# Patient Record
Sex: Female | Born: 1950 | Race: Black or African American | Hispanic: No | Marital: Married | Smoking: Former smoker
Health system: Southern US, Community
[De-identification: ages and names within clinical notes are randomized; demographics above are authoritative.]

## PROBLEM LIST (undated history)

## (undated) DIAGNOSIS — E785 Hyperlipidemia, unspecified: Secondary | ICD-10-CM

## (undated) DIAGNOSIS — C50912 Malignant neoplasm of unspecified site of left female breast: Secondary | ICD-10-CM

## (undated) DIAGNOSIS — C52 Malignant neoplasm of vagina: Secondary | ICD-10-CM

## (undated) DIAGNOSIS — I739 Peripheral vascular disease, unspecified: Secondary | ICD-10-CM

## (undated) DIAGNOSIS — Z720 Tobacco use: Secondary | ICD-10-CM

## (undated) DIAGNOSIS — I10 Essential (primary) hypertension: Secondary | ICD-10-CM

## (undated) DIAGNOSIS — Z86010 Personal history of colonic polyps: Principal | ICD-10-CM

## (undated) DIAGNOSIS — E0590 Thyrotoxicosis, unspecified without thyrotoxic crisis or storm: Secondary | ICD-10-CM

## (undated) DIAGNOSIS — E119 Type 2 diabetes mellitus without complications: Secondary | ICD-10-CM

## (undated) DIAGNOSIS — N183 Chronic kidney disease, stage 3 unspecified: Secondary | ICD-10-CM

## (undated) DIAGNOSIS — I779 Disorder of arteries and arterioles, unspecified: Secondary | ICD-10-CM

## (undated) DIAGNOSIS — E049 Nontoxic goiter, unspecified: Secondary | ICD-10-CM

## (undated) DIAGNOSIS — K219 Gastro-esophageal reflux disease without esophagitis: Secondary | ICD-10-CM

## (undated) DIAGNOSIS — I219 Acute myocardial infarction, unspecified: Secondary | ICD-10-CM

## (undated) DIAGNOSIS — N179 Acute kidney failure, unspecified: Secondary | ICD-10-CM

## (undated) DIAGNOSIS — Z87891 Personal history of nicotine dependence: Secondary | ICD-10-CM

## (undated) DIAGNOSIS — I2510 Atherosclerotic heart disease of native coronary artery without angina pectoris: Secondary | ICD-10-CM

## (undated) DIAGNOSIS — I4891 Unspecified atrial fibrillation: Secondary | ICD-10-CM

## (undated) DIAGNOSIS — Z951 Presence of aortocoronary bypass graft: Secondary | ICD-10-CM

## (undated) DIAGNOSIS — I150 Renovascular hypertension: Secondary | ICD-10-CM

## (undated) HISTORY — DX: Acute myocardial infarction, unspecified: I21.9

## (undated) HISTORY — DX: Type 2 diabetes mellitus without complications: E11.9

## (undated) HISTORY — DX: Presence of aortocoronary bypass graft: Z95.1

## (undated) HISTORY — DX: Malignant neoplasm of vagina: C52

## (undated) HISTORY — DX: Renovascular hypertension: I15.0

## (undated) HISTORY — DX: Personal history of nicotine dependence: Z87.891

## (undated) HISTORY — PX: TONSILLECTOMY: SUR1361

## (undated) HISTORY — DX: Atherosclerotic heart disease of native coronary artery without angina pectoris: I25.10

## (undated) HISTORY — DX: Nontoxic goiter, unspecified: E04.9

## (undated) HISTORY — DX: Personal history of colonic polyps: Z86.010

## (undated) HISTORY — DX: Acute kidney failure, unspecified: N17.9

## (undated) HISTORY — DX: Gastro-esophageal reflux disease without esophagitis: K21.9

## (undated) HISTORY — DX: Hyperlipidemia, unspecified: E78.5

## (undated) HISTORY — DX: Unspecified atrial fibrillation: I48.91

## (undated) HISTORY — DX: Peripheral vascular disease, unspecified: I73.9

## (undated) HISTORY — DX: Tobacco use: Z72.0

## (undated) HISTORY — DX: Chronic kidney disease, stage 3 (moderate): N18.3

## (undated) HISTORY — DX: Disorder of arteries and arterioles, unspecified: I77.9

## (undated) HISTORY — DX: Essential (primary) hypertension: I10

## (undated) HISTORY — DX: Chronic kidney disease, stage 3 unspecified: N18.30

## (undated) HISTORY — DX: Thyrotoxicosis, unspecified without thyrotoxic crisis or storm: E05.90

---

## 1981-07-02 DIAGNOSIS — C52 Malignant neoplasm of vagina: Secondary | ICD-10-CM

## 1981-07-02 HISTORY — DX: Malignant neoplasm of vagina: C52

## 1998-10-18 ENCOUNTER — Other Ambulatory Visit: Admission: RE | Admit: 1998-10-18 | Discharge: 1998-10-18 | Payer: Self-pay | Admitting: Internal Medicine

## 2000-07-02 DIAGNOSIS — I219 Acute myocardial infarction, unspecified: Secondary | ICD-10-CM

## 2000-07-02 HISTORY — DX: Acute myocardial infarction, unspecified: I21.9

## 2002-07-02 DIAGNOSIS — I2510 Atherosclerotic heart disease of native coronary artery without angina pectoris: Secondary | ICD-10-CM

## 2002-07-02 DIAGNOSIS — Z951 Presence of aortocoronary bypass graft: Secondary | ICD-10-CM

## 2002-07-02 HISTORY — DX: Presence of aortocoronary bypass graft: Z95.1

## 2002-07-02 HISTORY — DX: Atherosclerotic heart disease of native coronary artery without angina pectoris: I25.10

## 2002-09-22 ENCOUNTER — Inpatient Hospital Stay (HOSPITAL_COMMUNITY): Admission: EM | Admit: 2002-09-22 | Discharge: 2002-10-02 | Payer: Self-pay | Admitting: *Deleted

## 2002-09-23 HISTORY — PX: CARDIAC CATHETERIZATION: SHX172

## 2002-09-24 ENCOUNTER — Encounter: Payer: Self-pay | Admitting: Cardiothoracic Surgery

## 2002-09-24 HISTORY — PX: CORONARY ARTERY BYPASS GRAFT: SHX141

## 2002-09-25 ENCOUNTER — Encounter: Payer: Self-pay | Admitting: Cardiothoracic Surgery

## 2002-09-26 ENCOUNTER — Encounter: Payer: Self-pay | Admitting: Cardiothoracic Surgery

## 2002-09-27 ENCOUNTER — Encounter: Payer: Self-pay | Admitting: Surgery

## 2002-09-28 ENCOUNTER — Encounter: Payer: Self-pay | Admitting: Surgery

## 2002-09-29 ENCOUNTER — Encounter: Payer: Self-pay | Admitting: Cardiothoracic Surgery

## 2002-09-30 ENCOUNTER — Encounter: Payer: Self-pay | Admitting: Cardiothoracic Surgery

## 2002-10-01 ENCOUNTER — Encounter (INDEPENDENT_AMBULATORY_CARE_PROVIDER_SITE_OTHER): Payer: Self-pay | Admitting: Cardiology

## 2002-10-14 ENCOUNTER — Encounter: Payer: Self-pay | Admitting: Emergency Medicine

## 2002-10-15 ENCOUNTER — Encounter: Payer: Self-pay | Admitting: Emergency Medicine

## 2002-10-15 ENCOUNTER — Inpatient Hospital Stay (HOSPITAL_COMMUNITY): Admission: EM | Admit: 2002-10-15 | Discharge: 2002-10-16 | Payer: Self-pay | Admitting: Emergency Medicine

## 2002-10-15 ENCOUNTER — Encounter: Payer: Self-pay | Admitting: Neurology

## 2002-10-19 ENCOUNTER — Encounter (INDEPENDENT_AMBULATORY_CARE_PROVIDER_SITE_OTHER): Payer: Self-pay | Admitting: Specialist

## 2002-10-19 ENCOUNTER — Ambulatory Visit (HOSPITAL_COMMUNITY): Admission: RE | Admit: 2002-10-19 | Discharge: 2002-10-19 | Payer: Self-pay | Admitting: Oncology

## 2002-10-19 ENCOUNTER — Encounter: Payer: Self-pay | Admitting: Oncology

## 2003-02-11 ENCOUNTER — Encounter: Payer: Self-pay | Admitting: Cardiovascular Disease

## 2003-02-11 ENCOUNTER — Ambulatory Visit (HOSPITAL_COMMUNITY): Admission: RE | Admit: 2003-02-11 | Discharge: 2003-02-12 | Payer: Self-pay | Admitting: Cardiovascular Disease

## 2003-02-11 HISTORY — PX: RENAL ARTERY ANGIOPLASTY: SHX2317

## 2003-02-25 ENCOUNTER — Encounter: Payer: Self-pay | Admitting: Oncology

## 2003-02-25 ENCOUNTER — Ambulatory Visit (HOSPITAL_COMMUNITY): Admission: RE | Admit: 2003-02-25 | Discharge: 2003-02-25 | Payer: Self-pay | Admitting: Oncology

## 2003-04-13 ENCOUNTER — Ambulatory Visit: Admission: RE | Admit: 2003-04-13 | Discharge: 2003-04-13 | Payer: Self-pay | Admitting: Gynecologic Oncology

## 2003-08-17 ENCOUNTER — Ambulatory Visit (HOSPITAL_COMMUNITY): Admission: RE | Admit: 2003-08-17 | Discharge: 2003-08-18 | Payer: Self-pay | Admitting: Cardiovascular Disease

## 2003-12-20 ENCOUNTER — Encounter: Admission: RE | Admit: 2003-12-20 | Discharge: 2003-12-20 | Payer: Self-pay | Admitting: Cardiovascular Disease

## 2003-12-23 ENCOUNTER — Ambulatory Visit (HOSPITAL_COMMUNITY): Admission: RE | Admit: 2003-12-23 | Discharge: 2003-12-24 | Payer: Self-pay | Admitting: Cardiovascular Disease

## 2003-12-23 HISTORY — PX: CARDIAC CATHETERIZATION: SHX172

## 2008-10-12 ENCOUNTER — Emergency Department (HOSPITAL_COMMUNITY): Admission: EM | Admit: 2008-10-12 | Discharge: 2008-10-12 | Payer: Self-pay | Admitting: Emergency Medicine

## 2010-06-08 ENCOUNTER — Encounter
Admission: RE | Admit: 2010-06-08 | Discharge: 2010-06-08 | Payer: Self-pay | Source: Home / Self Care | Attending: Internal Medicine | Admitting: Internal Medicine

## 2010-07-05 ENCOUNTER — Encounter
Admission: RE | Admit: 2010-07-05 | Discharge: 2010-07-05 | Payer: Self-pay | Source: Home / Self Care | Attending: Internal Medicine | Admitting: Internal Medicine

## 2010-11-17 NOTE — Discharge Summary (Signed)
[name], [name]                           ACCOUNT NO.:  [id number]   MEDICAL RECORD NO.:  [id number]                   PATIENT TYPE:  INP   LOCATION:  3034                                 FACILITY:  [facility]   PHYSICIAN:  Alyson Locket. Love, M.D.                 DATE OF BIRTH:  1951/04/07   DATE OF ADMISSION:  10/14/2002  DATE OF DISCHARGE:  10/16/2002                                 DISCHARGE SUMMARY   REASON FOR ADMISSION:  This was one of several [facility] admission  for this 60 year old, left-handed, black, married female from [place],  [place], admitted from the emergency room for evaluation of slurred  speech and fixed high gaze.   HISTORY OF PRESENT ILLNESS:  The patient has a history of hypertension for  six years, hyperlipidemia, and was admitted 09/22/2002 with a two-week  history of chest pain and had an abnormal EKG.  Cardiac catheterization  showed evidence of three-vessel disease with 90 to 95% stenosis of all three  main coronary artery vessels.  She had preserved left ventricular function  with an ejection fraction of 55%.  Carotid Dopplers prior to surgery showed  a 60 to 80% left ICA stenosis but otherwise no significant stenosis.  She  did have moderate right femoral reduction of flow and mild reduction of  arterial flow in the left femoral.  She underwent three-vessel coronary  artery bypass surgery 09/24/2002 with left internal mammary artery to LAD,  saphenous vein graft to obtuse marginal, saphenous vein graft to right  coronary artery.  She had some postop bleeding complications.  She was  discharged home for cardiac rehabilitation and had previously quit  cigarettes in January 2004 and alcohol in January 2004.   At home on Sunday, the day prior to admission, she complained of some  decreased hearing of the right ear and tinnitus, but apparently this was an  old complaint.  On Monday, she was noted to have some slurred speech and  episodes in  which her eyes appeared to be glazed.  There was no associated  seizure activity o focal weakness.  She did complain of some shortness of  breath.  She had some coughing episodes and decreased appetite.  She was  brought to the emergency room late on the evening of 10/14/2002 and admitted.  CT scan showed evidence of old bicerebral small vessel ischemic strokes, and  neurology was asked to see.   PAST MEDICAL HISTORY:  1. Hypertension for six years.  2. Hyperlipidemia.  3. Peripheral vascular disease with claudication in the right leg.  4. Left ICA stenosis of 60 to 80% documented by preop Dopplers.  5. Right renal artery stenosis.  6. History of cervical cancer, though initially the family had told me     ovarian cancer.  7. Status post partial hysterectomy.  8. COPD.   MEDICATIONS:  1. Her medications had  been changed recently, and a Catapres patch had been     started on Saturday, a TTS 0.2 mg.  2. Zocor 40 mg daily.  3. Lasix 40 mg daily.  4. K-Dur 20 mEq daily.  5. Nexium 40 mg daily.  6. Albuterol inhaler 2 puffs 4 times a day.  7. Lopressor 25 mg 1/2 t.i.d.  8. Altace 5 mg daily.  9. Aspirin 325 mg daily.  10.      Plavix 75 mg daily.   ALLERGIES:  No known history of allergies.   SOCIAL HISTORY:  She was educated through high school but has been slow  according to her sister who helped her through most of her educational needs  in high school.  The patient has been working in [employer] in  [employer].  She stopped cigarettes in January 2004 an  alcohol in January 2004.  She was smoking one-half pack per day cigarettes  before she stopped.  The amount to alcohol is not clear but at least one  beer per day according to her husband.  This is her second marriage.   PHYSICAL EXAMINATION ON ADMISSION:  VITAL SIGNS:  Normal.  NEUROLOGIC:  She was alert but seemed distant.  She would give her name.  She was oriented x 3 but initially said the  year was 7.  She knew the  president but not the vice president or governor.  She followed commands.  Cranial nerve examination was normal.  She did have a retinal examination,  bilateral cotton wool exudates.  There was no focal asymmetry, and hearing  was present with air conduction greater than bone conduction.  Tongue was  midline, and gags were present.  Motor examination revealed that she moved  all extremities, and sensory examination was intact.  Deep tendon reflexes  were 2+ with absent ankle jerks, and plantar responses were downgoing.  GENERAL:  Unremarkable.  LUNGS:  Decreased breath sounds at both bases.  CARDIOVASCULAR:  Heart sounds normal without a murmur.  ABDOMEN:  I could not palpate initially the mass in the left pelvis, but  this could be palpated through the abdominal wall on the left side.   LABORATORY DATA:  Sodium 138, potassium 3.8, chloride 103, CO2 content 28,  BUN 6, creatinine 0.8, glucose 118.  White blood cell count 6800, hemoglobin  9.5, hematocrit 29.0, platelets 532,000.  Pro time 16.4, INR 1.4.  SGOT  elevated at 115 with an SGPT of 92.  Sodium 135, potassium 3.9, chloride  106, CO2 content 26, BUN 6, creatinine 0.7, and glucose 121 on repeat.  Urinalysis showed 3 to 6 white blood cells and 0.2 red blood cells.   CT scan of the brain showed multiple small vessel disease ischemic strokes.   EKG showed normal sinus rhythm with low-voltage QRS, prolonged QT, and some  question of inferior ischemia.   Repeat pro time in hospital revealed an INR of 1.4, PTT 36.  Ammonia level  was 14.   A 2-D echocardiogram showed left ventricular size normal, overall left  ventricular function at lower limits of normal, left ventricular ejection  fraction estimated at 50 to 55%.  There was mild mitral valvular  regurgitation.  There was a small left pleural effusion.  CT scan of the abdomen and pelvis was remarkable for bilateral pleural  effusions and thickening  of the pericardium which could be related to her  CABG.  The liver was inhomogeneous which was worrisome for diffuse  hepatocellular disease and even the possibility of metastatic disease.  There was a large mass in the left retroperitoneal region, possibly  representing adenopathy and a small amount of pelvic ascites.   Chest x-ray showed cardiomegaly, mild bilateral edema, bilateral basilar  atelectasis and/or infiltrate.   Portable chest x-ray showed improved congestive heart failure, small  effusion, and basilar atelectasis.   Telemetry showed normal sinus rhythm in hospital.   HOSPITAL COURSE:  The patient was seen in the emergency room, history of  Catapres patch placement begun on Saturday was noted, and this was  discontinued.  History was possible for transient ischemic attack or stroke,  but also quite possible for Catapres reaction.  CT scan of the abdomen and  pelvis to look at the elevated liver function tests revealed the left pelvic  mass, and the patient was seen in consultation by the interventional  radiology and also by Dr. Julieanne Manson, hematology/oncology.  Although  initial history had been from the family of that of an ovarian cancer, it  was determined that she most likely had a cervical cancer.  It was decided  that she should stop her aspirin and Plavix and undergo biopsy of this  lesion on Monday, 10/19/2002.  Clonidine was held, and she returned to normal  according to her family.   IMPRESSION:  1. Dysarthria (Code 784.5).  2. Confusion (Code 298.9).  3. Bicerebral small vessel ischemic strokes on CT scan (Code 433.31).  4. Left internal carotid artery stenosis, 60 to 80% on Doppler studies (Code     433.10).  5. Peripheral vascular disease with right leg claudication (Code 440.21).  6. Hypertension (Code 796, 2).  7. Hyperlipidemia (Code 273.4).  8. Chronic obstructive pulmonary disease with recent discontinuation of     cigarettes in January 2004  (Code 496).  9. Right renal artery stenosis.  10.      Elevated liver function tests (Code unknown).  11.      Pelvic mass, rule out lymphoma, Hodgkin's disease, or recurrence of     cervical cancer.  12.      History of cervical cancer.   DISCHARGE MEDICATIONS:  1. Zocor 40 mg daily.  2. Lasix 40 mg daily.  3. K-Dur 20 mEq daily.  4. Nexium 40 mg daily.  5. Albuterol inhaler 2 puffs four times a day.  6. Lopressor 25 mg 1/2 tablet t.i.d.  7. Altace 5 mg daily.   She is not to be on any aspirin or Plavix until after biopsy 10/20/2002.   DIET:  Low sodium.   ACTIVITY:  She is not to drive a car.   FOLLOW UP:  She is to return to see me in one month and to see Dr. Benay Spice  based on biopsy results.                                               Alyson Locket. Erling Cruz, M.D.    JML/MEDQ  D:  10/16/2002  T:  10/16/2002  Job:  [id number]   cc:   Almira Coaster. Fischer, M.D. [address]  [place]  [place]  [phone]  Fax: [phone]   Richard A. Rollene Fare, M.D.  [phone] [address] [place] [address]  Fax: [place] Benay Spice, M.D.  [place]. [place]  [place]  [phone]  Fax: [phone]

## 2010-11-17 NOTE — Discharge Summary (Signed)
[name], [name] NO.:  [id number]   MEDICAL RECORD NO.:  [id number]                   PATIENT TYPE:  INP   LOCATION:  2034                                 FACILITY:  [facility]   PHYSICIAN:  Ivin Poot, M.D.               DATE OF BIRTH:  27-Sep-1950   DATE OF ADMISSION:  09/22/2002  DATE OF DISCHARGE:  10/02/2002                                 DISCHARGE SUMMARY   PRIMARY ADMITTING DIAGNOSIS:  Chest pain.   ADDITIONAL DISCHARGE DIAGNOSES:  1. Severe three vessel coronary artery disease.  2. Unstable angina.  3. Hypertension.  4. Hyperlipidemia.  5. Peripheral vascular disease and claudication.  6. Right renal artery stenosis.  7. History of tobacco abuse.  8. Postoperative bleeding.  9. Postoperative anemia.   PROCEDURES PERFORMED:  1. Cardiac catheterization.  2. Coronary artery bypass grafting x3 (left internal mammary artery to the     LAD, saphenous vein graft to the circumflex, saphenous vein graft to the     PDA).  3. Endoscopic vein harvest, right thigh, with open vein from his right lower     leg.  4. Placement of intra-aortic balloon pump intraoperatively.  5. Mediastinal reexploration for postoperative bleeding.   HISTORY:  The patient is a 60 year old black female with a history of  hypertension who over the past several weeks prior to this admission has had  intermittent episodes of chest pain.  She was referred by her primary care  physician to Dr. Terance Ice for further evaluation.  She initially  was scheduled for a stress test, but due to an abnormal EKG this was changed  to a Persantine Cardiolite study.  She developed tightness in her face and  jaw, as well as her chest, during this study with signs of ST depression.  She continued to have pain; therefore, she was given nitroglycerin which  resolved her pain immediately.  Because of her abnormal test, she was felt  to warrant cardiac catheterization and was  admitted to [facility]  to proceed with the same.   HOSPITAL COURSE:  She was started on IV heparin and nitroglycerin and  remained pain free.  She was taken to the cath lab on 09/23/2002, where she  underwent cardiac catheterization.  This showed significant three vessel  coronary artery disease.  Due to the diffuse nature of her disease and her  small target vessels, it was felt that she would be a good candidate for  percutaneous intervention.  She was referred for cardiothoracic surgery  consultation.  She was seen by Dr. Tharon Aquas Trigt, and he was in agreement  that she should proceed with surgical revascularization.  She remained  stable and was taken to the operating room on 09/24/2002, where she underwent  CABG x3 with the above-noted grafts.  She tolerated the procedure well and  was weaned off cardiopulmonary bypass without difficulty;  however, at the  completion of the procedure while her chest was being closed, her blood  pressure decreased and hovered around the 50 range systolic despite  aggressive therapy.  Her chest was reopened, and she was emergently placed  back on cardiopulmonary bypass.  An intra-aortic balloon pump was also  placed.  Her grafts were reexamined, and all were found to be in good  condition.  She was able to be weaned off bypass the second time with stable  cardiac output.  She had no EKG changes during this time period.  She was  taken to the SICU in stable condition.  Postoperatively, she was noted to  have increased drainage from her chest tube from between 200 to 250 mL per  hour.  Her initial coagulation studies were mildly abnormal.  Chest x-ray  showed some mediastinal widening.  It was felt that she should be returned  to the operating room for exploration.  Upon exploration, she was noted to  have a bleeding vessel in the pericardial fat, as well as some intermittent  bleeding from the chest wall.  All these areas were cauterized, and   hemostasis was achieved.  Her grafts were again reexamined, and all the  anastomoses were hemostatic.  She was transfused two units of packed red  blood cells intraoperatively for a hemoglobin of 7.0.  Once again, she was  transferred to the SICU at completion of the procedure in stable condition.  She was initially maintained on __________ and dopamine drips.  She remained  hemodynamically stable, and late on the day of postoperative day #2, she was  weaned from the ventilator and extubated.  Also her drips were weaned and  discontinued.  Late in the day on postoperative day #2, her intra-aortic  balloon pump was also able to be removed.  She was noted to be quite volume  overloaded and was started on diuresis.  Her chest tubes were removed by  postoperative day #3, and she was ready to be transferred to the floor.  She  has otherwise done well postoperatively.  She complained of some mild  difficulty swallowing and underwent and esophagram, as well as an evaluation  by speech pathology.  She had mild esophageal dysmotility, and otherwise was  negative.  Her dysphagia was resolved at the present, and she is tolerating  a regular diet without difficulty.  She has also been working with physical  therapy, cardiac rehab phase I on ambulation, and was doing quite well.  Also in the initial postoperative period, she was started on Cipro for a  cough and fever of 101.  This was resolved.  Her chest x-ray most recently  has shown only atelectasis.  She was completing a course of Cipro by  10/02/2002, and her cough was improved.  Her surgical incision sites were all  healing well.  Her blood pressure has remained fairly stable after the  addition of Altace to her regimen.  She has also been started on Zocor for  hyperlipidemia.  She is still quite volume overloaded, and presently at  least 15 pounds above her preoperative weight with significant lower extremity edema.  She is continuing to diurese  well, and this will need to  be continued in the outpatient setting.  It is felt that if she continues to  remain stable, she will be ready for discharge home on 10/02/2002.   DISCHARGE MEDICATIONS:  1. Enteric-coated aspirin 325 mg daily.  2. Lopressor 25 mg t.i.d.  3. Altace 5 mg daily.  4. Catapres TTS patch 0.4 mg per 24 hours q 7 days.  5. Zocor 40 mg daily.  6. Lasix 40 mg daily x2 weeks.  7. K-Dur 20 mEq daily x2 weeks.  8. Nexium 40 mg daily.  9. Ultram 50 to 100 mg q.4h. p.r.n. for pain.   DISCHARGE INSTRUCTIONS:  She is to refrain from driving, heavy lifting, or  strenuous activity.  She may continue a low-fat, low-sodium diet.  She is  asked to shower daily and clean her incisions with soap and water.  Home  health nursing has been arranged for cardiac restorative care, and to remove  her staples in one week.    DISCHARGE FOLLOW UP:  She was asked to make an appointment to see Dr.  Rollene Fare in two weeks.  She will then follow up with Dr. Prescott Gum on  Friday, 10/30/2002, at 11:45 a.m.  She is asked to have a chest x-ray at  [facility] one hour prior the this appointment, and to  bring her films to the office visit.  She is asked to call if she  experiences any problems or has questions in the interim.     Suzzanne Cloud, P.A.                        Ivin Poot, M.D.    GC/MEDQ  D:  10/01/2002  T:  10/03/2002  Job:  [id number]   cc:   Delfino Lovett A. Rollene Fare, M.D.  [phone] [address]  [place]  [place] [address]  Fax: [place], [place]

## 2010-11-17 NOTE — H&P (Signed)
[name], [name]                           ACCOUNT NO.:  [id number]   MEDICAL RECORD NO.:  [id number]                   PATIENT TYPE:  INP   LOCATION:  3034                                 FACILITY:  [facility]   PHYSICIAN:  Alyson Locket. Love, M.D.                 DATE OF BIRTH:  10-01-1950   DATE OF ADMISSION:  10/15/2002  DATE OF DISCHARGE:                                HISTORY & PHYSICAL   REASON FOR ADMISSION:  This is one of several [facility] admissions  for this 60 year old left-handed black married female from [place], [place], admitted from the emergency room for evaluation of slurred speech  and fixed eye gaze.   HISTORY OF PRESENT ILLNESS:  The patient has a history of hypertension for  six years, elevated lipids in the past, and was admitted September 22, 2002 with  a two-week history of chest pain to the cardiology service.  She had an  abnormal EKG at that time and underwent cardiac catheterization showing  three-vessel disease with 90-95% stenosis of all three main coronary artery  vessels, with preserved LV function by 2-D echocardiogram, ejection  fractions of 55%.  She also had Doppler studies of her peripheral vascular  system showing moderate right femoral reduction of flow and mild reduction  of arterial flow in the left femoral.  She had Doppler studies of the left  internal carotid artery showing 60-80% stenosis and other vessels looking  well.  She underwent three-vessel coronary artery bypass surgery, September 24, 2002, with internal mammary artery to her LAD, saphenous vein graft to the  circumflex obtuse marginal, and saphenous vein graft to right coronary  artery.  She was discharged home for cardiac rehab and had previously quit  cigarettes, January of 2004, and alcohol in January of 2004, prior to that  admission.  At home, she was doing very well, walking, getting around and  doing well and suddenly complained of decreased hearing out of her  right ear  and tinnitus; apparently, this is an old complaint.  Monday, she was noted  to have slurred speech and has had episodes of appearing eye-glazed with her  eyes since that time.  There has been no focal weakness or seizure noted.  She has been up walking.  She has required some assistance in taking care of  her toilet needs and dressing herself.  She has complained of shortness of  breath and pain in her back with coughing.  She has had decreased appetite  and been agitated at night.  She was brought to the emergency room, where a  CT scan showed bicerebral small vessel ischemic strokes and neurology was  asked to see her.  She was not taking her Lopressor at home, and this was  restarted on Saturday, and she was not taking her Catapres patch TTS-2,  which was also started on Saturday.  PAST MEDICAL HISTORY:  Her past medical history is significant for  hypertension for six years, hyperlipidemia, peripheral vascular disease with  claudication in her right leg, left internal carotid artery 60-80% stenosis,  right renal artery stenosis, history of ovarian cancer, status post partial  hysterectomy, and COPD.   MEDICATIONS:  1. Catapres patch TTS 0.2 mg placed on Saturday for the first time, having     not taken it for over a week.  2. Zocor 40 mg daily.  3. Lasix 40 mg daily.  4. K-Dur 20 mEq daily.  5. Nexium 40 mg daily.  6. Albuterol inhaler two puffs four times per day.  7. Lopressor 25 mg a half b.i.d. to a half t.i.d.  8. Altace 5 mg daily.  9. Aspirin 325 mg daily.  10.      Plavix 75 mg daily.   ALLERGIES:  She has no known history of allergies.   EDUCATION:  She finished high school but was a slow learner and her sister  did most of her work in school, according to the sister.   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  Cigarettes she stopped in  January of 2004; she smoked a half a  pack per day before that time.  Alcohol  she stopped in January of 2004 and had approximately a beer per day prior to  that time.   FAMILY HISTORY:  Her father died at 29 from cancer.  Mother died at 23.  She  has sisters 57 and 75 and a brother 63, living and well.  She has two  children, sons, 104 and 6, living and well.   PAST SURGICAL HISTORY:  Operations in the past have included ovarian cancer,  operation greater than 10 years ago, a partial hysterectomy greater than 10  years ago.  She has had no other hospitalizations and no other history of  injuries.   PHYSICAL EXAMINATION:  GENERAL/VITAL SIGNS :  Examination revealed a well-  developed black female with blood pressures in right and left arms of  120/60, heart rate 75 and temperature was 98.6.  There were no bruits.  She  was afebrile.  NEUROLOGIC:  Mental status:  She seemed distant.  She would give her name.  She is oriented x3, except she initially stated the year was 35.  She knew  the president but not the vice president or governor.  She followed  commands.  Her cranial nerve examination revealed visual fields full.  She  had bilateral cotton-wool exudates in her eyes.  Her disks were flat.  Her  extraocular movements were full.  The pupils reacted from 4 to 3  bilaterally.  Corneals were present and facial sensation was equal.  There  was no facial motor asymmetry.  Hearing was present, air conduction greater  than bone conduction.  Tongue midline.  Uvula midline.  Gags were present.  Sternocleidomastoid and trapezius testing normal.  Motor examination  revealed she moved all extremities.  There was no drift.  She had sensory  examination intact to pinprick, light touch, joint position and vibration  but poor two-point discrimination in her hands.  Deep tendon reflexes were  2+ with absent ankle jerks and plantar responses downgoing. HEENT:  Examination revealed the tympanic membranes to be clear.  The mouth  was in  good repair.  LUNGS:  The lungs revealed decreased breath sounds at the bases.  HEART:  The heart was without murmurs.  ABDOMEN:  Bowel sounds were normal.  There was no enlargement of the liver  or spleen or kidneys.   LABORATORY DATA:  Initial sodium 138, potassium 3.8, chloride 103, CO2  content 28, BUN 6, creatinine 0.8, glucose 118.  White blood cell count  6800, hemoglobin 9.5, hematocrit 29.0, platelets 532,000.  Pro time 16.4  with an INR of 1.4.  Sodium 135, potassium 3.9, chloride 106, CO2 content  26, BUN 6, creatinine 0.7, glucose 121 on another set of electrolytes.  SGOT  elevated at 115, SGPT elevated at 92.  Urinalysis with 3 to 6 white blood  cells, 0 to 2 red blood cells.   CT scan showed multiple small vessel disease and ischemic strokes.   EKG showed normal sinus rhythm, low-voltage QRS and prolonged Q-T; T waves  raised the question of inferior and anterior ischemia.   IMPRESSION:  1. Confusion, code 298.9.  2. Slurred speech, code 784.5.  This may represent a metabolic problem     rather than stroke, because of elevated liver function tests and also the     recent placement of a Catapres patch which coincided the symptoms.  3. Bicerebral small vessel ischemic strokes on  CT scan, code 433.31.  4. Left internal carotid artery 60-80% stenosis, code 433.10.  5. Peripheral vascular disease with right leg claudication, code 440.21.  6. Hypertension, code 796.2.  7. Hyperlipidemia, code 272.4.  8. Chronic obstructive pulmonary disease, code 496.  9. Right renal artery stenosis.   PLAN:  Plan at this time is to obtain a serial ammonia, discontinue Catapres  patch and obtain a CT scan of the abdomen and pelvis.                                               Alyson Locket. Erling Cruz, M.D.    JML/MEDQ  D:  10/15/2002  T:  10/15/2002  Job:  [id number]

## 2010-11-17 NOTE — Op Note (Signed)
NAMESYLAR, JANSEN NO.:  [id number]   MEDICAL RECORD NO.:  [id number]                   PATIENT TYPE:  INP   LOCATION:  2307                                 FACILITY:  [facility]   PHYSICIAN:  Ivin Poot III, M.D.           DATE OF BIRTH:  July 31, 1950   DATE OF PROCEDURE:  09/25/2002  DATE OF DISCHARGE:                                 OPERATIVE REPORT   OPERATION:  Mediastinal re-exploration for bleeding, standpoint coronary  artery bypass graft x3.   PREOPERATIVE DIAGNOSIS:  Mediastinal bleeding following coronary artery  bypass graft x3.   POSTOPERATIVE DIAGNOSIS:  Mediastinal bleeding following coronary artery  bypass graft x3.   SURGEON:  Ivin Poot, M.D.   ANESTHESIA:  General.   INDICATIONS:  The patient is a 60 year old black female who on the evening  prior to  this operation underwent surgical revascularization for severe 3-  vessel coronary artery disease with marginal target vessels for grafting.  She required an intra-aortic balloon pump placement after experiencing  hemodynamic instability post bypass. When she returned to the intensive care  unit she was hemodynamically stable with minimal chest tube output.   However, after midnight her chest tube output increased to 200 to 250 cc an  hour on 3 consecutive hours without evidence of improving, and she was  returned to the operating room for re-exploration. The patient's family was  notified of the return to the operating room and the reasons for the re-  exploration.   DESCRIPTION OF PROCEDURE:  The patient was brought directly from the  intensive care unit to the operating room where she was again prepped and  draped and the previous sternal incision was opened. There was a moderate  amount of clotted and nonclotted blood in the mediastinum, especially on the  lateral aspect of the pericardium between the pericardium and the hilum of  the left lung.   There was  hematoma as well as bloody pleural effusion of the left pleural  space. The chest wall had some bleeding from a sternal wire at the area of  the xiphoid process. This was controlled with a transfixion suture of  Vicryl. There was some diffuse oozing in the mediastinal fat which was  controlled with electrocautery.   The anastomoses were all hemostatic. The bypass grafts were all patent. The  cannulation sites were hemostatic. The pericardial opening through which the  mammary artery pedicle was placed had some bleeding from a small vessel  which was cauterized and clipped. This was probably responsible for the  bleeding and the hematoma in the left side of the pericardium.   After checking again for any other areas of active bleeding and documenting  that hemostasis was adequate, the mediastinum was irrigated with warm  antibiotic irrigation and new chest tubes were placed and brought out  through  separate incisions. The pericardium was loosely reapproximated and the  sternum was reapproximated. The pectoralis fascia was closed with  interrupted #1 Vicryl and the subcutaneous and skin were closed with a  running Vicryl. Hemodynamics remained stable during chest closure. She was  returned to the ICU in stable condition.                                               Len Childs, M.D.    PV/MEDQ  D:  09/25/2002  T:  09/26/2002  Job:  [id number]

## 2010-11-17 NOTE — Consult Note (Signed)
[name], [name]                           ACCOUNT NO.:  [id number]   MEDICAL RECORD NO.:  [id number]                   PATIENT TYPE:  OUT   LOCATION:  GYN                                  FACILITY:  [facility]   PHYSICIAN:  John T. Clarene Essex, M.D.                 DATE OF BIRTH:  1951-01-24   DATE OF CONSULTATION:  04/13/2003  DATE OF DISCHARGE:                                   CONSULTATION   CHIEF COMPLAINT:  Pelvic cyst in the setting of prior hysterectomy.   HISTORY OF PRESENT ILLNESS:  The patient gives a history of partial  hysterectomy for early invasive or preinvasive cervical disease in the  1980's. Ovaries were retained and she relates no menopausal symptoms. She  did not receive a radical hysterectomy and did not receive any postoperative  radiation treatment. She had normal Pap smears through followup in [place]  where her surgery had been performed. She was hospitalized in April 2004 and  was found to have a cystic pelvic mass on CT scan. Cyst aspiration October 20, 2002 revealed benign cyst fluid.  Followup CT scan in August 2004 and an  ultrasound for evaluation of a right iliac stent revealed small persistent  left pelvic mass, approximately 3 1/2 x 4 1/2 cm on ultrasound. CT report  suggests that this has decreased in size from 5.0 x 6.5 cm to 5.7 x 3.6 cm.  There was no ascites or definitive perineal soft tissue implants. Of  interest, the radiologist noted that this most likely reflected local  recurrence of ovarian cancer as they had a history of ovarian cancer  registered into their system. The patient is concerned regarding the  etiology of her mass. She is currently asymptomatic with no pelvic pain or  dyspareunia. She denies unilateral back pain or leg swelling.   PAST MEDICAL HISTORY:  Complicated for severe atherosclerotic disease  including coronary artery disease, hypertension, renal artery stenosis,  external iliac stenosis. She is status post multivessel  CABG in March 2004  and has 95% right inferior renal artery stenosis treated with stent. She has  a history of peptic ulcer disease and is post hysterectomy as above.   CURRENT MEDICATIONS:  Lasix, Nexium, Altace, clonidine, Crestor, Plavix,  diclofenac, potassium supplementation, aspirin and Tylenol.   ALLERGIES:  None.   SOCIAL HISTORY:  Prior heavy smoker, currently nonsmoking. Married.   FAMILY HISTORY:  Noncontributory with ovarian cancer.   REVIEW OF SYMPTOMS:  Otherwise noncontributory, with no change in bowel or  bladder function and no other gynecologic or pelvic symptoms.   PHYSICAL EXAMINATION:  VITAL SIGNS:  Weight 175 pounds, blood pressure  162/92, pulse 72, respirations 18.  GENERAL:  The patient is alert, anxious and oriented x3 in no acute  distress. HEENT:  There is no pathologic lymphadenopathy.  LUNGS:  Fields are clear.  CHEST:  Chest wall has a midline  sternotomy incision that is well healed.  ABDOMEN:  Soft and benign with well healed small Pfannenstiel incision,  without hernia, ascites, mass or tenderness.  EXTREMITIES:  Scars from prior saphenous vein harvesting. There are  diminished pulses in the DP.  PELVIC:  External genitalia and BUS are normal to inspection and palpation.  The vagina is well supported with no significant cystocele or rectocele and  there are no vaginal lesions. Bimanual and rectovaginal examinations reveal  absent uterus and cervix. There is no adnexal or cul-de-sac mass palpable  although the patient guards voluntarily because of anal discomfort on  rectovaginal examination. There is certainly no cul-de-sac nodularity.   LABORATORY DATA:  Reviewed as outlined above. Of note, the CT description of  the mass describes it as a tubular structure.   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 Multiple medical comorbidities as outlined above.   RECOMMENDATIONS:  I would recommend that the patient be followed with either  pelvic ultrasound or CT scan at annual intervals and would not surgically  intervene unless the patient had a significant enlargement of this mass or  were developing significant symptoms such as pain. The development of a  significant solid component of the mass or changing morphology that would  suggest malignancy would also be a reason for exploration. Given her myriad  of physicians, I believe that this would best be managed through her primary  care physician. We would be glad to see her back at any time on a p.r.n.  basis.                                               John T. Clarene Essex, M.D.    JTS/MEDQ  D:  04/13/2003  T:  04/13/2003  Job:  [id number]   cc:   [place]. Rollene Fare, M.D.  [phone] [address], [address] [place] [address]  Fax: [place] Love, M.D.  [address] [place] [address]  Fax: Irene Benay Spice, M.D.  [place]. [place]  [phone]  Fax: [phone]   Caswell Corwin, R.N.  [phone] [address], [place] [address]

## 2010-11-17 NOTE — Cardiovascular Report (Signed)
[name], [name]                           ACCOUNT NO.:  [id number]   MEDICAL RECORD NO.:  [id number]                   PATIENT TYPE:  OIB   LOCATION:  6523                                 FACILITY:  [facility]   PHYSICIAN:  Richard A. Rollene Fare, M.D.          DATE OF BIRTH:  06/06/1951   DATE OF PROCEDURE:  02/11/2003  DATE OF DISCHARGE:                              CARDIAC CATHETERIZATION   PROCEDURE:  Retrograde abdominal aortic catheterization, abdominal aortic  angiogram midstream PA projection, bilateral iliac angiography PA  projection, selective right renal angiogram anterior and posterior division  accessory renal arteries, PTA and stent high grade posterior division  (inferior) right renal artery.   BRIEF HISTORY:  [name] is a 60 year old African-American married mother  of two with one grandchild.  She was a heavy smoker up until her recent  multivessel coronary artery bypass graft by Dr. Nadean Corwin September 24, 2002 for  severe three-vessel coronary disease with well preserved LV function.  At  that time, she had significant systemic hypertension as well and was found  to have a 95% right accessory inferior renal artery stenosis (posterior  division).  She has been followed on medical therapy and treated for  hypertension since her surgery.  She has discontinued smoking.  She has had  significant musculoskeletal discomfort but this is slowly improving.  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  She has  hyperlipidemia.  Found to have a pelvic mass which turned out to be a cyst  on evaluation including CT.  She is to follow up with Dr. Julieanne Manson  about this.  She has had prior elevated LFTs, on Zocor for hyperlipidemia.  LFTs were normal.  As an outpatient, at some point she was  switched to  Crestor and Niaspan was started and now she has elevated enzymes of three to  four times normal and her Crestor and Niaspan are currently being held.  She  is  asymptomatic with this without any significant symptoms of myopathy.  There was no history of recent or remote hepatitis.  She has had significant  exacerbation of her hypertension, however, prompting admission for  angiography and possible renal intervention for severe renal vascular  hypertension.   Informed consent was obtained to proceed and she was brought to the sixth  floor PV lab in postabsorptive state.  She was hydrated preoperatively.  Creatinine was 0.9, BUN 11.  Alkaline phosphatase 118, SGOT 118, SGPT 155.  CBC and diff, coags normal.   The right coronary artery was prepped, draped in the usual manner.  1%  Xylocaine was used for local anesthesia and the CRFA was entered with a  single anterior puncture using an 18 thin wall needle.  A 6 French short  sidearm sheath was inserted.  Exchange was done over 0.035-inch Wholey wire.  Abdominal aortic angiogram was done with a 5 [place] pigtail  catheter above  the level of the renal arteries at 20 ml, 20 ml per second.  A second  injection was done above the iliac bifurcation at the same setting.  The  patient was given 10 mg of Labetalol for significant systemic hypertension  with blood pressure of 220 mmHg.  Selective right renal angiography was done  at both the anterior and posterior division using a 5 French diagnostic  short right coronary catheter.   The patient was in sinus rhythm during the study.  She was given 3500 units  of heparin.  She was given 400 mcg of intra-arterial nitroglycerin.  Her  systolic pressure came down to 170 to 180 range.  Remained in sinus rhythm  and was stable.  Abdominal aortic angiogram revealed a patent celiac and SMA  axis.   The left renal artery was single and had no significant stenosis.   The superior right renal artery had 20% narrowing with good flow and was  approximately a 5 mm vessel.   The inferior renal artery was approximately a 4 mm vessel and had 95%  nonostial and proximal  mildly segmental stenosis similar to prior  angiography.  There was mild infrarenal atherosclerotic disease, but no  aneurysm or stenosis.  The iliac bifurcation was widely patent.  Common  iliacs were normal although short.  Hypogastrics were intact and the  external iliacs were long and widely patent bilaterally.  The SFA profunda  junctions were normal bilaterally.   The right renal artery was selectively catheterized with a diagnostic 6  French catheter and the posterior division (inferior branch).  The lesion  was crossed with a 0.014-inch stabilizer guide wire which was free in the  distal vessel and observed fluoroscopically throughout the procedure.   The lesion was dilated and stented with a 4 mm x 12-mm Genesis balloon  expandable stent premounted on a Cordis aviator balloon.  It was positioned  fluoroscopically just at the ostia and actually ended up about 1-2 mm inside  the ostia on multiple projections including oblique.  It was initially  dilated at 70-30.  The balloon was pulled back and dilated at 10-30 and  repeated 12-30 across the ostia.   The procedure was done through a 6 [place] short right 4 guiding catheter  which was exchanged over the stabilizer guide wire for the diagnostic  catheter.  The dilatation system was removed and final injections were done  through the guiding catheter.  This showed stenosis reduction of 90-95% to  0%.  There was approximately 10% narrowing just beyond the ostia and nipple  to the artery proximal to the stent, but this was widely patent with no  dissection in the stented area which had the high grade was widely patent  with 0% residual stenosis.  There was good flow to both renal arteries.  The  dilatation system was removed.  The side arm sheath was flushed.  Labetalol  total of 20 mg IV was given during the procedure to control blood pressure. He was transferred to the holding area for ACT measurement, subsequent  sheath removal and  pressure hemostasis in stable condition.   We will plan to keep her overnight to follow up renal function.  We will  hold her Niaspan and Crestor.  She has a followup with Dr. Julieanne Manson  about her past pelvic mass which they believe  was cystic nonmalignant  mass in the past evaluation.   We will get hepatitis serologies to rule out  any other causes of elevated  LFTs, keep her off Niaspan, Crestor at present.  If her LFTs normalize she  can be considered for another trial of Zocor therapy since she tolerated  this in the past.  If not, then she may need GI and hepatology consultation.   Continued medical therapy of her hypertension.  Hopefully, this will improve  post intervention.   CATHETERIZATION DIAGNOSES:  1. Severe hypertension, recent re-exacerbation with known right inferior     renal artery stenosis and renal vascular hypertension.  2. Successful percutaneous transluminal angioplasty and stent inferior     accessory right renal artery.  3. Prior coronary artery bypass graft x3 Dr. Tharon Aquas Trigt September 25, 2002     for severe three-vessel calcific disease with well preserved left     ventricular function.  4. Chronic obstructive pulmonary disease.  5. Past cigarette abuse.  6. Pelvic mass.  Evaluation by Dr. Benay Spice felt to be nonmalignant cyst.  7. Hyperlipidemia.  8. Arthritis.  9. Musculoskeletal chest pain postoperatively.  10.      History of bradycardia on clonidine and past Catapres reaction     resolved.  11.      Negative followup Cardiolite January 22, 2003.                                                  Richard A. Rollene Fare, M.D.    RAW/MEDQ  D:  02/11/2003  T:  02/12/2003  Job:  [id number]   cc:   Gwenlyn Perking, M.D.  [address]  [place]  [place] [address]  Fax: [place]. Benay Spice, M.D.  [place]. [place]  [phone]  Fax: [place] Love, M.D.  [address] [place] [address]   Fax: [phone]   Len Childs, M.D.  [address]  [place]  [place] [address]  Fax: [phone]

## 2010-11-17 NOTE — Consult Note (Signed)
Elaine, [name]                           ACCOUNT NO.:  [id number]   MEDICAL RECORD NO.:  [id number]                   PATIENT TYPE:  INP   LOCATION:  3034                                 FACILITY:  [facility]   PHYSICIAN:  Izola Price. Benay Spice, M.D.              DATE OF BIRTH:  10/09/1950   DATE OF CONSULTATION:  10/15/2002  DATE OF DISCHARGE:  10/16/2002                                   CONSULTATION   REASON FOR CONSULTATION:  Pelvic mass.   REFERRING PHYSICIAN:  Alyson Locket. Love, M.D.   HISTORY OF PRESENT ILLNESS:  The patient is a 60 year old woman with history  of CAD status post CABG September 24, 2002, hypertension, PVD, hyperlipidemia,  right renal artery stenosis, history of ETOH and tobacco abuse and  postoperative anemia admitted October 15, 2002, with slurred speech and  confusion.  Brain CT showed small vessel chronic ischemic changes, deep  cerebral white matter and age indeterminate lacunar infarct left external  capsule.  Admission lab work showed a hemoglobin of 9.5, white blood cell  count 6.8, platelet count ___________. PT 16.4.  SGOT 115, SGPT 92, total  protein 6.5, albumin 3.0, calcium 8.2.  Abdominal/pelvic CT demonstrated a  mass in the left retroperineum extending to the left iliac lymph node chain  measuring approximately 7 cm, inhomogeneous appearing liver worrisome for  diffuse hepatocellular disease.  Oncology consultation was requested for  further evaluation.   PAST MEDICAL HISTORY:  1. Questionable history of cervical cancer in 1980s, status post partial     hysterectomy and possible laser surgery.  2. CAD status post CABG September 24, 2002.  3. Hypertension.  4. Peripheral vascular disease.  5. Hyperlipidemia.  6. Right renal artery stenosis.  7. Postoperative anemia.  8. History of ETOH and tobacco abuse.   MEDICATIONS:  1. Albuterol MDI two puffs q.i.d.  2. Aspirin 325 mg daily.  3. Plavix 75 mg daily.  4. Lasix 40 mg daily.  5. Lopressor 12.5 mg  b.i.d.  6. Protonix 80 mg daily.  7. K-Dur 40 mEq daily.  8. Altace 5 mg daily.  9. Zocor 40 mg daily.   ALLERGIES:  No known drug allergies.   FAMILY HISTORY:  Father deceased unknown cause.  Mother with hypertension.  Sister and brother with diabetes mellitus and one sister who is healthy.   SOCIAL HISTORY:  The patient lives in [place].  She is  married.  She has two sons.  She is employed as a Training and development officer for [employer].  She has a positive tobacco history and quit smoking three months  ago.  Per her sister, she also has a long history of ETOH use which also  ended in January of 2004.  She has not had a blood transfusion.   REVIEW OF SYSTEMS:  The patient has had some weight loss.  Appetite has been  poor.  She  denies any fever or sweats.  She has  been experiencing some  dysphagia since her CABG.  She denies any problems with her bowels.  She has  had no urinary problems.  She denies any abdominal pain.   PHYSICAL EXAMINATION:  GENERAL APPEARANCE:  African American female in no  acute distress.  VITAL SIGNS:  Temperature 98.7, heart rate 70, respiratory rate 20, blood  pressure 137/70, oxygen saturation 99% on room air.  HEENT:  Normocephalic and atraumatic.  Pupils are equal, round and reactive  to light. Sclerae are anicteric.  Oropharynx is clear.  LYMPHS:  Shotty left posterior cervical adenopathy; possible bilateral  inguinal adenopathy, right greater than left.  CHEST:  Breath sounds are diminished at the bases bilaterally.  CARDIOVASCULAR:  Regular rate and rhythm.  ABDOMEN:  Soft and nontender.  EXTREMITIES:  No clubbing, cyanosis, or edema.  NEUROLOGIC:  Alert.  She responds slowly to questions but is appropriate.   LABORATORY DATA:  Hemoglobin 9.4, MCV 88, white count 5.9, platelets  484,000. Sodium 135, potassium 3.9, BUN 6, creatinine 0.7, glucose 121.  Total bilirubin 0.8, alkaline phosphatase 81, SGOT 115, SGPT 92, total  protein  6.5, albumin 3.0, calcium 8.2.  Ammonia 14.  PTT 36, PT 16.5/1.4.   Pre-CABG lab work revealed hemoglobin 13.4, white count 9.7, platelets  269,000.  Normal LFTs.   Peripheral blood smear (reviewed by Dr. Benay Spice):  1. Platelets - increased in number.  2. White blood cells - few atypical lymphs with cytoplasmic villae.  3. Red blood cells - moderate number of ovalocytes; moderate degree     variation in size; few teardrops; no schistocytes.   IMPRESSION:  1. Mental status change, dysarthria - improved; possibly related to     clonidine.  2. Recent coronary artery bypass graft surgery.  3. Anemia secondary to #2.  4. Bilateral pleural effusions secondary to #2.  5. History of alcohol use.  6. History of tobacco use.  7. Remote history of gynecologic malignancy, possibly cervical cancer.  8. Elevated PT - question if related to malnutrition, polypharmacy, liver     disease.  9. Left pelvic mass/adenopathy.  10.      Questionable right inguinal and left posterior cervical adenopathy.  11.      Elevated liver enzymes (normal prior to coronary artery bypass     grafting), questionable cirrhosis on CT.   DISCUSSION:  The pelvic mass/adenopathy is concerning for malignancy.  Dr.  Benay Spice cannot relate her current presentation to the CT scan findings.  The differential diagnosis includes recurrent cervical cancer, non-Hodgkin's  lymphoma, Hodgkin's disease, another gynecological malignancy,  gastrointestinal tumor.   RECOMMENDATIONS:  1. CT guided biopsy of left iliac adenopathy - core biopsies for routine     histology and lymphoma panel.  2. Vitamin K, follow-up PT on October 16, 2002.  3. Neurological evaluation per Dr. Erling Cruz.  4. Consider further work-up for liver disease pending the lymph node biopsy     result.  5. CT of neck/chest pending biopsy result.   We will continue to follow with you.  The patient was seen and examined by Dr. Benay Spice; chart, labs, peripheral  blood  smear, x-rays were reviewed.     Ned Card, N.P.                         Izola Price. Benay Spice, M.D.    LT/MEDQ  D:  10/16/2002  T:  10/16/2002  Job:  [id number]

## 2010-11-17 NOTE — Op Note (Signed)
[name], [name]                           ACCOUNT NO.:  [id number]   MEDICAL RECORD NO.:  [id number]                   PATIENT TYPE:  OIB   LOCATION:  2859                                 FACILITY:  [facility]   PHYSICIAN:  Richard A. Rollene Fare, M.D.          DATE OF BIRTH:  Nov 10, 1950   DATE OF PROCEDURE:  08/17/2003  DATE OF DISCHARGE:                                 OPERATIVE REPORT   PROCEDURE:  Retrograde abdominal aortic catheterization, abdominal aortic  angiogram, midstream P-A projection, bilateral selective renal angiography  by hand injection, bilateral iliac arteriography, bilateral lower extremity  runoff using digital subtraction angiography with step-table imaging,  cutting-balloon atherectomy and subsequent sandwich stent, high-grade in-  stent restenosis, right renal artery stent (inferior division), right  popliteal percutaneous transluminal angioplasty.   Ms. Zasada is a 60 year old African-American married mother of two with one  grandchild.  She was a heavy smoker up until her multivessel CABG by Dr.  Tharon Aquas Trigt, September 24, 2002, for three-vessel disease with preserved LV  function.  She was found at that time to have a 95% accessory inferior right  renal artery stenosis.  She underwent staged right renal artery PTA and  stenting June 13, 2003, with a 4-mm peripheral stent with good  angiographic result.  She is treated medically for hyperlipidemia,  hypertension.  She has right knee discomfort and has had prior back surgery,  and has had a nonmalignant pelvic mass, felt to be a cyst, evaluated by Dr.  Benay Spice.  She has a prior history of bradycardia and adverse allergic  dysarthric reaction to CATAPRES March 2004.  She is being evaluated now for  Doppler evidence of right renal artery restenosis associated with  exacerbation of her systemic hypertension on medical therapy and right lower  extremity claudication with RABI of 0.58, LABI of 0.94.   The  patient was escorted to the sixth floor PV Lab in a post-absorptive  state after 5-mg Valium p.o. premedication.  She was given intermittent  Nubain of 6 mg and Versed 2 mg total IV during the procedure.  The LCFA was  entered with a single anterior puncture using an 18 thin-walled needle and  additionally a short 5-French sheath was inserted.  Abdominal angiography  was done in the midstream P-A projection through a 5-French pigtail catheter  with DSA imaging above the level of the renal arteries at 20 cc, 20 cc per  second.  A second injection was done above the iliac bifurcation at the same  setting.  A 5-French short right coronary catheter was used for bilateral  selective renal angiography hand injection.  The pigtail catheter was  reinserted and bilateral lower extremity angiography was done at 88 cc, 8 cc  per second with DSA and step-table imaging to the feet bilaterally.   The patient tolerated the diagnostic procedure well.  Arterial pressures  were monitored throughout the procedure and ranged 140  to 160 mmHg.  The  patient remained in sinus rhythm.   Abdominal aortic angiogram demonstrated a patent left renal artery, but  there was some question of proximal left renal artery stenosis.  On  selective injection, the left renal artery was essentially normal with less  than 30% narrowing which was smooth and was single.   The right superior renal artery was widely patent and smooth.   The right inferior renal artery previously stented, supplying the posterior  division of the right kidney had in-stent restenosis of 80% diffusely from  the ostium to the distal portion of the stent.  The celiac and SMA axes were  intact.  The infrarenal abdominal aorta had no significant stenosis and the  IMA was present and intact.   The common iliacs were short and normal bilaterally.  The hypogastrics were  intact bilaterally.   The external iliac arteries were widely patent and smooth  bilaterally.   The SFA-profunda junctions were normal bilaterally.   The left SFA had 50% segmental narrowing in the proximal third and another  50% near Hunter's canal but good flow and no high-grade stenosis.  There was  evidence of diffuse atherosclerotic disease, noncritical.  The left  popliteal had no significant stenosis.   There was three-vessel runoff to the left foot which appeared fairly normal  with irregularities of the tibial trifurcation, but no high-grade stenosis  seen.   The right SFA-profunda was intact.  The proximal and mid right SFA had  irregularities without high-grade stenosis.  There was high-grade stenosis  of 95% with tandem, isolated lesions within this throughout the right  popliteal artery.   The right anterior tibial artery was totally occluded.  There were  geniculate collaterals that filled the proximal RAP, which then filled to  the right foot.  There was two-vessel runoff that was slow in the right foot  via the RPT and peroneal.   The patient was given 5000 units of heparin.  The sheath was upgraded to a 6-  [place] short side-arm sheath and the right inferior renal artery was  cannulated subselectively with a 6-French short JR-4 guiding catheter.  The  right renal artery was crossed with a stabilizer wire.  Cutting-balloon  atherectomy was performed with a 4.0 x 10 coronary Sci-Med cutting balloon  from the ostia to the distal portion of the stent at 10-40, 14-40, 4-40 and  14-40.  Following cutting-balloon atherectomy, there was area of residual  stenosis at the distal portion of the stent.  This was lightly dilated at 4  atmospheres and intra-arterial nitroglycerin was given, and this resolved  this stenosis.  There was, however, residual high-grade 95% stenosis at the  ostium of the right renal artery with elastic recoil with suboptimal cutting-  balloon atherectomy result in this area, so it was elected to proceed with underlapping sandwich  stenting.  Using exchange technique, a 4-mm x 12-mm  Cordis Genesis stent premapped on an Aviator balloon was positioned within  the prior stent to cover the ostium and protruded out approximately 2 mm  into the aorta.  It was deployed at 12-40, post-dilated at 5-40.  Final  result post-stenting was excellent with 0% stenosis at the ostia, less than  20% in the distal portion of the stent and just beyond it with good flow.   We then approached the right lower extremity in a contralateral fashion.  This was done with an IMA catheter, a Wooley wire and then a Terumo 6-French  cross-over sheath.  Injections were done through the cross-over sheath  selectively in the proximal, mid and distal right SFA, demonstrating no  significant disease up to the popliteal.   The popliteal artery had high-grade, 95% stenosis and segmental disease  throughout.  This ended just beyond the joint line but was in the flexion  area.   The lesion was crossed with the 0.035-inch Wooley wire under fluoroscopic  control.  It was then dilated with a 4-mm x 4-cm Cordis PowerFlex balloon at  5-40 and 6-40.  The balloon was then upgraded to a 5-mm x 6-cm Cordis  PowerFlex balloon and inflation was done at 6-40.  The high-grade tandem  stenoses in the mid popliteal did pop with the 4-mm balloon.   Final injections using DSA showed good angiographic result with less than  30% narrowing, no dissection and good flow throughout the popliteal.  There  was better antegrade flow; however, there were persistent collaterals to the  RAP from the supra-geniculate area.  The right TP and proximal peroneal and  RPT were intact with much more prompt, two-vessel runoff.   The sheath was pulled back.  The Wooley wire was removed.  The patient was  transferred to the holding area for sheath removal and pressure hemostasis.   She has had successful re-cutting-balloon atherectomy and restenting of her  high-grade right renal artery  stenosis for recurrent hypertension from prior  procedure of August 2004.   She has had successful right popliteal PTA alone for her subtotal, right  popliteal symptomatic stenosis with abnormal Dopplers and typical  claudication.   She will need to be followed with surveillance Dopplers and clinically for  possible restenosis in these areas.  Should she develop popliteal  restenosis, she might be a candidate for [place] atherectomy in this area.  We would certainly like to avoid stents because of the flexion of the  popliteal.   She also has arthritis of the right knee and is being evaluated  orthopedically.   CATHETERIZATION DIAGNOSES:  1. Atherosclerotic peripheral vascular disease, right lower extremity     claudication with abnormal Dopplers.  2. Successful right popliteal percutaneous transluminal angioplasty alone.  3. 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 4. Successful cutting-balloon atherectomy and subsequent sandwich stenting,     right renal artery.  5. Noncritical left superficial femoral artery disease with three-vessel     left lower extremity runoff.  6. Right anterior tibial occlusion with two-vessel right lower extremity     runoff.  7. Hyperlipidemia.  8. Neurovascular hypertension.  9. Chronic obstructive pulmonary disease, asthma.  10.      Arthritis of the knee.  11.      Nonmalignant pelvic cyst(s).  12.      Prior smoker, quit one year ago.  82.      Prior cerebrovascular accident with small-vessel disease, negative     carotid Dopplers.  14.      Prior elevated liver function tests on combination of Crestor and     Niaspan.  15.      Multivessel coronary artery bypass grafting, Ivin Poot, M.D.,     September 24, 2002.                                               Richard A.  Rollene Fare, M.D.    RAW/MEDQ  D:  08/17/2003  T:  08/17/2003  Job:  [id number]   cc:   Delfino Lovett A. Rollene Fare,  M.D.  [phone] [address], [place] [address]  Fax: [phone]   Sixth Floor PV LAB   Gwenlyn Perking, M.D.  [address]  [place]  [place] [address]  Fax: [place]. Benay Spice, M.D.  [place]. [place] [phone]  Fax: [phone]   Lawernce Keas, M.D.

## 2010-11-17 NOTE — Cardiovascular Report (Signed)
[name], [name]                           ACCOUNT NO.:  [id number]   MEDICAL RECORD NO.:  [id number]                   PATIENT TYPE:  INP   LOCATION:  3305                                 FACILITY:  [facility]   PHYSICIAN:  Richard A. Rollene Fare, M.D.          DATE OF BIRTH:  11-May-1951   DATE OF PROCEDURE:  09/23/2002  DATE OF DISCHARGE:                              CARDIAC CATHETERIZATION   PROCEDURE:  1. Retrograde central aortic catheterization, selective coronary angiography     post intracoronary administration.  2. Left ventricular angiogram, right anterior oblique/left anterior oblique     projection, subselective left internal mammary artery, right internal     mammary artery.  3. Abdominal aortic angiogram, midstream pulmonary artery projection.   DESCRIPTION OF PROCEDURE:  The patient was brought to the second floor CP  lab in a postabsorptive state after 5 mg Valium p.o. premedication.  The  right groin was prepped, draped in the usual manner.  Xylocaine, 1%, was  used for local anesthesia.  The RFA was entered with a single anterior  puncture using a #18 thin-walled needle, and a 6-French short Daig sidearm  sheath was inserted without difficulty.  The patient was on IV  nitroglycerin, IV Aggrastat on call to the laboratory.  IV heparin was held,  on call to the laboratory, ACT of 185 seconds.  Coronary angiography was  done with 6-French 4-cm taper, preformed coronary, and pigtail catheters.  IC nitroglycerin was given, 0.4 mg, with repeat injections obtained in  multiple projections.  Similarly, a right coronary angiography was done at  different magnifications and different projections because of the patient's  severe small coronary arteries.  LV angiogram was done in the RAO and LAO  projections at 25 cc/14 cc per second, 20 cc/12 cc per second.  Pullback  pressure of the CA was performed and showed no gradient across the aortic  valve.  Abdominal aortic angiogram  was done in the midstream PA projection  at 25 cc/20 cc per second.  Subselective left internal and right internal  mammary arteries were done with a right coronary catheter.  The catheter was  removed.  Sidearm sheath was flushed.  The patient tolerated the diagnostic  procedure well.  She was given 2 mg of Nubain and 2 mg of Versed for  sedation at the beginning of the procedure.  She was transferred to the  holding area for postoperative care in stable condition.   PRESSURES:  1. LV:  125/0, LVEDP 18 mmHg.  2. CA:  125/60.  3. There was no gradient across the aortic valve on catheter pullback.   ANGIOGRAPHIC DATA:  1. Fluoroscopy showed 2+ to 3+ calcification of all 3 coronary arteries.     There was no significant intracardiac or valvular calcification seen, but     there was mild annular calcification visualized.  2. LV angiogram in the RAO projection showed hypokinesis in the  mid to     anterolateral wall which was relatively mild.  In the LAO projection,     there was no significant wall motion abnormality.  EF approximately 55%     with no significant mitral regurgitation.  3. The LIMA was widely patent with no subclavian stenosis and good antegrade     left vertebral flow.  4. The RIMA was patent with no brachiocephalic stenosis and good antegrade     vertebral flow.  5. Abdominal aortic angiogram showed a single renal artery on the left with     approximately 30% narrowing.  On the right, the inferior accessory renal     artery had 95% stenosis in the ostium and proximal portion.  The superior     right renal artery had 40% narrowing.  6. The main left coronary was relatively short without any high-grade     stenosis, and there was associated calcification, and no catheter     damping.  7. The left anterior descending artery was diffusely diseased from its very     proximal portion before SP 1 and diagonal up to the mid portion with     approximately 80% segmental  narrowing and was a small vessel throughout.     It coursed to the apex of the heart where it bifurcated.  There was     another 80% to 90% narrowing of the LAD in the distal third before the     bifurcation, and there was probably 60% narrowing diffusely (estimate)     throughout the mid portion of the LAD.  There was good antegrade flow,     however.  8. The first diagonal was a thin branch with 95% segmental narrowing     throughout the proximal third, and it bifurcated.  The inferior branch     had a 90% narrowing.  9. The first marginal branch had tandem 80%, 90%, and 95% narrowing in a     large vessel, and the bifurcation had 90% and 95% bifurcation narrowing.     The distal bifurcation branches were of moderate size.  The mid     circumflex had approximately 75% narrowing segmentally and bifurcated     distally.  10.      The right coronary was a dominant vessel but diffusely diseased     throughout with probably 70% to 80% proximal stenosis, 80% at the acute     margin, and 80% and 90% beyond the acute margin; small thin PDA and PLA.   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  She was referred by Dr. Thea Silversmith for progressive  anginal symptoms.  There is a history of hyperlipidemia, hypertension, no  history of diabetes.  She has had several months of episodic chest pain with  exertion and lifting, and episodic nocturnal pain.  She has adjusted her  work schedule and gotten help at work to be able to go on with this.  She  finally came to the attention of Dr. Thea Silversmith who recommended initial  institution of medical therapy and aggressive evaluation, and she was sent  for a Cardiolite.  On Cardiolite testing, September 22, 2002, with Persantine  technique, she developed severe ST depression and essentially wiped out  her anterolateral wall.  Her pain resolved in the lab with medical therapy including sublingual nitroglycerin  and aminophylline to reverse the  Persantine.  She was admitted to the hospital, had borderline  elevation of  her troponin with no significant MB elevation, and EKG changes compatible  with LVH, and anterolateral  ST-T wave abnormalities without acute changes.   This patient has severe diffuse diabetic-type coronary arteriopathy and  advanced atherosclerotic disease.  She has diffuse disease of all 3 major  coronary arteries which are relatively small in the diseased portions  angiographically.  I believe she is a candidate, however, for multivessel  bypass surgery as the best form of therapy for her, and with aggressive  medical therapy which should include lipid-lowering and diabetic therapy, if  necessary.  Fortunately, she has well preserved LV function at this time,  and no other significant comorbid conditions at her young age of 49.   CATHETERIZATION DIAGNOSES:  1. Recent onset of progression of angina with early positive Cardiolite.  2. Systemic hypertension.  3. Hyperlipidemia.  4. Possible adult onset diabetes mellitus.  5. Severe 3-vessel diffuse coronary artery disease with well-preserved left     ventricular function.  6. Cigarette abuse, chronic bronchitis.                                               Richard A. Rollene Fare, M.D.    RAW/MEDQ  D:  09/23/2002  T:  09/23/2002  Job:  [id number]   cc:   Octavia Heir, M.D.  [phone] [address] [place] [address]  Fax: [phone]   Cardiac Catheterization Lab   Gwenlyn Perking, M.D.  [address]  [place]  [place] [address]  Fax: [phone]   Len Childs, M.D.  [address]  [place]  [place] [address]  Fax: [phone]

## 2010-11-17 NOTE — Cardiovascular Report (Signed)
NAMEMAYKALA, OCKER                           ACCOUNT NO.:  [id number]   MEDICAL RECORD NO.:  [id number]                   PATIENT TYPE:  OIB   LOCATION:  6522                                 FACILITY:  [facility]   PHYSICIAN:  Richard A. Rollene Fare, M.D.          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   Diagnostic catheterization was done through a 6 [place] Daig side arm sheath  that was placed in CRFA via modified Seldinger technique with a 18 thin-wall  needle single anterior puncture.  Catheters were 6 French 4 cm taper  performed coronary Cordis and pigtail catheters using Omnipaque dye.  The  patient was premedicated with 5 mg of Valium p.o. preoperatively, hydrated  preoperatively.  Glucophage was on hold.  Saphenous vein graft angiography  was done with right coronary catheter.  Selective LIMA angiography was done  with 6 [place] IMA catheter.  Selective right renal angiography was done with  a right coronary catheter using hand injections.  Pullback pressure of the  CA showed no gradient across the aortic valve and LV angiogram was done in  the RAO and LAO projections at 25 mL, 14 mL per second; 20 mL, 12 mL per  second respectively.  The patient tolerated the diagnostic procedure well.   PRESSURES:  1. LV:  180/0; LVEDP 16-18 mmHg.  2. CA:  180/90 mmHg.  3. There is no gradient across the aortic valve on catheter pullback.   Fluoroscopy showed 2+ calcification  diffusely of the right and left coronary  system.  No significant intracardiac or valvular calcification.  The right  renal stent was viewed fluoroscopically.   LV angiogram demonstrated hypokinesis of the mid anterior wall that was  mild.  Hypokinesis of the basilar and mid inferior and posterior apical  wall.  Estimated EF was approximately 50% with no significant MR.   Abdominal angiogram demonstrated single normal left renal artery.  Double  right renal artery.  The right superior renal artery was widely patent.  The  right inferior renal artery appeared stenosed at the origin, but this was  hidden because of projection.  The infrarenal abdominal aorta had very mild  atherosclerotic disease and was relatively small.  The common and external  iliacs bilaterally were patent with no significant stenosis and hypogastrics  were intact.  The CFAs bilaterally were intact and the right profunda SFA  junction was intact.  There appeared to be fairly good runoff down to this  level.   Selective right  renal angiogram revealed 90% in-stent ostial restenosis and  about 70% proximal restenosis of the previously placed right renal stent.   The main left coronary was small and short without significant stenosis.   The LAD was totally occluded in its proximal third after a large, but very  thin diagonal branch that had multiple collaterals forming.  This diagonal  was not jeopardized, but was very small.  The first septal perforator was  patent and large and also supplied collaterals to the circumflex and distal  right distribution.   The native circumflex artery showed 50% smooth narrowing just before the OM-  1 branch.  The OM-1 branch had competitive filling and was the grafted  marginal branch.  The circumflex proper had irregularities, but two  moderately large distal branches with no significant stenosis.  There were  collaterals from the distal native circumflex to the distal RCA.   The  native right coronary had diffuse disease from beginning to end,  probably greater than 70-80% with focal 95 and 99% in the mid portion.  Very  small PDA and PLA branches.   The saphenous vein graft to the RCA was widely patent with excellent  anastomosis to a very small distal RCA.  There was 80% and diffuse narrowing  beyond the insertion, but there was good filling through the graft to these  very small grafted vessel and tributaries.   Saphenous vein graft to the OM-1 branch was widely patent with an excellent  anastomosis.  The superior bifurcation was widely patent.  The inferior  bifurcation had a 90% stenosis in a very small less than 1 mm vessel.   The LIMA was widely patent through the mid LAD.  There was good flow, but  the LAD beyond the insertion was diffusely diseased with focal 80-85%  narrowing, but diffuse 70-80% narrowing throughout its course, but good  filling to the apex despite the small vessel.   DISCUSSION:  Mrs. 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  She has hypertension, hyperlipidemia, noninsulin  dependent diabetes.  She was previously a heavy smoker as well.  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  She underwent coronary  artery bypass graft x3 by Dr. Prescott Gum September 25, 2002.  Did have  reexploration postoperatively, but did well.  It was noted that she had  extremely small and diffusely diseased distal vessels at surgery.  She  recently had exacerbation and recurrence of angina.  Was treated as an  outpatient and underwent Cardiolite testing which was positive for anterolateral ischemia (December 13, 2003) with  associated ventricular ectopy.  She has also had elevated velocities in her right renal artery suggesting in-  stent restenosis.  She had previously had right renal artery PTA and  stenting on August 17, 2003 with 4 x 12 Genesis stent associated with  right popliteal PTA with 5 mm balloon for high grade 95% stenosis and  Fontaine 2b claudication.  She had occlusion of her right anterior tibial  with collaterals, but two-vessel runoff on the right and only 50% narrowing  seg mentally of her left SFA with three-vessel LLE runoff.  She has not had  recurrent claudication.  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   She has previously had elevated LFTs on niacin and Crestor.  She is off  niacin and LFTs have been normal.  She has stopped smoking as well.   The patient has patent grafts x3, but severe distal disease.  The distal  vessels are very small in RCA and OM-1 and the LAD and are not suitable for  intervention.  She has started to develop collaterals from her diagonals and  septal perforator branch and her native circumflex.  I believe Mrs. Knable  might benefit from EECP along with further maximization of her medical  therapy.   She has significant systemic hypertension on multiple medications which is  probably renal vascular in part and may be related to the restenosis of her  right inferior renal artery.  For that reason, we plan to proceed with renal  artery intervention.  Informed consent was obtained in the laboratory to  proceed with this.   The right renal artery was intubated with 6 [place] IMA catheter after the  patient had 3500 units of heparin IV and ACT was therapeutic.  The lesion  was crossed with 0.014 inch Asahi soft guide wire.  The in-stent restenosis  was dilated with a 4.0/10 coronary cutting balloon with inflations up to 10-  12 atmospheres.  The balloon was  pulled back and angiography showed  excellent result with 85-90% ostial and proximal in-stent restenosis reduced  to less than 20% with excellent flow.  Dilatation system was removed, side  arm sheath was flushed and the patient was transferred to the holding area  for sheath removal and pressure hemostasis.   CATHETERIZATION DIAGNOSES:  1. Arteriosclerotic heart disease, severe diffuse diabetic type three-     vessel disease treated with coronary artery bypass graft x3 September 25, 2002 by Dr. Tharon Aquas Trigt.  2. Recurrent angina and positive Cardiolite.  3. Widely patent left internal mammary artery, left anterior descending;     patent saphenous vein graft to OM-1; patent saphenous vein graft to     distal right coronary artery; severe distal disease beyond graft     insertions in all three vessels as outlined above  4. Well-preserved left ventricular function, segmental wall motion     abnormality, ejection fraction 50%. 5. Adult onset diabetes mellitus.  6. Hyperlipidemia.  7. Past heavy smoker.  8. Renal vascular hypertension status post right renal artery percutaneous     transluminal angioplasty and stent August 2004, redilatation for ISR     March 2005; second redilatation for ISR and elevated blood pressure December 23, 2003.  9. Arteriosclerotic peripheral vascular disease, Fontaine 2b claudication     and high grade right popliteal stenosis treated with percutaneous     transluminal angioplasty successfully March 2005.                                               Richard A. Rollene Fare, M.D.    RAW/MEDQ  D:  12/23/2003  T:  12/24/2003  Job:  [id number]   cc:   [facility] Fey, M.D.  [address], [address] [place], [place] [address]  Fax: [place]  Floyde Parkins, M.D.  [address]  [place]  [place] [address]

## 2010-11-17 NOTE — Discharge Summary (Signed)
[name], [name]                           ACCOUNT NO.:  [id number]   MEDICAL RECORD NO.:  [id number]                   PATIENT TYPE:  OIB   LOCATION:  6523                                 FACILITY:  [facility]   PHYSICIAN:  Richard A. [name], M.D.          DATE OF BIRTH:  1951/06/19   DATE OF ADMISSION:  02/11/2003  DATE OF DISCHARGE:  02/12/2003                                 DISCHARGE SUMMARY   DISCHARGE DIAGNOSES:  1. 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     a. Peripheral arteriogram with PTA and stent to the right renal artery.  2. Abnormal hepatic panel most likely related to Crestor and Niaspan. Those     have been discontinued.  3. Hyperlipidemia.  4. Pelvic cyst aspirated by Dr. Julieanne [name].  5. Coronary artery disease status post bypass grafting March 2004, out on     disability.   CONDITION ON DISCHARGE:  Improved.   PROCEDURES:  On February 11, 2003 peripheral arteriogram with PTA and stent to  the right renal artery for stenosis.   DISCHARGE MEDICATIONS:  1. No Niaspan, no Crestor.  2. Enteric-coated aspirin 81 mg daily.  3. Plavix 75 mg daily.  4. Protonix 40 mg daily.  5. Altace 20 mg daily.  6. Lopressor 25 mg three times a day.  7. Lasix 20 mg daily.  8. K-Dur 20 mEq daily.  9. Diclofenac 75 mg one twice a day.  10.      Have blood work done next Wednesday.   DISCHARGE INSTRUCTIONS:  1. No strenuous activity. No sexual activity. No lifting over 10 pounds for     three days then resume regular activity, but stay out on disability.  2. Low-fat, low-salt diet.  3. Wash cath site with soap and water. Call if any bleeding, swelling, or     drainage.  4. Have renal artery Dopplers done on second floor, Dr. Lowella [name] office     February 23, 2003 at 9 a.m.  5. Follow-up with Dr. Rollene [name] March 10, 2003 at 10:15.  6. Follow-up with Dr. Benay [name] as previously instructed.   HISTORY OF PRESENT ILLNESS:  A 60 year old  African-American female followed  by Dr. Rollene [name] with coronary artery disease and bypass grafting September 24, 2002. She had an incidental finding of 95% right renal artery stenosis at  that time. In April 2004 she had some mental status change, seen by  Dr. Katherina Right and it was felt that it was a reaction to Clonidine. At that time her  LFTs were slightly elevated. She did have a pelvic mass that turned out to  be cystic, but there were questions at the liver also and she was to have a  follow-up CT in the near future.   She presented February 11, 2003 as elected outpatient for peripheral  arteriogram and stent to the right renal artery.   On arrival to the  short stay unit she did have lab work down, which revealed  elevated LFTs and we discontinued her Crestor and Niaspan.   PAST MEDICAL HISTORY:  As stated.   CURRENT MEDICATIONS:  1. Metoprolol.  2. Crestor.  3. Altace.  4. Potassium.  5. Lasix.  6. Aspirin.  7. Niaspan as stated, now discontinued.   ALLERGIES:  No known drug allergies.   For social history, family history, review of systems, see H&P.   PHYSICAL EXAMINATION AT DISCHARGE:  GENERAL:  Alert and oriented female in  no acute distress.  VITAL SIGNS:  Blood pressure 130/60, pulse 52, respirations 22, oxygen  saturation on room air 99%.  CHEST:  Clear without rales, rhonchi, wheezes.  HEART:  S1, S2, regular rate and rhythm.  EXTREMITIES:  Right groin cath site is stable.   LABORATORY DATA:  Hemoglobin 12.9, hematocrit 39, platelets 256, WBC 9.4. At  that time she had a urinary tract infection, which was treated. On arrival  to the hospital sodium 140, potassium 4.0, chloride 106, CO2 30, glucose 98,  BUN 11, creatinine 0.9. Alkaline phos 118, SGOT 118, and SGPT 155. The SGOT  and SGPT were totally normal in April 2004. Her UA had cleared with only a  small amount of leukocytes. INR was 1.4, PTT 28. Hemoglobin 13, hematocrit  37.8. EKG revealed sinus bradycardia  rate of 48. RSR prime in v1.  Nonspecific T-wave abnormality. Peripheral vascular calves as stated. She  had 95% inferior renal artery stenosis on the right. She underwent PTA and  stent to that. She did have hypertension during the procedure and received  IV labetalol.   HOSPITAL COURSE:  The patient did well post procedures. Stayed in  observation overnight and by the morning of February 12, 2003 she was ready  for discharge. She was notified to stop the Crestor and the Niaspan. We will  recheck those in one week to see if they are coming back down. She will  follow up with Dr. Rollene [name] and Dr. Benay [name] as previously instructed. Dr.  Rollene [name] saw her and discharged her on the 13th.      Otilio Carpen. [place], N.P.                     Richard A. [name], M.D.    LRI/MEDQ  D:  02/12/2003  T:  02/13/2003  Job:  [id number]   cc:   Gwenlyn Perking, M.D.  [address]  [place]  [place] [address]  Fax: [phone]   Izola Price. [name], M.D.  [place]. [place]  [address]  Fax: [phone]   Cletis Athens, M.D.  [address]  [place]  [place] [address]  Fax: [phone]

## 2010-11-17 NOTE — Consult Note (Signed)
NAMEMERCED, TRENCHARD                           ACCOUNT NO.:  [id number]   MEDICAL RECORD NO.:  [id number]                   PATIENT TYPE:  INP   LOCATION:  3305                                 FACILITY:  [facility]   PHYSICIAN:  Ivin Poot III, M.D.           DATE OF BIRTH:  January 03, 1951   DATE OF CONSULTATION:  09/23/2002  DATE OF DISCHARGE:                                   CONSULTATION   REASON FOR CONSULTATION:  Unstable angina with severe three vessel coronary  artery disease.   PRIMARY CARE PHYSICIAN:  L[facility] on [employer].   CHIEF COMPLAINT:  Chest pain.   HISTORY OF PRESENT ILLNESS:  I was asked to evaluate this 60 year old black  female for potential surgical coronary revascularization for recent  diagnosis of severe three vessel coronary artery disease.  She presented to  Dr. Rollene Fare with symptoms of unstable angina and resting nocturnal angina.  A stress test was strongly positive and cardiac catheterization today showed  severe three vessel disease with high grade 90 to 95% stenosis of all three  main coronary vessels.  Her LV systolic function was well preserved with EF  of 55%.  Although she has diffuse disease and small coronary vessels that  are poor targets, she was felt to be a candidate for surgical coronary  artery revascularization.   PAST MEDICAL HISTORY:  1. Hypertension.  2. Hyperlipidemia.  3. History of smoking, quit January 2004.  4. Claudication with peripheral vascular disease of the right leg.  5. Right renal artery stenosis.   ALLERGIES:  No known drug allergies.   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  She works in [employer].  She stopped smoking two months  ago.  She is married and has several children and one grandchild.   FAMILY HISTORY:  No family history of premature coronary artery disease,  death or myocardial infarction.   REVIEW OF SYSTEMS:  No constitutional  symptoms, weight loss or fever.  ENT  review positive for dental plates.  Negative for recent change in vision,  headache, difficulty swallowing.  PULMONARY:  Negative for recent pneumonia,  productive cough or chest trauma.  CARDIOVASCULAR:  Positive for symptoms of  angina, negative for prior cardiac catheterization, prior myocardial  infarction, cardiac murmur or rheumatic heart disease.  GI:  Positive for  reflux esophagitis, better with Nexium.  GU:  Negative for hematuria or  kidney stone.  ENDOCRINE:  Negative for diabetes or thyroid disease.  ___________ review is negative for DVT, positive for right leg claudication,  negative for TIA.  NEUROLOGIC:  Negative for seizure, stroke or syncope.  PSYCHIATRIC:  Negative for depression, diminished appetite.  HEMATOLOGIC:  Negative for blood transfusion or bleeding disorder.   PAST SURGICAL HISTORY:  This is positive for a partial hysterectomy and she  apparently developed pelvic infection following that operation  which  resolved with drainage and antibiotics.   PHYSICAL EXAMINATION:  GENERAL APPEARANCE:  She is in the ICU step-down unit  following cardiac catheterization and is in no distress.  Her family is in  attendance during the consultation.  The patient is a middle-age black  female in no distress.  She is anxious.  VITAL SIGNS:  She is 5 feet 6 inches, weighs 130 pounds, blood pressure  120/60, pulse 80 and regular, respiratory rate 18.  HEENT:  Normocephalic, full EOM.  Dentition with missing front upper teeth  but no severe dental disease.  Pharynx is clear.  NECK:  No JVD, thyromegaly or mass.  Good range of motion of the neck.  LYMPHATICS:  No palpable, supraclavicular or axillary adenopathy.  LUNGS:  Clear.  CARDIOVASCULAR:  Regular rhythm without S3, gallop or murmur.  ABDOMEN:  Soft, nontender without pulsatile mass.  RECTAL:  Deferred.  EXTREMITIES:  No clubbing, cyanosis, or edema.  MUSCULOSKELETAL:  No spinal  deformity.  No atrophy.  VASCULAR:  There are 2+ pulses on the left pedal, nonpalpable pulses on the  right pedal.  Both radial arteries are palpable.  No veinous insufficiency  in the lower extremities.  NEUROLOGIC:  Alert and oriented x3, full motor function without focal  weakness.  SKIN:  Warm, clear and dry without rash or lesion.   LABORATORY DATA:  Cardiac catheterization shows she has severe diffuse  disease of her coronaries.  They are poor targets for grafting.   Her chest x-ray report is clear.   Her BUN and creatinine are normal.  Hematocrit is 38%.  INR is normal.   IMPRESSION AND RECOMMENDATIONS:  The patient has severe symptomatic angina  which occurs almost every night.  Fortunately, she has not had an myocardial  infarction  yet.  She is not a candidate for percutaneous therapy due to the  diffuse nature of her coronary disease.  Surgical revascularization will be  difficult due to the poor targets from her diffuse disease but her only  realistic option for any long-term benefit for survival.  I have discussed  the procedure in detail with the patient and her family including  alternatives to surgery and expressive outcome of those alternative  therapies.  She understands that the risk for this operation will be higher  than the normal risk due to diffuse nature of her coronary disease and she  is at increased risk for perioperative myocardial infarction, bleeding or  death.  She understands the implications for the surgery and agrees to  proceed with the operation which will be scheduled for the afternoon of  September 24, 2002.   Thank you for this consultation.                                               Len Childs, M.D.    PV/MEDQ  D:  09/23/2002  T:  09/24/2002  Job:  [id number]

## 2010-11-17 NOTE — Op Note (Signed)
NAMENALLELY, FADLEY NO.:  [id number]   MEDICAL RECORD NO.:  [id number]                   PATIENT TYPE:  INP   LOCATION:  2307                                 FACILITY:  [facility]   PHYSICIAN:  Ivin Poot III, M.D.           DATE OF BIRTH:  [birth year]   DATE OF PROCEDURE:  09/24/2002  DATE OF DISCHARGE:                                 OPERATIVE REPORT   PREOPERATIVE DIAGNOSES:  Class I unstable angina with severe three-vessel  coronary artery disease, diffuse small-vessel disease with poor targets.   POSTOPERATIVE DIAGNOSES:  Class I unstable angina with severe three-vessel  coronary artery disease, diffuse small-vessel disease with poor targets.   PROCEDURES:  1. Coronary artery bypass grafting x3 (left internal mammary artery to left     anterior descending coronary artery, saphenous vein graft to circumflex     marginal, saphenous vein graft to right coronary artery).  2. Placement of intra-aortic balloon pump.   SURGEON:  Ivin Poot, M.D.   ASSISTANT:  Mardene Celeste, R.N.   ANESTHESIA:  General by Leda Quail, M.D.   INDICATIONS:  The patient is a 60 year old black female who presented with  unstable angina and ruled out for MI.  Cardiac catheterization by Dr.  Rollene Fare demonstrated severe three-vessel disease and preserved LV  function.  She was felt to be a candidate for surgical coronary  revascularization, although at high risk due to her severe diffuse small-  vessel disease, which were poor targets for grafting.  However, the patient  had no other therapeutic options and was having nocturnal angina regularly.  I examined the patient in her hospital room following cardiac  catheterization and reviewed the results of the cardiac catheterization with  the patient and family.  I discussed with the patient the indications and  expected benefits of coronary bypass surgery for treatment of her coronary  artery disease.  I  discussed the alternatives to surgical therapy for  treatment of her coronary artery disease and the expected outcome of those  alternative therapies.  I discussed with the patient and her family the  major aspects of the proposed operation, including the choice of conduit for  grafting, the use of general anesthesia and cardiopulmonary bypass, the  location of the surgical incisions, and the expected postoperative hospital  recovery period.  I reviewed with the patient the risk to her of coronary  bypass surgery, including the risks of MI, CVA, bleeding, infection, blood  transfusion requirement, and death.  She understood that she would be at  high risk for this procedure due to her severe three-vessel diffuse disease  and suboptimal target vessels for grafting.  She agreed to proceed with the  operation as planned after our discussion under what I felt was an informed  consent.   OPERATIVE FINDINGS:  The patient's saphenous vein at the left thigh appeared  to be sclerotic and  not usable.  The saphenous vein was harvested from the  right thigh using endoscopic technique.  The left internal mammary artery  was small but had adequate flow.  The coronaries were severely and diffusely  diseased and were suboptimal targets for grafting.  Only 1 mm probes could  be passed through her vessels.  At the termination of the procedure while  the chest was closed, the patient developed significant hypotension which  did not respond to intervention, and the chest was reopened and she was  placed on bypass and a balloon pump was placed to wean off the patient off  bypass successfully and successfully close the chest.   DESCRIPTION OF PROCEDURE:  The patient was brought to the operating room and  placed supine on the operating table, where general anesthesia was induced  under invasive hemodynamic monitoring.  She remained stable.  The chest,  abdomen, and legs were prepped with Betadine and draped as  a sterile field.  A sternal incision was made as the saphenous vein was harvested from the  right leg.  The left internal mammary artery was harvested as a pedicle  graft from its origin at the subclavian vessel.  It was good vessel,  although small, but with adequate flow.  The sternal retractor was placed.  The pursestrings were placed in the ascending aorta and right atrium.  The  patient was cannulated and placed on bypass, cooled to 32 degrees.  Cardioplegia catheters were placed for both antegrade and retrograde  delivery of cold blood cardioplegia.  The coronary vessels were identified  for grafting and the mammary artery and vein grafts were prepared for the  distal anastomoses.  The patient was cooled to 28 degrees and as the aortic  crossclamp was applied, 600 mL of cold blood cardioplegia was delivered in  split doses between the antegrade aortic and retrograde coronary sinus  cardioplegia catheters.  There was a good cardioplegic arrest with septal  temperature dropping to less than 12 degrees.  Topical iced saline slush was  used to augment myocardial preservation, and a pericardial insulator pad was  used to protect the left phrenic nerve.   The distal coronary anastomoses were then performed.  The first distal  anastomosis was of the distal right.  This was a small, thickened vessel  with a sclerotic wall, and a 1 mm probe passed distally.  A reversed  saphenous vein was sewn end-to-side with running 7-0 Prolene with adequate  flow through the graft.  The second distal anastomosis was to the circumflex  marginal.  This was a 1.0 mm vessel by probe placement in the lumen.  It had  severe diffuse distal disease.  A reversed saphenous vein was sewn end-to-  side with running 7-0 Prolene with adequate flow through the graft.  Cardioplegia was redosed.  The third distal anastomosis was to the distal third of the LAD.  It was deeply intramyocardial proximally.  A 1 mm probe   passed through the lumen of the LAD.  The left internal mammary artery  pedicle was brought through an opening created in the left lateral  pericardium and was brought down onto the LAD and sewn end-to-side with a  running 8-0 Prolene.  There was adequate flow through the anastomosis after  removing the pedicle clamp on the mammary artery.  The mammary pedicle was  secured to the epicardium and the aortic crossclamp was removed.   The heart was cardioverted back a regular rhythm.  Using a  proximal  occluding clamp, two proximal vein anastomoses were placed on the ascending  aorta using a 4.0 mm punch and running 6-0 Prolene.  The partial clamp was  removed and the vein grafts were perfused.  Each had adequate flow, and  hemostasis was documented in the proximal and distal anastomoses.  The  patient was rewarmed and reperfused.  Temporary pacing wires were applied.  When the patient reached 37 degrees, she was weaned from cardiopulmonary  bypass without difficulty on low-dose dopamine.  Cardiac output and blood  pressure were stable.  Protamine was administered.  There was no adverse  reaction to the protamine.  The cannulas were removed.  The mediastinum was  irrigated with warm antibiotic irrigation.  The leg incision was irrigated  and closed in a standard fashion.  The pericardium was loosely  reapproximated.  Two mediastinal and a left pleural chest tube were placed  and brought out through separate incisions.  Sternal wires were placed.  When the sternal wires were tightened and the chest was closed, the patient  had an episode of blood pressure which did not reverse with the usual  therapeutic interventions.  The sternal wires were removed.  The sternal  retractor was replaced and internal cardiac massage was initiated.  The  patient was given a new dose of heparin and pursestrings were placed and the  patient was placed on bypass expeditiously.  The heart was emptied out and   rested.  The heart never stopped, and the patient never fibrillated.  There  were no apparent EKG ischemic changes which preceded this episode of  hypotension.  A femoral artery catheter was then placed in the left groin  and an intra-aortic balloon pump was placed percutaneously and secured to  the skin.  After resting the patient for over 40 minutes on bypass and  balloon pump counterpulsation, she was then weaned again after the  ventilator was optimized and her hemodynamic parameters remained stable  after weaning from bypass.  Protamine was again administered without adverse  reaction and the cannulas were removed.  The patient was observed for  several minutes before reclosing the chest and replacing the mediastinal  chest tubes.  The patient remained stable  after chest closure with cardiac output of 4 L on low to medium dose of  dopamine.  Total cardiopulmonary bypass time was 120 minutes for the initial bypass period and an additional 45 minutes after her period of instability.  Total crossclamp time was 45 minutes.                                               Len Childs, M.D.    PV/MEDQ  D:  09/24/2002  T:  09/26/2002  Job:  [id number]   cc:   Delfino Lovett A. Rollene Fare, M.D.  [phone] [address], [place] [address]  Fax: [phone]

## 2010-11-17 NOTE — Discharge Summary (Signed)
NAMEBENAY, SZUCH                           ACCOUNT NO.:  [id number]   MEDICAL RECORD NO.:  [id number]                   PATIENT TYPE:  OIB   LOCATION:  [id number]                                 FACILITY:  [facility]   PHYSICIAN:  Cyndia Bent, N.P.                  DATE OF BIRTH:  1950/09/30   DATE OF ADMISSION:  12/23/2003  DATE OF DISCHARGE:  12/24/2003                                 DISCHARGE SUMMARY   Ms. [name] came into the hospital because of new onset of chest pain.  She went on to have a dobutamine-Cardiolite.  It was positive showing  ischemia in the anterior and lateral areas.  Thus, it was decided she should  undergo cardiac catheterization.  She does have a history of coronary bypass  grafting x3.  She did have small vessel disease with poor targets at that  time.  However, she had had a postop Cardiolite and there was a lot of  change.  Thus, it was decided she needed to be admitted to undergo cardiac  cath.  This was performed on December 23, 2003, by Dr. Terance Ice.  All  her 3 grafts were patent, her LIMA to her LAD, her SVG to her circumflex,  and her SVG to her RCA; however, she had distal disease passed all 3 grafts,  85% in the LAD, 90% in the circumflex, and 80% in the RCA.  She did have  some collateral flow to her distal RCA.  She was also found to have in-stent  restenosis of her right renal stent.  Thus, she underwent cutting balloon  PCI for in-stent restenosis, reduced from 90 down to less than 20%.  Dr.  Rollene Fare recommended that she undergo  EECP as an outpatient.  On the  morning of December 24, 2003, she was stable, she had been up in her room and up  in the hall walking, the right groin was without any bruising or hematoma,  blood pressure was 104/54, heart rate of 48, respirations 18, and  temperature 98.8.   LABORATORY DATA:  Hemoglobin 10.8, hematocrit 31.1, platelets 330, WBC 7.  Her sodium was 138, potassium 3.7, BUN 8, creatinine 0.8, and her  glucose  was 109.  Her iron was 29, TIBC 265, percent sat was 11.   DISCHARGE MEDICATIONS:  1. Altace 20 mg once per day.  2. Aspirin once per day.  3. Plavix 75 mg once per day.  4. Crestor 10 mg once per day.  5. Nexium 40 mg once per day.  6. Toprol-XL 100 mg once per day.  7. Norvasc 10 mg once per day.  8. Furosemide 40 mg once per day.  9. K-Dur 10 mEq once per day.  10.      Imdur 30 mg every day.  11.      Metformin 500 mg one a day.  She was started on December 26, 2003.  12.      Nitroglycerin if needed.  13.      Inhalers as taken at home.   ACTIVITIES:  As tolerated.   DIET:  She will be on a diabetic diet.   FOLLOWUP:  She is wanting to go on vacation on July 2nd and, thus, we will  see her to check her blood on June 29th at 9 a.m.  She will then follow up  for other cardiac evaluation on January 10, 2004, and she will be set up for  EECP treatments.   DISCHARGE DIAGNOSES:  1. New onset of angina.  Positive dobutamine-Cardiolite.  2. Status post catheterization on December 23, 2003, showing small vessel     disease with a high-grade stenosis passed all three of her grafts.  Her     left internal mammary artery to her left anterior descending was patent,     saphenous vein graft to her circumflex patent, and saphenous vein graft     to her right coronary was patent.  Enhanced external counterpulsation has     been recommended.  3. Ejection fraction of 50%.  4. 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  5. History of 95% popliteal with previous PTA, February 2005.  6. Hypertension.  7. Chronic obstructive pulmonary disease.  8. Hyperlipidemia.  9. History of bradycardia on Clonidine.  10.      History of pelvic mass evaluated by Dr. Benay Spice felt to be     nonmalignant cyst.  11.      Diabetes mellitus, type 2.                                                 Cyndia Bent, N.P.    BB/MEDQ  D:  12/24/2003  T:  12/25/2003  Job:  [id number]   cc:   [facility]

## 2010-12-31 HISTORY — PX: NM MYOCAR PERF WALL MOTION: HXRAD629

## 2011-01-23 ENCOUNTER — Other Ambulatory Visit: Payer: Self-pay | Admitting: Internal Medicine

## 2011-01-23 DIAGNOSIS — N6310 Unspecified lump in the right breast, unspecified quadrant: Secondary | ICD-10-CM

## 2011-01-31 ENCOUNTER — Other Ambulatory Visit: Payer: Self-pay | Admitting: Internal Medicine

## 2011-01-31 ENCOUNTER — Ambulatory Visit
Admission: RE | Admit: 2011-01-31 | Discharge: 2011-01-31 | Disposition: A | Discharge status: Home / Self Care | Payer: BC Managed Care – PPO | Source: Ambulatory Visit | Attending: Internal Medicine | Admitting: Internal Medicine

## 2011-01-31 DIAGNOSIS — N6310 Unspecified lump in the right breast, unspecified quadrant: Secondary | ICD-10-CM

## 2011-08-24 ENCOUNTER — Other Ambulatory Visit: Payer: Self-pay | Admitting: Cardiovascular Disease

## 2011-08-31 ENCOUNTER — Encounter (HOSPITAL_COMMUNITY): Payer: Self-pay | Admitting: Respiratory Therapy

## 2011-08-31 ENCOUNTER — Other Ambulatory Visit: Payer: Self-pay | Admitting: Cardiovascular Disease

## 2011-08-31 DIAGNOSIS — I771 Stricture of artery: Secondary | ICD-10-CM

## 2011-09-07 ENCOUNTER — Other Ambulatory Visit: Payer: Self-pay | Admitting: Cardiovascular Disease

## 2011-09-11 ENCOUNTER — Other Ambulatory Visit: Payer: Self-pay

## 2011-09-11 ENCOUNTER — Ambulatory Visit (HOSPITAL_COMMUNITY): Payer: Medicare Other

## 2011-09-11 ENCOUNTER — Encounter (HOSPITAL_COMMUNITY): Payer: Self-pay

## 2011-09-11 ENCOUNTER — Encounter (HOSPITAL_COMMUNITY)
Admission: RE | Disposition: A | Discharge status: Home / Self Care | Payer: Self-pay | Source: Ambulatory Visit | Attending: Cardiovascular Disease

## 2011-09-11 ENCOUNTER — Ambulatory Visit (HOSPITAL_COMMUNITY)
Admission: RE | Admit: 2011-09-11 | Discharge: 2011-09-11 | Disposition: A | Discharge status: Home / Self Care | Payer: Medicare Other | Source: Ambulatory Visit | Attending: Cardiovascular Disease | Admitting: Cardiovascular Disease

## 2011-09-11 ENCOUNTER — Other Ambulatory Visit: Payer: BC Managed Care – PPO

## 2011-09-11 DIAGNOSIS — I4891 Unspecified atrial fibrillation: Secondary | ICD-10-CM | POA: Insufficient documentation

## 2011-09-11 HISTORY — PX: CARDIOVERSION: SHX1299

## 2011-09-11 LAB — GLUCOSE, CAPILLARY: Glucose-Capillary: 97 mg/dL (ref 70–99)

## 2011-09-11 LAB — PROTIME-INR
INR: 2.11 — ABNORMAL HIGH (ref 0.00–1.49)
Prothrombin Time: 24 seconds — ABNORMAL HIGH (ref 11.6–15.2)

## 2011-09-11 SURGERY — CARDIOVERSION
Anesthesia: General | Wound class: Clean

## 2011-09-11 MED ORDER — HYDROCORTISONE 1 % EX CREA: 1.0000 "application " | TOPICAL_CREAM | Freq: Three times a day (TID) | CUTANEOUS | Status: DC | PRN

## 2011-09-11 MED ORDER — SODIUM CHLORIDE 0.9 % IV SOLN: INTRAVENOUS | Status: DC

## 2011-09-11 MED ORDER — SODIUM CHLORIDE 0.9 % IJ SOLN: 3.0000 mL | INTRAMUSCULAR | Status: DC | PRN

## 2011-09-11 MED ORDER — METOPROLOL TARTRATE 50 MG PO TABS: 25.0000 mg | ORAL_TABLET | Freq: Two times a day (BID) | ORAL | Status: DC

## 2011-09-11 MED ORDER — PROPOFOL 10 MG/ML IV BOLUS
INTRAVENOUS | Status: DC | PRN
  Administered 2011-09-11: 140 mg via INTRAVENOUS

## 2011-09-11 MED ORDER — LIDOCAINE HCL (CARDIAC) 20 MG/ML IV SOLN
INTRAVENOUS | Status: DC | PRN
  Administered 2011-09-11: 25 mg via INTRAVENOUS

## 2011-09-11 MED ORDER — SODIUM CHLORIDE 0.9 % IV SOLN: 250.0000 mL | INTRAVENOUS | Status: DC

## 2011-09-11 NOTE — Anesthesia Preprocedure Evaluation (Signed)
Anesthesia Evaluation  Patient identified by MRN, date of birth, ID band Patient awake    Reviewed: Allergy & Precautions, H&P , NPO status , Patient's Chart, lab work & pertinent test results, reviewed documented beta blocker date and time   Airway Mallampati: I      Dental  (+) Edentulous Upper   Pulmonary          Cardiovascular hypertension, Pt. on medications and Pt. on home beta blockers + CAD and + CABG + dysrhythmias Atrial Fibrillation Rhythm:Irregular Rate:Normal     Neuro/Psych    GI/Hepatic   Endo/Other  Diabetes mellitus-, Type 2, Oral Hypoglycemic Agents  Renal/GU      Musculoskeletal   Abdominal   Peds  Hematology   Anesthesia Other Findings   Reproductive/Obstetrics                           Anesthesia Physical Anesthesia Plan  ASA: III  Anesthesia Plan: General   Post-op Pain Management:    Induction: Intravenous  Airway Management Planned: Mask  Additional Equipment:   Intra-op Plan:   Post-operative Plan:   Informed Consent: I have reviewed the patients History and Physical, chart, labs and discussed the procedure including the risks, benefits and alternatives for the proposed anesthesia with the patient or authorized representative who has indicated his/her understanding and acceptance.     Plan Discussed with:   Anesthesia Plan Comments:         Anesthesia Quick Evaluation

## 2011-09-11 NOTE — Anesthesia Postprocedure Evaluation (Signed)
  Anesthesia Post-op Note  Patient: [name]  Procedure(s) Performed: Procedure(s) (LRB): CARDIOVERSION (N/A)  Patient Location: Short Stay  Anesthesia Type: General  Level of Consciousness: awake, alert , oriented and patient cooperative  Airway and Oxygen Therapy: Patient Spontanous Breathing and Patient connected to nasal cannula oxygen  Post-op Pain: none  Post-op Assessment: Post-op Vital signs reviewed, Patient's Cardiovascular Status Stable, Respiratory Function Stable and Patent Airway  Post-op Vital Signs: Reviewed and stable  Complications: No apparent anesthesia complications

## 2011-09-11 NOTE — H&P (Signed)
History reviewed, patient examined, no change in status, stable for surgery. Sanda Klein, MD, [place] [phone] office [phone] pager

## 2011-09-11 NOTE — Transfer of Care (Signed)
Immediate Anesthesia Transfer of Care Note  Patient: [name]  Procedure(s) Performed: Procedure(s) (LRB): CARDIOVERSION (N/A)  Patient Location: Short Stay  Anesthesia Type: General  Level of Consciousness: awake, alert  and oriented  Airway & Oxygen Therapy: Patient Spontanous Breathing and Patient connected to nasal cannula oxygen  Post-op Assessment: Report given to PACU RN and Post -op Vital signs reviewed and stable  Post vital signs: Reviewed and stable  Complications: No apparent anesthesia complications

## 2011-09-13 ENCOUNTER — Ambulatory Visit
Admission: RE | Admit: 2011-09-13 | Discharge: 2011-09-13 | Disposition: A | Discharge status: Home / Self Care | Payer: BC Managed Care – PPO | Source: Ambulatory Visit | Attending: Cardiovascular Disease | Admitting: Cardiovascular Disease

## 2011-09-13 DIAGNOSIS — I771 Stricture of artery: Secondary | ICD-10-CM

## 2011-09-13 MED ORDER — IOHEXOL 350 MG/ML SOLN
100.0000 mL | Freq: Once | INTRAVENOUS | Status: AC | PRN
  Administered 2011-09-13: 100 mL via INTRAVENOUS

## 2011-09-17 ENCOUNTER — Encounter (HOSPITAL_COMMUNITY): Payer: Self-pay | Admitting: Cardiovascular Disease

## 2011-10-15 NOTE — CV Procedure (Signed)
[name],Elaine Female, 61 y.o., 18-May-1951  Procedure: Electrical Cardioversion Indications:  Atrial Fibrillation  Procedure Details:  Consent: Risks of procedure as well as the alternatives and risks of each were explained to the (patient/caregiver).  Consent for procedure obtained.  Time Out: Verified patient identification, verified procedure, site/side was marked, verified correct patient position, special equipment/implants available, medications/allergies/relevent history reviewed, required imaging and test results available.  Performed  Patient placed on cardiac monitor, pulse oximetry, supplemental oxygen as necessary.  Sedation given: propofol, Anesthesiology  Pacer pads placed anterior and posterior chest.  Cardioverted 1 time(s).  Cardioverted at 150J.  Evaluation: Findings: Post procedure EKG shows: NSR Complications: None Patient did tolerate procedure well.  Jesselyn Rask 10/15/2011, 8:38 AM

## 2011-12-03 ENCOUNTER — Other Ambulatory Visit: Payer: Self-pay

## 2011-12-03 DIAGNOSIS — I6529 Occlusion and stenosis of unspecified carotid artery: Secondary | ICD-10-CM

## 2011-12-05 ENCOUNTER — Other Ambulatory Visit: Payer: Self-pay | Admitting: Internal Medicine

## 2011-12-05 DIAGNOSIS — E049 Nontoxic goiter, unspecified: Secondary | ICD-10-CM

## 2011-12-10 ENCOUNTER — Other Ambulatory Visit: Payer: BC Managed Care – PPO

## 2011-12-14 ENCOUNTER — Ambulatory Visit
Admission: RE | Admit: 2011-12-14 | Discharge: 2011-12-14 | Disposition: A | Discharge status: Home / Self Care | Payer: BC Managed Care – PPO | Source: Ambulatory Visit | Attending: Internal Medicine | Admitting: Internal Medicine

## 2011-12-14 DIAGNOSIS — E049 Nontoxic goiter, unspecified: Secondary | ICD-10-CM

## 2011-12-19 ENCOUNTER — Other Ambulatory Visit: Payer: Self-pay | Admitting: Internal Medicine

## 2011-12-19 DIAGNOSIS — E041 Nontoxic single thyroid nodule: Secondary | ICD-10-CM

## 2011-12-26 ENCOUNTER — Ambulatory Visit
Admission: RE | Admit: 2011-12-26 | Discharge: 2011-12-26 | Disposition: A | Discharge status: Home / Self Care | Payer: BC Managed Care – PPO | Source: Ambulatory Visit | Attending: Internal Medicine | Admitting: Internal Medicine

## 2011-12-26 ENCOUNTER — Other Ambulatory Visit (HOSPITAL_COMMUNITY)
Admission: RE | Admit: 2011-12-26 | Discharge: 2011-12-26 | Disposition: A | Discharge status: Home / Self Care | Payer: Medicare Other | Source: Ambulatory Visit | Attending: Interventional Radiology | Admitting: Interventional Radiology

## 2011-12-26 DIAGNOSIS — E041 Nontoxic single thyroid nodule: Secondary | ICD-10-CM

## 2012-01-14 ENCOUNTER — Encounter: Payer: BC Managed Care – PPO | Admitting: Surgery

## 2012-01-14 ENCOUNTER — Other Ambulatory Visit: Payer: BC Managed Care – PPO

## 2012-01-25 ENCOUNTER — Encounter: Payer: Self-pay | Admitting: Surgery

## 2012-01-28 ENCOUNTER — Other Ambulatory Visit (INDEPENDENT_AMBULATORY_CARE_PROVIDER_SITE_OTHER): Payer: Medicare Other | Admitting: *Deleted

## 2012-01-28 ENCOUNTER — Ambulatory Visit (INDEPENDENT_AMBULATORY_CARE_PROVIDER_SITE_OTHER): Payer: Medicare Other | Admitting: Surgery

## 2012-01-28 ENCOUNTER — Encounter: Payer: Self-pay | Admitting: Surgery

## 2012-01-28 VITALS — BP 137/79 | HR 56 | Resp 16 | Ht 64.0 in | Wt 191.9 lb

## 2012-01-28 DIAGNOSIS — I6529 Occlusion and stenosis of unspecified carotid artery: Secondary | ICD-10-CM

## 2012-01-28 NOTE — Progress Notes (Signed)
[facility]   Patient name: [name] MRN: [id number] DOB: Feb 12, 1951 Sex: female   Referred by: Dr. Rollene Fare  Reason for referral:  Chief Complaint  Patient presents with  . New Evaluation    Carotid stenosis. Ref by Dr. Rollene Fare    HISTORY OF PRESENT ILLNESS: The patient is referred for bilateral carotid stenosis, left greater than right. She has been followed with serial ultrasound and recently had a progression of the stenosis on the left side. She is now greater than 80% on the left. She continues to be asymptomatic. Specifically she denies numbness or weakness in either extremity. She denies slurred speech. She denies amaurosis fugax.  Patient has an extensive vascular history. She underwent coronary artery bypass grafting in 2003. She has also undergone popliteal angioplasty in 2005 on the right. 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 She is medically managed for her hypercholesterolemia. She worked for a thyroid nodule. She was started on Coumadin earlier this year for atrial fibrillation.  Past Medical History  Diagnosis Date  . Irregular heart beat   . CAD (coronary artery disease)   . Myocardial infarction 2002  . Peripheral vascular disease   . 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 Cardioversion 09/11/2011    Procedure: CARDIOVERSION;  Surgeon: Sanda Klein, MD;  Location: [facility] OR;  Service: Cardiovascular;  Laterality: N/A;  . Coronary artery bypass graft 2003    History   Social History  . Marital Status: Married    Spouse Name: N/A    Number of Children: N/A  . Years of Education: N/A   Occupational History  . Not on file.   Social History Main Topics  . Smoking status: Former Smoker    Quit date: 07/02/2001  . Smokeless tobacco: Not on file  . Alcohol Use: Yes  . Drug Use: No  . Sexually Active: Not on  file   Other Topics Concern  . Not on file   Social History Narrative  . No narrative on file    Family History  Problem Relation Age of Onset  . Deep vein thrombosis Sister   . Diabetes Sister   . Heart disease Sister   . Hyperlipidemia Sister   . Hypertension Sister   . Diabetes Brother   . Hypertension Son     Allergies as of 01/28/2012  . (No Known Allergies)    Current Outpatient Prescriptions on File Prior to Visit  Medication Sig Dispense Refill  . amLODipine (NORVASC) 10 MG tablet Take 10 mg by mouth daily.      Marland Kitchen aspirin 81 MG tablet Take 81 mg by mouth daily.      Marland Kitchen dronedarone (MULTAQ) 400 MG tablet Take 400 mg by mouth 2 (two) times daily with a meal.      . furosemide (LASIX) 40 MG tablet Take 40 mg by mouth daily.      . isosorbide mononitrate (IMDUR) 30 MG 24 hr tablet Take 30 mg by mouth daily.      . lansoprazole (PREVACID) 15 MG capsule Take 15 mg by mouth daily.      . metFORMIN (GLUCOPHAGE) 1000 MG tablet Take 1,000 mg by mouth 2 (two) times daily with a meal.      . metoprolol (LOPRESSOR) 50 MG tablet Take 0.5 tablets (25 mg total) by mouth 2 (two) times daily.  30 tablet  0  . potassium chloride SA (K-DUR,KLOR-CON) 20 MEQ tablet Take 20 mEq by mouth daily.      . rosuvastatin (CRESTOR) 40 MG tablet Take 40 mg by mouth daily.      . valsartan (DIOVAN) 160 MG tablet Take 160 mg by mouth 2 (two) times daily.      Marland Kitchen warfarin (COUMADIN) 5 MG tablet Take 2.5-5 mg by mouth daily. Take 1 tablet on mondays and fridays. Take 1/2 a tablet the rest of the week.         REVIEW OF SYSTEMS: Cardiovascular: No chest pain, chest pressure, palpitations, orthopnea, or dyspnea on exertion. No claudication or rest pain, positive for varicose vein in the left Pulmonary: No productive cough, asthma or wheezing. Neurologic: No weakness, paresthesias, aphasia, or amaurosis. No dizziness. Hematologic: No bleeding problems or clotting disorders. Musculoskeletal: No joint pain  or joint swelling. Gastrointestinal: No blood in stool or hematemesis Genitourinary: No dysuria or hematuria. Psychiatric:: No history of major depression. Integumentary: No rashes or ulcers. Constitutional: No fever or chills.  PHYSICAL EXAMINATION: General: The patient appears their stated age.  Vital signs are BP 137/79  Pulse 56  Resp 16  Ht 5\' 4"  (1.626 m)  Wt 191 lb 14.4 oz (87.045 kg)  BMI 32.94 kg/m2  SpO2 100% HEENT:  No gross abnormalities Pulmonary: Respirations are non-labored Musculoskeletal: There are no major deformities.   Neurologic: No focal weakness or paresthesias are detected, Skin: There are no ulcer or rashes noted. Psychiatric: The patient has normal affect. Cardiovascular: There is a regular rate and rhythm without significant murmur appreciated. No carotid bruit.  Diagnostic Studies: I have reviewed her outside ultrasound which shows left carotid stenosis and right carotid stenosis. This was repeated in our office because she is such a difficult study. We found greater than 80% left carotid stenosis and 40-59% right carotid stenosis. The lesion was 2 cm in length with a tight area at the distal edge of the lesion. It was difficult to evaluate due to the thyroid nodule. It was felt to be on the high side.  Outside Studies/Documentation Historical records were reviewed.  They showed high-grade left carotid stenosis  Medication Changes: The patient will stop her Coumadin 5 days prior to her procedure and start Plavix. I will keep her on Coumadin aspirin and Plavix for a total of 3 months. At that time she can be off her Plavix  Assessment:  Asymptomatic left carotid stenosis Plan: I have reviewed all the patient's imaging studies including her CT angiogram from March of this year. I feel that she has a very high lesion which may not be accessible surgically. I therefore have recommended proceeding with left carotid stent with distal embolic protection. I  discussed the risks and benefits of the procedure including the risk of stroke, risk of bleeding. All of her questions were answered today. I detailed the procedure to her. She wishes to proceed. She will be off Coumadin beginning 5 days prior to her surgery. I will start her on Plavix at that time. I told her I would continue her on Plavix for a total of 3 months. Her procedure has been scheduled for Tuesday, August 13. This procedure will be done in conjunction with Dr.Berry.     Eldridge Abrahams, M.D. [facility] [place] Office: [phone] Pager:  [phone]

## 2012-01-29 ENCOUNTER — Encounter (HOSPITAL_COMMUNITY): Payer: Self-pay | Admitting: Pharmacy Technician

## 2012-01-30 ENCOUNTER — Other Ambulatory Visit: Payer: Self-pay

## 2012-02-01 ENCOUNTER — Telehealth: Payer: Self-pay | Admitting: *Deleted

## 2012-02-01 NOTE — Telephone Encounter (Signed)
Per patient's request, I called Dr. Lowella Fairy office [facility]) to make them aware of Dr. Stephens Shire plan of stopping pt's Coumadin 5 days prior to 02-12-12 CEA surgery. Plan is to start Plavix and continue plavix for 3 months postop.    Cloyde Reams said she would give this message to the appropriate people in the Coumadin clinic. Ms Wiedmann said her next appt with them was scheduled for 02-21-12.

## 2012-02-04 NOTE — Procedures (Unsigned)
CAROTID DUPLEX EXAM  INDICATION:  Verify critical stenosis found on outside facility's exam.  HISTORY: Diabetes:  Yes Cardiac:  Yes Hypertension:  Yes Smoking:  Previous Previous Surgery:  No CV History: Amaurosis Fugax No, Paresthesias No, Hemiparesis No                                      RIGHT                LEFT Brachial systolic pressure: Brachial Doppler waveforms: Vertebral direction of flow: DUPLEX VELOCITIES (cm/sec) CCA peak systolic                   54                   73 ECA peak systolic ICA peak systolic                   155                  0000000 ICA end diastolic                   52                   205 PLAQUE MORPHOLOGY:                  Heterogeneous/calcific                   Heterogeneous/calcific PLAQUE AMOUNT:                      Moderate             Severe PLAQUE LOCATION:                    ICA                  ICA  IMPRESSION: 1. 40%-59% right internal carotid artery stenosis. 2. 80%-99% left internal carotid artery stenosis; lesion is     approximately 2 cm in length with calcific plaque.  The tightest     area appears to be at the distal edge of the lesion. 3. Technically difficult exam due to high bifurcation and large     goiter.  ___________________________________________ V. Leia Alf, MD  LT/MEDQ  D:  01/28/2012  T:  01/28/2012  Job:  [id number]

## 2012-02-12 ENCOUNTER — Telehealth: Payer: Self-pay | Admitting: Surgery

## 2012-02-12 ENCOUNTER — Inpatient Hospital Stay (HOSPITAL_COMMUNITY)
Admission: RE | Admit: 2012-02-12 | Discharge: 2012-02-15 | DRG: 036 | Disposition: A | Discharge status: Home / Self Care | Payer: Medicare Other | Source: Ambulatory Visit | Attending: Surgery | Admitting: Surgery

## 2012-02-12 ENCOUNTER — Encounter (HOSPITAL_COMMUNITY)
Admission: RE | Disposition: A | Discharge status: Home / Self Care | Payer: Medicare Other | Source: Ambulatory Visit | Attending: Surgery

## 2012-02-12 DIAGNOSIS — I4891 Unspecified atrial fibrillation: Secondary | ICD-10-CM | POA: Diagnosis present

## 2012-02-12 DIAGNOSIS — Z8544 Personal history of malignant neoplasm of other female genital organs: Secondary | ICD-10-CM

## 2012-02-12 DIAGNOSIS — Z79899 Other long term (current) drug therapy: Secondary | ICD-10-CM

## 2012-02-12 DIAGNOSIS — I480 Paroxysmal atrial fibrillation: Secondary | ICD-10-CM | POA: Diagnosis present

## 2012-02-12 DIAGNOSIS — I2510 Atherosclerotic heart disease of native coronary artery without angina pectoris: Secondary | ICD-10-CM | POA: Diagnosis present

## 2012-02-12 DIAGNOSIS — I6529 Occlusion and stenosis of unspecified carotid artery: Principal | ICD-10-CM | POA: Diagnosis present

## 2012-02-12 DIAGNOSIS — Z7901 Long term (current) use of anticoagulants: Secondary | ICD-10-CM

## 2012-02-12 DIAGNOSIS — I739 Peripheral vascular disease, unspecified: Secondary | ICD-10-CM | POA: Diagnosis present

## 2012-02-12 DIAGNOSIS — I10 Essential (primary) hypertension: Secondary | ICD-10-CM | POA: Diagnosis present

## 2012-02-12 DIAGNOSIS — R6884 Jaw pain: Secondary | ICD-10-CM | POA: Diagnosis not present

## 2012-02-12 DIAGNOSIS — Z951 Presence of aortocoronary bypass graft: Secondary | ICD-10-CM

## 2012-02-12 DIAGNOSIS — E785 Hyperlipidemia, unspecified: Secondary | ICD-10-CM | POA: Diagnosis present

## 2012-02-12 DIAGNOSIS — I495 Sick sinus syndrome: Secondary | ICD-10-CM | POA: Diagnosis not present

## 2012-02-12 DIAGNOSIS — R001 Bradycardia, unspecified: Secondary | ICD-10-CM | POA: Diagnosis not present

## 2012-02-12 DIAGNOSIS — I658 Occlusion and stenosis of other precerebral arteries: Secondary | ICD-10-CM | POA: Diagnosis present

## 2012-02-12 DIAGNOSIS — E7800 Pure hypercholesterolemia, unspecified: Secondary | ICD-10-CM | POA: Diagnosis present

## 2012-02-12 DIAGNOSIS — I959 Hypotension, unspecified: Secondary | ICD-10-CM | POA: Diagnosis not present

## 2012-02-12 DIAGNOSIS — Z7982 Long term (current) use of aspirin: Secondary | ICD-10-CM

## 2012-02-12 DIAGNOSIS — E119 Type 2 diabetes mellitus without complications: Secondary | ICD-10-CM | POA: Diagnosis present

## 2012-02-12 DIAGNOSIS — I252 Old myocardial infarction: Secondary | ICD-10-CM

## 2012-02-12 HISTORY — PX: CAROTID ANGIOGRAM: SHX5504

## 2012-02-12 HISTORY — PX: CAROTID STENT: SHX1301

## 2012-02-12 HISTORY — PX: CAROTID STENT INSERTION: SHX5505

## 2012-02-12 LAB — POCT I-STAT, CHEM 8
Calcium, Ion: 1.26 mmol/L (ref 1.13–1.30)
Chloride: 106 mEq/L (ref 96–112)
Creatinine, Ser: 0.8 mg/dL (ref 0.50–1.10)
Glucose, Bld: 121 mg/dL — ABNORMAL HIGH (ref 70–99)
HCT: 43 % (ref 36.0–46.0)
Hemoglobin: 14.6 g/dL (ref 12.0–15.0)

## 2012-02-12 LAB — PROTIME-INR
INR: 1.08 (ref 0.00–1.49)
Prothrombin Time: 14.2 seconds (ref 11.6–15.2)

## 2012-02-12 LAB — POCT ACTIVATED CLOTTING TIME: Activated Clotting Time: 399 seconds

## 2012-02-12 LAB — APTT: aPTT: 32 seconds (ref 24–37)

## 2012-02-12 LAB — CBC
MCHC: 34.4 g/dL (ref 30.0–36.0)
Platelets: 204 10*3/uL (ref 150–400)
RDW: 16 % — ABNORMAL HIGH (ref 11.5–15.5)

## 2012-02-12 SURGERY — CAROTID STENT INSERTION
Anesthesia: LOCAL

## 2012-02-12 MED ORDER — METOPROLOL TARTRATE 100 MG PO TABS
100.0000 mg | ORAL_TABLET | Freq: Two times a day (BID) | ORAL | Status: DC
  Filled 2012-02-12 (×3): qty 1

## 2012-02-12 MED ORDER — SODIUM CHLORIDE 0.9 % IV BOLUS (SEPSIS)
500.0000 mL | Freq: Once | INTRAVENOUS | Status: AC
  Administered 2012-02-12 (×2): 500 mL via INTRAVENOUS

## 2012-02-12 MED ORDER — HYDRALAZINE HCL 20 MG/ML IJ SOLN: 10.0000 mg | INTRAMUSCULAR | Status: DC | PRN

## 2012-02-12 MED ORDER — METOPROLOL TARTRATE 1 MG/ML IV SOLN: 2.0000 mg | INTRAVENOUS | Status: DC | PRN

## 2012-02-12 MED ORDER — ACETAMINOPHEN 325 MG PO TABS: 325.0000 mg | ORAL_TABLET | ORAL | Status: DC | PRN

## 2012-02-12 MED ORDER — LIDOCAINE HCL (PF) 1 % IJ SOLN
INTRAMUSCULAR | Status: AC
  Filled 2012-02-12: qty 30

## 2012-02-12 MED ORDER — SODIUM CHLORIDE 0.9 % IV SOLN: INTRAVENOUS | Status: DC

## 2012-02-12 MED ORDER — DOPAMINE-DEXTROSE 3.2-5 MG/ML-% IV SOLN
5.0000 ug/kg/min | INTRAVENOUS | Status: DC
  Administered 2012-02-12: 5 ug/kg/min via INTRAVENOUS
  Filled 2012-02-12: qty 250

## 2012-02-12 MED ORDER — ASPIRIN 81 MG PO TABS: 81.0000 mg | ORAL_TABLET | Freq: Every day | ORAL | Status: DC

## 2012-02-12 MED ORDER — BIVALIRUDIN 250 MG IV SOLR
INTRAVENOUS | Status: AC
  Filled 2012-02-12: qty 250

## 2012-02-12 MED ORDER — ISOSORBIDE MONONITRATE ER 30 MG PO TB24
30.0000 mg | ORAL_TABLET | Freq: Every day | ORAL | Status: DC
Start: 2012-02-13 — End: 2012-02-13
  Filled 2012-02-12: qty 1

## 2012-02-12 MED ORDER — SODIUM CHLORIDE 0.9 % IV SOLN
INTRAVENOUS | Status: DC
  Administered 2012-02-12: 1000 mL via INTRAVENOUS

## 2012-02-12 MED ORDER — ONDANSETRON HCL 4 MG/2ML IJ SOLN
4.0000 mg | Freq: Four times a day (QID) | INTRAMUSCULAR | Status: DC | PRN
  Administered 2012-02-12: 4 mg via INTRAVENOUS
  Filled 2012-02-12: qty 2

## 2012-02-12 MED ORDER — ACETAMINOPHEN 650 MG RE SUPP: 325.0000 mg | RECTAL | Status: DC | PRN

## 2012-02-12 MED ORDER — CLOPIDOGREL BISULFATE 75 MG PO TABS
75.0000 mg | ORAL_TABLET | Freq: Every day | ORAL | Status: DC
  Administered 2012-02-13 – 2012-02-15 (×3): 75 mg via ORAL
  Filled 2012-02-12 (×4): qty 1

## 2012-02-12 MED ORDER — ATROPINE SULFATE 1 MG/ML IJ SOLN
INTRAMUSCULAR | Status: AC
  Filled 2012-02-12: qty 1

## 2012-02-12 MED ORDER — AMLODIPINE BESYLATE 10 MG PO TABS
10.0000 mg | ORAL_TABLET | Freq: Every day | ORAL | Status: DC
  Filled 2012-02-12: qty 1

## 2012-02-12 MED ORDER — ALUM & MAG HYDROXIDE-SIMETH 200-200-20 MG/5ML PO SUSP: 15.0000 mL | ORAL | Status: DC | PRN

## 2012-02-12 MED ORDER — IRBESARTAN 75 MG PO TABS
75.0000 mg | ORAL_TABLET | Freq: Every day | ORAL | Status: DC
  Filled 2012-02-12: qty 1

## 2012-02-12 MED ORDER — SODIUM CHLORIDE 0.9 % IV SOLN
0.2500 mg/kg/h | INTRAVENOUS | Status: AC
  Administered 2012-02-12: 0.25 mg/kg/h via INTRAVENOUS
  Filled 2012-02-12: qty 250

## 2012-02-12 MED ORDER — ASPIRIN EC 81 MG PO TBEC
81.0000 mg | DELAYED_RELEASE_TABLET | Freq: Every day | ORAL | Status: DC
  Administered 2012-02-13 – 2012-02-15 (×3): 81 mg via ORAL
  Filled 2012-02-12 (×3): qty 1

## 2012-02-12 MED ORDER — OXYCODONE-ACETAMINOPHEN 5-325 MG PO TABS
1.0000 | ORAL_TABLET | ORAL | Status: DC | PRN
  Administered 2012-02-14 (×2): 1 via ORAL
  Administered 2012-02-15: 2 via ORAL
  Filled 2012-02-12: qty 2
  Filled 2012-02-12 (×2): qty 1

## 2012-02-12 MED ORDER — HEPARIN (PORCINE) IN NACL 2-0.9 UNIT/ML-% IJ SOLN
INTRAMUSCULAR | Status: AC
  Filled 2012-02-12: qty 1000

## 2012-02-12 MED ORDER — LABETALOL HCL 5 MG/ML IV SOLN: 10.0000 mg | INTRAVENOUS | Status: DC | PRN

## 2012-02-12 MED ORDER — FUROSEMIDE 40 MG PO TABS
40.0000 mg | ORAL_TABLET | Freq: Every day | ORAL | Status: DC
  Filled 2012-02-12: qty 1

## 2012-02-12 MED ORDER — GUAIFENESIN-DM 100-10 MG/5ML PO SYRP: 15.0000 mL | ORAL_SOLUTION | ORAL | Status: DC | PRN

## 2012-02-12 MED ORDER — POTASSIUM CHLORIDE CRYS ER 20 MEQ PO TBCR
20.0000 meq | EXTENDED_RELEASE_TABLET | Freq: Every day | ORAL | Status: DC
  Administered 2012-02-13 – 2012-02-15 (×3): 20 meq via ORAL
  Filled 2012-02-12 (×3): qty 1

## 2012-02-12 MED ORDER — ATORVASTATIN CALCIUM 10 MG PO TABS
10.0000 mg | ORAL_TABLET | Freq: Every day | ORAL | Status: DC
  Administered 2012-02-12 – 2012-02-14 (×2): 10 mg via ORAL
  Filled 2012-02-12 (×4): qty 1

## 2012-02-12 MED ORDER — NOREPINEPHRINE BITARTRATE 1 MG/ML IJ SOLN
INTRAMUSCULAR | Status: AC
  Filled 2012-02-12: qty 4

## 2012-02-12 MED ORDER — PHENOL 1.4 % MT LIQD: 1.0000 | OROMUCOSAL | Status: DC | PRN

## 2012-02-12 MED ORDER — DRONEDARONE HCL 400 MG PO TABS
400.0000 mg | ORAL_TABLET | Freq: Two times a day (BID) | ORAL | Status: DC
  Administered 2012-02-12: 400 mg via ORAL
  Filled 2012-02-12 (×4): qty 1

## 2012-02-12 MED ORDER — PANTOPRAZOLE SODIUM 20 MG PO TBEC
20.0000 mg | DELAYED_RELEASE_TABLET | Freq: Every day | ORAL | Status: DC
  Administered 2012-02-12 – 2012-02-15 (×4): 20 mg via ORAL
  Filled 2012-02-12 (×4): qty 1

## 2012-02-12 MED ORDER — MORPHINE SULFATE 2 MG/ML IJ SOLN
2.0000 mg | INTRAMUSCULAR | Status: DC | PRN
Start: 2012-02-12 — End: 2012-02-15

## 2012-02-12 NOTE — Progress Notes (Addendum)
Pt arrived from cath lab, VSS, pulses pap R DP, warm-dry skin, R groin site C/D/I. Oriented to unit and routine. Family at bedside, and call bell within reach. Will continue to monitor.

## 2012-02-12 NOTE — Research (Signed)
[employer] Informed Consent   Subject Name: [name]  Subject met inclusion and exclusion criteria.  The informed consent form, study requirements and expectations were reviewed with the subject and questions and concerns were addressed prior to the signing of the consent form.  The subject verbalized understanding of the trial requirements.  The subject agreed to participate in the [facility] trial and signed the informed consent.  The informed consent was obtained prior to performance of any protocol-specific procedures for the subject.  A copy of the signed informed consent was given to the subject and a copy was placed in the subject's medical record.  [name] 02/12/2012, 9:36 AM

## 2012-02-12 NOTE — H&P (View-Only) (Signed)
Vascular and Vein Specialist of [facility]   Patient name: [name] MRN: [id number] DOB: 1950/10/30 Sex: female   Referred by: Dr. Rollene Fare  Reason for referral:  Chief Complaint  Patient presents with  . New Evaluation    Carotid stenosis. Ref by Dr. Rollene Fare    HISTORY OF PRESENT ILLNESS: The patient is referred for bilateral carotid stenosis, left greater than right. She has been followed with serial ultrasound and recently had a progression of the stenosis on the left side. She is now greater than 80% on the left. She continues to be asymptomatic. Specifically she denies numbness or weakness in either extremity. She denies slurred speech. She denies amaurosis fugax.  Patient has an extensive vascular history. She underwent coronary artery bypass grafting in 2003. She has also undergone popliteal angioplasty in 2005 on the right. 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 She is medically managed for her hypercholesterolemia. She worked for a thyroid nodule. She was started on Coumadin earlier this year for atrial fibrillation.  Past Medical History  Diagnosis Date  . Irregular heart beat   . CAD (coronary artery disease)   . Myocardial infarction 2002  . Peripheral vascular disease   . 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 Cardioversion 09/11/2011    Procedure: CARDIOVERSION;  Surgeon: Sanda Klein, MD;  Location: [facility] OR;  Service: Cardiovascular;  Laterality: N/A;  . Coronary artery bypass graft 2003    History   Social History  . Marital Status: Married    Spouse Name: N/A    Number of Children: N/A  . Years of Education: N/A   Occupational History  . Not on file.   Social History Main Topics  . Smoking status: Former Smoker    Quit date: 07/02/2001  . Smokeless tobacco: Not on file  . Alcohol Use: Yes  . Drug Use: No  . Sexually Active: Not on  file   Other Topics Concern  . Not on file   Social History Narrative  . No narrative on file    Family History  Problem Relation Age of Onset  . Deep vein thrombosis Sister   . Diabetes Sister   . Heart disease Sister   . Hyperlipidemia Sister   . Hypertension Sister   . Diabetes Brother   . Hypertension Son     Allergies as of 01/28/2012  . (No Known Allergies)    Current Outpatient Prescriptions on File Prior to Visit  Medication Sig Dispense Refill  . amLODipine (NORVASC) 10 MG tablet Take 10 mg by mouth daily.      Marland Kitchen aspirin 81 MG tablet Take 81 mg by mouth daily.      Marland Kitchen dronedarone (MULTAQ) 400 MG tablet Take 400 mg by mouth 2 (two) times daily with a meal.      . furosemide (LASIX) 40 MG tablet Take 40 mg by mouth daily.      . isosorbide mononitrate (IMDUR) 30 MG 24 hr tablet Take 30 mg by mouth daily.      . lansoprazole (PREVACID) 15 MG capsule Take 15 mg by mouth daily.      . metFORMIN (GLUCOPHAGE) 1000 MG tablet Take 1,000 mg by mouth 2 (two) times daily with a meal.      . metoprolol (LOPRESSOR) 50 MG tablet Take 0.5 tablets (25 mg total) by mouth 2 (two) times daily.  30 tablet  0  . potassium chloride SA (K-DUR,KLOR-CON) 20 MEQ tablet Take 20 mEq by mouth daily.      . rosuvastatin (CRESTOR) 40 MG tablet Take 40 mg by mouth daily.      . valsartan (DIOVAN) 160 MG tablet Take 160 mg by mouth 2 (two) times daily.      Marland Kitchen warfarin (COUMADIN) 5 MG tablet Take 2.5-5 mg by mouth daily. Take 1 tablet on mondays and fridays. Take 1/2 a tablet the rest of the week.         REVIEW OF SYSTEMS: Cardiovascular: No chest pain, chest pressure, palpitations, orthopnea, or dyspnea on exertion. No claudication or rest pain, positive for varicose vein in the left Pulmonary: No productive cough, asthma or wheezing. Neurologic: No weakness, paresthesias, aphasia, or amaurosis. No dizziness. Hematologic: No bleeding problems or clotting disorders. Musculoskeletal: No joint pain  or joint swelling. Gastrointestinal: No blood in stool or hematemesis Genitourinary: No dysuria or hematuria. Psychiatric:: No history of major depression. Integumentary: No rashes or ulcers. Constitutional: No fever or chills.  PHYSICAL EXAMINATION: General: The patient appears their stated age.  Vital signs are BP 137/79  Pulse 56  Resp 16  Ht 5\' 4"  (1.626 m)  Wt 191 lb 14.4 oz (87.045 kg)  BMI 32.94 kg/m2  SpO2 100% HEENT:  No gross abnormalities Pulmonary: Respirations are non-labored Musculoskeletal: There are no major deformities.   Neurologic: No focal weakness or paresthesias are detected, Skin: There are no ulcer or rashes noted. Psychiatric: The patient has normal affect. Cardiovascular: There is a regular rate and rhythm without significant murmur appreciated. No carotid bruit.  Diagnostic Studies: I have reviewed her outside ultrasound which shows left carotid stenosis and right carotid stenosis. This was repeated in our office because she is such a difficult study. We found greater than 80% left carotid stenosis and 40-59% right carotid stenosis. The lesion was 2 cm in length with a tight area at the distal edge of the lesion. It was difficult to evaluate due to the thyroid nodule. It was felt to be on the high side.  Outside Studies/Documentation Historical records were reviewed.  They showed high-grade left carotid stenosis  Medication Changes: The patient will stop her Coumadin 5 days prior to her procedure and start Plavix. I will keep her on Coumadin aspirin and Plavix for a total of 3 months. At that time she can be off her Plavix  Assessment:  Asymptomatic left carotid stenosis Plan: I have reviewed all the patient's imaging studies including her CT angiogram from March of this year. I feel that she has a very high lesion which may not be accessible surgically. I therefore have recommended proceeding with left carotid stent with distal embolic protection. I  discussed the risks and benefits of the procedure including the risk of stroke, risk of bleeding. All of her questions were answered today. I detailed the procedure to her. She wishes to proceed. She will be off Coumadin beginning 5 days prior to her surgery. I will start her on Plavix at that time. I told her I would continue her on Plavix for a total of 3 months. Her procedure has been scheduled for Tuesday, August 13. This procedure will be done in conjunction with Dr.Berry.     Eldridge Abrahams, M.D. [facility] [place] Office: [phone] Pager:  [phone]

## 2012-02-12 NOTE — Op Note (Signed)
Vascular and Vein Specialists of [facility]  Patient name: [name] MRN: [id number] DOB: 18-Jan-1951 Sex: female  02/12/2012 Pre-operative Diagnosis: Asymptomatic Left carotid stenosis Post-operative diagnosis:  Same Surgeon:  Eldridge Abrahams Co-surgeon:  Quay Burow Procedure Performed:  1.  ultrasound access right femoral artery  2.  aortic arch angiogram  3.  second order catheterization (right common carotid artery)  4.  right carotid angiogram  5.  left carotid stenting with distal embolic    Indications:  The patient has been followed with serial ultrasounds for her carotid stenosis. She was recently found to have a progression of the disease on the left to where it is greater than 80%. She remained asymptomatic. In reviewing her records I found a CT angiogram of her neck from March that identified her stenosis. After my review of the scan I felt that her lesion was very high and that she would be better served with carotid stenting to do to the high bifurcation and distal extent of her disease.  Procedure:  The patient was identified in the holding area and taken to room 8.  The patient was then placed supine on the table and prepped and draped in the usual sterile fashion.  A time out was called.  Ultrasound was used to evaluate the right common femoral artery.  It was patent .  A digital ultrasound image was acquired.  A micropuncture needle was used to access the right common femoral artery under ultrasound guidance.  An 018 wire was advanced without resistance and a micropuncture sheath was placed.  The 018 wire was removed and a benson wire was placed.  The micropuncture sheath was exchanged for a 5 french sheath. A pigtail catheter was advanced into the descending aorta and an aortic arch angiogram was performed. Next the right common carotid artery was selected with a Berenstein 2 catheter and a right  carotid angiogram was performed with the catheter in the common carotid  artery. I then performed images of the left carotid artery by selecting it with a Berenstein 2 catheter. Intracranial images were also obtained which will be dictated separately by neuroradiology. Findings:   Aortic arch:  A type II aortic arch is identified. The innominate and right subclavian artery are widely patent without significant pathology. The right common carotid artery is patent throughout it's course. The proximal left common carotid artery is widely patent the proximal left subclavian artery is widely patent.  Right carotid angiogram:  The right common carotid artery is patent throughout it's course right external carotid artery is widely patent. There is approximately 60% stenosis at the carotid bifurcation extending into the internal carotid artery.  Left carotid angiogram:  Left common carotid artery is widely patent. The external carotid artery is widely patent. The bifurcation is at the level of C2 and the disease extends above the angle of the mandible to the top portion of C2 around is a tortuous portion of the carotid artery. At the distal end of the disease at approximately 85% stenosis is identified.  Intervention:  After the above images were obtained the decision was made to proceed with carotid stenting. An Angiomax bolus and drip was initiated. The ACT was confirmed to be greater than 300. Over a Amplatz superstiff wire a 6 French sheath was placed. The left carotid artery was then selected using a JB 1 guide catheter and a woolly wire. The JB 1 catheter was advanced into the distal left common carotid artery. The sheath was then  advanced over the guide catheter. Selected images were obtained to define the best view to proceed. A 6 mm Angioguard filter was prepared on the back table and then advanced across the stenosis into the distal internal carotid artery and fully deployed. The stenosis was then predilated with a 3 x 2 balloon. The patient did receive a half a milligram of  atropine at this time. The stent was then prepared on the back table. This was a Cordis 9 x 40 Precise stent. It was successfully deployed and then molded to confirmation with a 5 mm balloon. Completion angiogram revealed resolution of the stenosis to less than 10%. The filter was then retrieved. The long 6 French sheath was exchanged out for a short 6 [place] sheath. The patient was taken to the holding area for sheath pull once her coagulation profile corrects. The patient was neurologically intact at the end of the procedure.  Impression:  #1  successful stenting of the left carotid stenosis using a 9 x 40 stent and a 6 mm filter  #2  60% stenosis within the right carotid artery.    Theotis Burrow, M.D. [facility] [place] Office: [phone] Pager:  [phone]

## 2012-02-12 NOTE — Interval H&P Note (Signed)
History and Physical Interval Note:  02/12/2012 9:18 AM  [name]  has presented today for surgery, with the diagnosis of Carotid stenosis  The various methods of treatment have been discussed with the patient and family. After consideration of risks, benefits and other options for treatment, the patient has consented to  Procedure(s) (LRB): CAROTID STENT INSERTION (N/A) as a surgical intervention .  The patient's history has been reviewed, patient examined, no change in status, stable for surgery.  I have reviewed the patient's chart and labs.  Questions were answered to the patient's satisfaction.     BRABHAM IV, V. WELLS

## 2012-02-12 NOTE — Progress Notes (Signed)
Spoke with on call MD, Dr. Scot Dock, regarding patient's bp 87/40 and HR in upper 30's to lower 40's.  R groin is a level O, R DP palpable.  Received orders and will continue to monitor.  Braxton Cellar RN

## 2012-02-12 NOTE — Telephone Encounter (Addendum)
Message copied by Lujean Amel on Tue Feb 12, 2012  4:47 PM ------      Message from: Alfonso Patten      Created: Tue Feb 12, 2012  3:55 PM                   ----- Message -----         From: Serafina Mitchell, MD         Sent: 02/12/2012  11:10 AM           To: Patrici Ranks, Alfonso Patten, RN            02/12/2012, the patient had the following procedures:             1.  ultrasound access right femoral artery       2.  aortic arch angiogram       3.  second order catheterization (right common carotid                                 artery)       4.  right carotid angiogram       5.  left carotid stenting with distal embolic                        This was done with Dr. Gwenlyn Found.  I will be the filling physician for all portions of the procedure including the diagnostic and the stent insertion.            Please schedule the patient to come back to see me in the office in one month with a carotid duplex prior to her visit.       I scheduled an appt for the above patient on 03/17/12 at 10am for a carotid duplex and see VWB on 11:15am. I was unable to leave a voice message so I mailed appt letter. awt

## 2012-02-12 NOTE — Progress Notes (Signed)
BP 86/38 after two 500 cc NS bolus' administered.  Starting Dopamine gtt at this time per Dr. Scot Dock order.  Will continue to monitor.  [place] Cellar RN

## 2012-02-12 NOTE — Progress Notes (Signed)
Utilization review completed.  

## 2012-02-13 ENCOUNTER — Other Ambulatory Visit: Payer: Self-pay | Admitting: *Deleted

## 2012-02-13 DIAGNOSIS — R6884 Jaw pain: Secondary | ICD-10-CM | POA: Diagnosis not present

## 2012-02-13 DIAGNOSIS — I6529 Occlusion and stenosis of unspecified carotid artery: Secondary | ICD-10-CM

## 2012-02-13 DIAGNOSIS — I959 Hypotension, unspecified: Secondary | ICD-10-CM | POA: Diagnosis not present

## 2012-02-13 DIAGNOSIS — E119 Type 2 diabetes mellitus without complications: Secondary | ICD-10-CM | POA: Diagnosis present

## 2012-02-13 DIAGNOSIS — I10 Essential (primary) hypertension: Secondary | ICD-10-CM | POA: Diagnosis present

## 2012-02-13 DIAGNOSIS — I480 Paroxysmal atrial fibrillation: Secondary | ICD-10-CM | POA: Diagnosis present

## 2012-02-13 DIAGNOSIS — Z48812 Encounter for surgical aftercare following surgery on the circulatory system: Secondary | ICD-10-CM

## 2012-02-13 DIAGNOSIS — I2510 Atherosclerotic heart disease of native coronary artery without angina pectoris: Secondary | ICD-10-CM | POA: Diagnosis present

## 2012-02-13 DIAGNOSIS — I739 Peripheral vascular disease, unspecified: Secondary | ICD-10-CM | POA: Diagnosis present

## 2012-02-13 DIAGNOSIS — E785 Hyperlipidemia, unspecified: Secondary | ICD-10-CM | POA: Diagnosis present

## 2012-02-13 DIAGNOSIS — Z7901 Long term (current) use of anticoagulants: Secondary | ICD-10-CM

## 2012-02-13 DIAGNOSIS — R001 Bradycardia, unspecified: Secondary | ICD-10-CM | POA: Diagnosis not present

## 2012-02-13 LAB — CARDIAC PANEL(CRET KIN+CKTOT+MB+TROPI)
CK, MB: 1.6 ng/mL (ref 0.3–4.0)
Relative Index: INVALID (ref 0.0–2.5)
Relative Index: INVALID (ref 0.0–2.5)
Total CK: 87 U/L (ref 7–177)
Troponin I: <0.3 ng/mL (ref <0.30)

## 2012-02-13 LAB — BASIC METABOLIC PANEL
Calcium: 8.4 mg/dL (ref 8.4–10.5)
GFR calc non Af Amer: >90 mL/min (ref >90)
Potassium: 3.5 mEq/L (ref 3.5–5.1)
Sodium: 143 mEq/L (ref 135–145)

## 2012-02-13 LAB — CBC
Hemoglobin: 12.1 g/dL (ref 12.0–15.0)
Platelets: 215 10*3/uL (ref 150–400)
RBC: 3.98 MIL/uL (ref 3.87–5.11)
WBC: 7.7 10*3/uL (ref 4.0–10.5)

## 2012-02-13 MED FILL — Dextrose Inj 5%: INTRAVENOUS | Qty: 1000 | Status: AC

## 2012-02-13 NOTE — Progress Notes (Signed)
RN by to check on pt after breakfast-Pt HR 38-40's, SBP 80-90, states "lightheaded and jaw is tight, 8/10 pain, not having pain any where else and jaw pain is not radiating." denies Chest pain. pt ambulated back to bed, with increased work of breathing O2 sats 98%, placed on 2L/min [place], and restarted on dopamine gtt. PA, [employer], notified of change and new orders given.  Cardiology informed and will be by to see pt. Am BP meds held, EKG ordered and completed.  Will continue to monitor for further changes.    1115 am pt now in bed resting, dopamine still going at 2.53mcg, Kerin Ransom, PA at bedside.

## 2012-02-13 NOTE — Consult Note (Signed)
Reason for Consult: Bradycardia/ Hypotension  Requesting Physician: Dr Trula Slade  HPI: This is a 61 y.o. female with a past medical history significant for PVD, PAF, and CAD. She had CABG X 3 with LIMA-LAD, SVG-OM,SVG-RCA 2004. Re studied in 2005 and this showed patent grafts. She does have distal LAD and OM disease. Her last Myoview was in 7/12 and was low risk. She had AF in Jan 2013. In March of 2013 she had DCCV. Dr Rollene Fare saw her in May of 2013 and EKG then showed NSR/SB on Metoprolol and Multaq. He cut her Lopressor to 50mg  BID then. She had PVD and has had prior renal and popliteal stenting. She recently had follow up carotid dopplers showing an increase in velocities and she was referred to Dr Trula Slade. She had Lt carotid stenting 02/12/12 and post op she was hypotensive and bradycardic. This am she had transient jaw pain with nausea. She has not had chest pain, jaw pain, fatigue, syncope or near syncope as an OP.We are asked to see her in consult. She is stable currently on Dopamine.  PMHx:  Past Medical History  Diagnosis Date  . Irregular heart beat   . CAD (coronary artery disease)   . Myocardial infarction 2002  . Peripheral vascular disease   . 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 Cardioversion 09/11/2011    Procedure: CARDIOVERSION;  Surgeon: Sanda Klein, MD;  Location: [facility] OR;  Service: Cardiovascular;  Laterality: N/A;  . Coronary artery bypass graft 2003    FAMHx: Family History  Problem Relation Age of Onset  . Deep vein thrombosis Sister   . Diabetes Sister   . Heart disease Sister   . Hyperlipidemia Sister   . Hypertension Sister   . Diabetes Brother   . Hypertension Son     SOCHx:  reports that she quit smoking about 10 years ago. She does not have any smokeless tobacco history on file. She reports that she drinks alcohol. She reports that she does not use illicit drugs.  ALLERGIES: No Known Allergies  ROS: Pertinent  items are noted in HPI.  HOME MEDICATIONS: Prescriptions prior to admission  Medication Sig Dispense Refill  . amLODipine (NORVASC) 10 MG tablet Take 10 mg by mouth daily.      Marland Kitchen aspirin 81 MG tablet Take 81 mg by mouth daily.      Marland Kitchen dronedarone (MULTAQ) 400 MG tablet Take 400 mg by mouth 2 (two) times daily with a meal.      . furosemide (LASIX) 40 MG tablet Take 40 mg by mouth daily.      . isosorbide mononitrate (IMDUR) 30 MG 24 hr tablet Take 30 mg by mouth daily.      . lansoprazole (PREVACID) 15 MG capsule Take 15 mg by mouth daily.      . metFORMIN (GLUCOPHAGE) 1000 MG tablet Take 1,000 mg by mouth 2 (two) times daily with a meal.      . metoprolol (LOPRESSOR) 50 MG tablet Take 100 mg by mouth 2 (two) times daily.      . potassium chloride SA (K-DUR,KLOR-CON) 20 MEQ tablet Take 20 mEq by mouth daily.      . rosuvastatin (CRESTOR) 40 MG tablet Take 40 mg by mouth daily.      . valsartan (DIOVAN) 160 MG tablet Take 160 mg by mouth 2 (two) times daily.      Marland Kitchen warfarin (COUMADIN) 5 MG tablet Take 2.5-5 mg by  mouth daily. Take 1 tablet (5 mg) everyday except Monday. Take 1/2 tablet (2.5 mg) on Monday        HOSPITAL MEDICATIONS: I have reviewed the patient's current medications.  VITALS: Blood pressure 96/48, pulse 56, temperature 97.9 F (36.6 C), temperature source Axillary, resp. rate 25, height 5\' 6"  (1.676 m), weight 98.884 kg (218 lb), SpO2 97.00%.  PHYSICAL EXAM: General appearance: alert, cooperative and no distress Neck: no JVD, supple, symmetrical, trachea midline, thyroid not enlarged, symmetric, no tenderness/mass/nodules and soft RCA bruit, no swelling Lt neck Lungs: clear to auscultation bilaterally Heart: regular rythm, slow rate, no bruit Abdomen: soft, non-tender; bowel sounds normal; no masses,  no organomegaly Extremities: no edema, no hematoma RFA site, LFA bruit Pulses: decreased distal pulses Skin: cool and dry Neurologic: Grossly normal  LABS: Results  for orders placed during the hospital encounter of 02/12/12 (from the past 48 hour(s))  POCT I-STAT, CHEM 8     Status: Abnormal   Collection Time   02/12/12  7:33 AM      Component Value Range Comment   Sodium 142  135 - 145 mEq/L    Potassium 4.0  3.5 - 5.1 mEq/L    Chloride 106  96 - 112 mEq/L    BUN 12  6 - 23 mg/dL    Creatinine, Ser 0.80  0.50 - 1.10 mg/dL    Glucose, Bld 121 (*) 70 - 99 mg/dL    Calcium, Ion 1.26  1.13 - 1.30 mmol/L    TCO2 22  0 - 100 mmol/L    Hemoglobin 14.6  12.0 - 15.0 g/dL    HCT 43.0  36.0 - 46.0 %   APTT     Status: Normal   Collection Time   02/12/12  7:40 AM      Component Value Range Comment   aPTT 32  24 - 37 seconds   PROTIME-INR     Status: Normal   Collection Time   02/12/12  7:40 AM      Component Value Range Comment   Prothrombin Time 14.2  11.6 - 15.2 seconds    INR 1.08  0.00 - 1.49   POCT ACTIVATED CLOTTING TIME     Status: Normal   Collection Time   02/12/12 10:14 AM      Component Value Range Comment   Activated Clotting Time 399     GLUCOSE, CAPILLARY     Status: Abnormal   Collection Time   02/12/12 11:44 AM      Component Value Range Comment   Glucose-Capillary 105 (*) 70 - 99 mg/dL   CBC     Status: Abnormal   Collection Time   02/12/12  9:58 PM      Component Value Range Comment   WBC 5.6  4.0 - 10.5 K/uL    RBC 3.69 (*) 3.87 - 5.11 MIL/uL    Hemoglobin 11.5 (*) 12.0 - 15.0 g/dL DELTA CHECK NOTED   HCT 33.4 (*) 36.0 - 46.0 %    MCV 90.5  78.0 - 100.0 fL    MCH 31.2  26.0 - 34.0 pg    MCHC 34.4  30.0 - 36.0 g/dL    RDW 16.0 (*) 11.5 - 15.5 %    Platelets 204  150 - 400 K/uL   BASIC METABOLIC PANEL     Status: Abnormal   Collection Time   02/13/12  4:30 AM      Component Value Range Comment   Sodium 143  135 - 145 mEq/L    Potassium 3.5  3.5 - 5.1 mEq/L    Chloride 110  96 - 112 mEq/L    CO2 24  19 - 32 mEq/L    Glucose, Bld 172 (*) 70 - 99 mg/dL    BUN 11  6 - 23 mg/dL    Creatinine, Ser 0.72  0.50 - 1.10 mg/dL     Calcium 8.4  8.4 - 10.5 mg/dL    GFR calc non Af Amer >90  >90 mL/min    GFR calc Af Amer >90  >90 mL/min   CBC     Status: Abnormal   Collection Time   02/13/12  4:30 AM      Component Value Range Comment   WBC 7.7  4.0 - 10.5 K/uL    RBC 3.98  3.87 - 5.11 MIL/uL    Hemoglobin 12.1  12.0 - 15.0 g/dL    HCT 35.8 (*) 36.0 - 46.0 %    MCV 89.9  78.0 - 100.0 fL    MCH 30.4  26.0 - 34.0 pg    MCHC 33.8  30.0 - 36.0 g/dL    RDW 15.8 (*) 11.5 - 15.5 %    Platelets 215  150 - 400 K/uL   CARDIAC PANEL(CRET KIN+CKTOT+MB+TROPI)     Status: Normal   Collection Time   02/13/12  4:30 AM      Component Value Range Comment   Total CK 87  7 - 177 U/L    CK, MB 1.6  0.3 - 4.0 ng/mL    Troponin I <0.30  <0.30 ng/mL    Relative Index RELATIVE INDEX IS INVALID  0.0 - 2.5     IMAGING: No results found.  IMPRESSION:  Principal Problem:  *Bradycardia post carotid stenting  Active Problems:  Jaw pain in setting of bradycardia and hypotension- R/O MI  Hypotension post op, Dopamine started  PAF Jan 2013, DCCV 3/13, NSR on Multaq  PVD, S/P Lt carotid stenting 02/12/12. Prior  Rt RA stent in '04,  Rt popliteal PTA '05  CAD, CABG X 3 2004, distal LAD/OM disease 2005, Myoview low risk 7/12  Diabetes mellitus, Type 2 NIDDM  HTN (hypertension)  Dyslipidemia  Chronic anticoagulation, Coumadin stopped 02/05/12 for surgery   RECOMMENDATION:  EKG shows TWI V leads, there is a suggestion of this on her March 2013 EKG but she was significantly bradycardic then. Will check office EKGs. I held all cardiac Meds till reviewed by MD.  Troponin ordered. Her home dose of Metoprolol is 50mg  BID. (not 100mg  BID). She did not receive any Metoprolol last night. She was on Coumadin prior to admission, stopped on 02/05/12 in preparation for carotid endarterectomy.    Time Spent Directly with Patient: 45 minutes  Datrell Dunton K 02/13/2012, 11:17 AM

## 2012-02-13 NOTE — Progress Notes (Addendum)
[facility] Progress Note  02/13/2012 7:28 AM POD 1  Subjective:  States she had nausea last night, but this is improved.  Otherwise, no complaints.  Tm 99   HR [id number]   Systolic [id number] On 2.5 mcg of dopamine Filed Vitals:   02/13/12 0330  BP: 121/58  Pulse: 52  Temp: 99 F (37.2 C)  Resp: 25     Physical Exam: Neuro:  In tact Incision:  Right groin soft without hematoma  CBC    Component Value Date/Time   WBC 7.7 02/13/2012 0430   RBC 3.98 02/13/2012 0430   HGB 12.1 02/13/2012 0430   HCT 35.8* 02/13/2012 0430   PLT 215 02/13/2012 0430   MCV 89.9 02/13/2012 0430   MCH 30.4 02/13/2012 0430   MCHC 33.8 02/13/2012 0430   RDW 15.8* 02/13/2012 0430    BMET    Component Value Date/Time   NA 143 02/13/2012 0430   K 3.5 02/13/2012 0430   CL 110 02/13/2012 0430   CO2 24 02/13/2012 0430   GLUCOSE 172* 02/13/2012 0430   BUN 11 02/13/2012 0430   CREATININE 0.72 02/13/2012 0430   CALCIUM 8.4 02/13/2012 0430   GFRNONAA >90 02/13/2012 0430   GFRAA >90 02/13/2012 0430     Intake/Output Summary (Last 24 hours) at 02/13/12 0728 Last data filed at 02/13/12 0100  Gross per 24 hour  Intake   1525 ml  Output    900 ml  Net    625 ml      Assessment/Plan:  This is a 61 y.o. female who is s/p right carotid stent POD 1  -pt with bradycardia - will hold Multaq/lopressor this am -pt with hypotension overnight requiring 2 fluid boluses and is now on dopamine 2.5 mcg.  Will wean gtt as tolerated.  Hold all antihypertensives for now. -mobilize -otherwise, pt doing well. -Note given to pt for her son, Britta Mccreedy for being out of work yesterday.  Leontine Locket, PA-C [facility] [phone]

## 2012-02-13 NOTE — Consult Note (Signed)
Pt. Seen and examined. Agree with the NP/PA-C note as written.  61 yo female with significant cardiovascular disease, prior CABG x 3 vessels, PAD, a-fib with recent cardioversion in 08/2011 and history of renal and popliteal stenting. She was found to have significant L carotid stenosis and underwent stenting yesterday. This was complicated by post-operative hypotension and bradycardia (HR in 30's - 40's). There was reportedly intra-operative bradycardia as the patient was noted to receive 0.5 mg of atropine during the PCI. BP did not respond to fluid boluses and dopamine was started. First cardiac markers are negative. She is not describing any angina today. I suspect her symptoms are due to excessive baroreceptor reflex and parasympathetic stimulation in the setting of carotid stenting - which would explain her hypotension and bradycardia which improved somewhat with atropine. I agree with holding bp meds for now and weaning dopamine as blood pressure and HR increases.  Will follow with you. Thanks for the consult.  Pixie Casino, MD, [facility] Attending Cardiologist [facility]

## 2012-02-14 ENCOUNTER — Other Ambulatory Visit: Payer: Self-pay | Admitting: *Deleted

## 2012-02-14 ENCOUNTER — Encounter (HOSPITAL_COMMUNITY): Payer: Self-pay | Admitting: *Deleted

## 2012-02-14 DIAGNOSIS — I6529 Occlusion and stenosis of unspecified carotid artery: Secondary | ICD-10-CM

## 2012-02-14 DIAGNOSIS — Z48812 Encounter for surgical aftercare following surgery on the circulatory system: Secondary | ICD-10-CM

## 2012-02-14 MED ORDER — WARFARIN SODIUM 2.5 MG PO TABS: 2.5000 mg | ORAL_TABLET | Freq: Every day | ORAL | Status: DC

## 2012-02-14 MED ORDER — BISACODYL 10 MG RE SUPP
10.0000 mg | Freq: Every day | RECTAL | Status: DC | PRN
  Administered 2012-02-14: 10 mg via RECTAL
  Filled 2012-02-14: qty 1

## 2012-02-14 MED ORDER — METFORMIN HCL 500 MG PO TABS: 1000.0000 mg | ORAL_TABLET | Freq: Two times a day (BID) | ORAL | Status: DC

## 2012-02-14 MED ORDER — OXYCODONE HCL 5 MG PO TABS: 5.0000 mg | ORAL_TABLET | Freq: Four times a day (QID) | ORAL | Status: AC | PRN

## 2012-02-14 MED ORDER — METFORMIN HCL 500 MG PO TABS
1000.0000 mg | ORAL_TABLET | Freq: Two times a day (BID) | ORAL | Status: DC
  Administered 2012-02-14 – 2012-02-15 (×2): 1000 mg via ORAL
  Filled 2012-02-14 (×4): qty 2

## 2012-02-14 MED ORDER — CLOPIDOGREL BISULFATE 75 MG PO TABS: 75.0000 mg | ORAL_TABLET | Freq: Every day | ORAL | Status: DC

## 2012-02-14 MED ORDER — WARFARIN SODIUM 5 MG PO TABS
5.0000 mg | ORAL_TABLET | ORAL | Status: AC
  Administered 2012-02-14: 5 mg via ORAL
  Filled 2012-02-14: qty 1

## 2012-02-14 MED ORDER — WARFARIN SODIUM 2.5 MG PO TABS: 2.5000 mg | ORAL_TABLET | ORAL | Status: DC

## 2012-02-14 MED ORDER — WARFARIN - PHYSICIAN DOSING INPATIENT: Freq: Every day | Status: DC

## 2012-02-14 NOTE — Progress Notes (Signed)
[facility] Progress Note  02/14/2012 7:20 AM POD 2  Subjective:  Feels better this am.  Has walked in the hallway.  [id number] systolic  HR  99991111 reg  Afebrile   96%RA Dopamine off at 4pm yesterday and BP tolerating. Filed Vitals:   02/14/12 0400  BP: 108/49  Pulse: 45  Temp: 98.8 F (37.1 C)  Resp: 21     Physical Exam: Neuro:  In tact Incision:  Right groin soft without hematoma  CBC    Component Value Date/Time   WBC 7.7 02/13/2012 0430   RBC 3.98 02/13/2012 0430   HGB 12.1 02/13/2012 0430   HCT 35.8* 02/13/2012 0430   PLT 215 02/13/2012 0430   MCV 89.9 02/13/2012 0430   MCH 30.4 02/13/2012 0430   MCHC 33.8 02/13/2012 0430   RDW 15.8* 02/13/2012 0430    BMET    Component Value Date/Time   NA 143 02/13/2012 0430   K 3.5 02/13/2012 0430   CL 110 02/13/2012 0430   CO2 24 02/13/2012 0430   GLUCOSE 172* 02/13/2012 0430   BUN 11 02/13/2012 0430   CREATININE 0.72 02/13/2012 0430   CALCIUM 8.4 02/13/2012 0430   GFRNONAA >90 02/13/2012 0430   GFRAA >90 02/13/2012 0430     Intake/Output Summary (Last 24 hours) at 02/14/12 0720 Last data filed at 02/14/12 0600  Gross per 24 hour  Intake    900 ml  Output      0 ml  Net    900 ml      Assessment/Plan:  This is a 61 y.o. female who is s/p right carotid stent placement  POD 2  -doing well this am.  Neuro is in tact.  -BP stable and pt is still bradycardic.  Will hold BP meds and multaq at discharge and have pt follow up with cardiology in the near future.  -I have discontinued the Norvasc, lopressor, multaq and Diovan and continued her lasix and imdur for discharge.    -She will be discharged on 3 months of plavix along with her coumadin per Dr. Trula Slade.  -will have cardiology review medication before discharge and have them decide when she should follow up with them.  Will discharge pt when okay with cardiology.  Leontine Locket, PA-C [facility] [phone]

## 2012-02-14 NOTE — Progress Notes (Signed)
[facility]  Subjective: Doing well.  Objective: Vital signs in last 24 hours: Temp:  [98.2 F (36.8 C)-98.8 F (37.1 C)] 98.3 F (36.8 C) (08/15 0834) Pulse Rate:  [44-50] 45  (08/15 0400) Resp:  [17-21] 21  (08/15 0400) BP: (96-121)/(49-67) 108/49 mmHg (08/15 0400) SpO2:  [90 %-97 %] 96 % (08/15 0400) Last BM Date: 02/12/12  Intake/Output from previous day: 08/14 0701 - 08/15 0700 In: 975 [I.V.:975] Out: 0  Intake/Output this shift:    Medications Current Facility-Administered Medications  Medication Dose Route Frequency Provider Last Rate Last Dose  . 0.9 %  sodium chloride infusion   Intravenous Continuous Serafina Mitchell, MD 75 mL/hr at 02/14/12 0600    . acetaminophen (TYLENOL) tablet 325-650 mg  325-650 mg Oral Q4H PRN Serafina Mitchell, MD       Or  . acetaminophen (TYLENOL) suppository 325-650 mg  325-650 mg Rectal Q4H PRN Serafina Mitchell, MD      . alum & mag hydroxide-simeth (MAALOX/MYLANTA) 200-200-20 MG/5ML suspension 15-30 mL  15-30 mL Oral Q2H PRN Serafina Mitchell, MD      . aspirin EC tablet 81 mg  81 mg Oral Daily Serafina Mitchell, MD   81 mg at 02/13/12 1030  . atorvastatin (LIPITOR) tablet 10 mg  10 mg Oral q1800 Serafina Mitchell, MD   10 mg at 02/12/12 1556  . clopidogrel (PLAVIX) tablet 75 mg  75 mg Oral Q breakfast Serafina Mitchell, MD   75 mg at 02/14/12 0739  . DOPamine (INTROPIN) 800 mg in dextrose 5 % 250 mL infusion  5 mcg/kg/min Intravenous Titrated Angelia Mould, MD 4.6 mL/hr at 02/13/12 0701 2.5 mcg/kg/min at 02/13/12 0701  . guaiFENesin-dextromethorphan (ROBITUSSIN DM) 100-10 MG/5ML syrup 15 mL  15 mL Oral Q4H PRN Serafina Mitchell, MD      . labetalol (NORMODYNE,TRANDATE) injection 10 mg  10 mg Intravenous Q2H PRN Serafina Mitchell, MD       Or  . hydrALAZINE (APRESOLINE) injection 10 mg  10 mg Intravenous Q2H PRN Serafina Mitchell, MD      . metoprolol (LOPRESSOR) injection 2-5 mg  2-5 mg Intravenous Q2H PRN Serafina Mitchell, MD      . morphine 2 MG/ML injection 2-5 mg  2-5 mg Intravenous Q1H PRN Serafina Mitchell, MD      . ondansetron [facility]) injection 4 mg  4 mg Intravenous Q6H PRN Serafina Mitchell, MD   4 mg at 02/12/12 2254  . oxyCODONE-acetaminophen (PERCOCET/ROXICET) 5-325 MG per tablet 1-2 tablet  1-2 tablet Oral Q4H PRN Serafina Mitchell, MD   1 tablet at 02/14/12 0734  . pantoprazole (PROTONIX) EC tablet 20 mg  20 mg Oral Q1200 Serafina Mitchell, MD   20 mg at 02/13/12 1200  . phenol (CHLORASEPTIC) mouth spray 1 spray  1 spray Mouth/Throat PRN Serafina Mitchell, MD      . potassium chloride SA (K-DUR,KLOR-CON) CR tablet 20 mEq  20 mEq Oral Daily Serafina Mitchell, MD   20 mEq at 02/13/12 1030  . DISCONTD: amLODipine (NORVASC) tablet 10 mg  10 mg Oral Daily Serafina Mitchell, MD      . DISCONTD: dronedarone (MULTAQ) tablet 400 mg  400 mg Oral BID WC Serafina Mitchell, MD   400 mg at 02/12/12 1557  . DISCONTD: furosemide (LASIX) tablet 40 mg  40 mg Oral Daily Serafina Mitchell, MD      .  DISCONTD: irbesartan (AVAPRO) tablet 75 mg  75 mg Oral Daily Serafina Mitchell, MD      . DISCONTD: isosorbide mononitrate (IMDUR) 24 hr tablet 30 mg  30 mg Oral Daily Serafina Mitchell, MD      . DISCONTD: metoprolol (LOPRESSOR) tablet 100 mg  100 mg Oral BID Serafina Mitchell, MD        PE: General appearance: alert, cooperative and no distress Lungs: clear to auscultation bilaterally Heart: HR slow and regular.  1/6 systolic MM. Extremities: Trace LEE Pulses: 2+ and symmetric Skin: Warm and dry Neurologic: Grossly normal  Lab Results:   Basename 02/13/12 0430 02/12/12 2158 02/12/12 0733  WBC 7.7 5.6 --  HGB 12.1 11.5* 14.6  HCT 35.8* 33.4* 43.0  PLT 215 204 --   BMET  Basename 02/13/12 0430 02/12/12 0733  NA 143 142  K 3.5 4.0  CL 110 106  CO2 24 --  GLUCOSE 172* 121*  BUN 11 12  CREATININE 0.72 0.80  CALCIUM 8.4 --   PT/INR  Basename 02/12/12 0740  LABPROT 14.2  INR 1.08   Cardiac Panel (last 3  results)  Basename 02/13/12 1304 02/13/12 0430  CKTOTAL 95 87  CKMB 1.7 1.6  TROPONINI <0.30 <0.30  RELINDX RELATIVE INDEX IS INVALID RELATIVE INDEX IS INVALID    Assessment/Plan  Principal Problem:  *Bradycardia Active Problems:  PAF (paroxysmal atrial fibrillation)  Hypotension  PVD (peripheral vascular disease)  CAD, CABG X 3 2004, distal LAD/OM disease 2005, Myoview low risk 7/12  Diabetes mellitus, Type 2 NIDDM  HTN (hypertension)  Dyslipidemia  Chronic anticoagulation  Jaw pain  Plan:  Cardiac markers are negative.  HR was 45 when I entered the room and increased to 50.  Dopamine recently stopped.  Continue to monitor and no beta blockers.  May be able to DC tomorrow if HR improves.   LOS: 2 days    HAGER, BRYAN 02/14/2012 9:02 AM  I have seen and examined the patient along with Gaspar Bidding hager, PA.  I have reviewed the chart, notes and new data.  I agree with PA's note.  She feels great. Took a shower. She is not dizzy or weak. Hypotension has resolved. Moderate bradycardia persists: mostly in low 40s this AM, but HR right now is 50-52. Long standing tendency to bradycardia, potentiated by treatment with beta blockers and Multaq, and acutely worsened by carotid sinus stimulation.  PLAN: Right now, would treat conservatively and keep off metoprolol and Multaq. Eventually will need dual chamber pacemaker, especially if AF returns and we need to restart dronedarone or other antiarrhythmic. She is tired of medical procedures and does not want to rush into pacemaker implantation now. Delaying the PPM is not unreasonable as long as she is asymptomatic.  Sanda Klein, MD, [facility] [phone] 02/14/2012, 10:56 AM

## 2012-02-14 NOTE — Progress Notes (Signed)
Agree with above Much better today D/c plavix in 3 months Continue ASA and coumadin [place]

## 2012-02-14 NOTE — Progress Notes (Signed)
Agree with above  Will try to wean Dopa.  Will need to stay an additional day due to decreased HR and BP Remains neurologically intact  [facility] Brabham

## 2012-02-14 NOTE — Progress Notes (Signed)
Have notified Samatha Rhyme, PA that cardiology has seen the patient and they are wanting her to stay one more day, so she will stay. Also requested a laxative for the patient.

## 2012-02-15 LAB — PROTIME-INR
INR: 1.11 (ref 0.00–1.49)
Prothrombin Time: 14.5 seconds (ref 11.6–15.2)

## 2012-02-15 NOTE — Discharge Summary (Signed)
[facility] Discharge Summary   Patient ID:  [name] MRN: [id number] DOB/AGE: 10-30-1950 61 y.o.  Admit date: 02/12/2012 Discharge date: 02/15/2012 Date of Surgery: 02/12/2012 Surgeon: Surgeon(s): Serafina Mitchell, MD [name], MD [name], MD  Admission Diagnosis: Carotid stenosis  Discharge Diagnoses:  Carotid stenosis  Secondary Diagnoses: Past Medical History  Diagnosis Date  . Irregular heart beat   . CAD (coronary artery disease)   . Myocardial infarction 2002  . Peripheral vascular disease   . 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 She has been followed with serial ultrasound and recently had a progression of the stenosis on the left side. She is now greater than 80% on the left. She continues to be asymptomatic. Specifically she denies numbness or weakness in either extremity. She denies slurred speech. She denies amaurosis fugax.  Patient has an extensive vascular history. She underwent coronary artery bypass grafting in 2003. She has also undergone popliteal angioplasty in 2005 on the right. 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 She is medically managed for her hypercholesterolemia. She worked for a thyroid nodule. She was started on Coumadin earlier this year for atrial fibrillation. It was felt that she has a very high lesion which may not be accessible surgically. It therefore was recommended proceeding with left carotid stent with distal embolic protection   Hospital Course:  [name] is a 61 y.o. female is S/P Left Procedure(s): CAROTID STENT INSERTION CAROTID ANGIOGRAM Extubated: POD # 0 Post-op wounds healing well Pt. Ambulating, voiding and taking PO diet without  difficulty. Pt pain controlled with PO pain meds. Neurologically intact throughout hosp course Labs as below Complications:Sick sinus syndrome and hypotension - managed by Cardiology - pt to have pacemaker at future date Beta blockers held as well as all BP meds Pt to take BP BID at home and notify cardiologist if SBP becomes high Pt to have F/U with Card in 1 week  Consults:  Treatment Team:  Pixie Casino, MD  Significant Diagnostic Studies: CBC Lab Results  Component Value Date   WBC 7.7 02/13/2012   HGB 12.1 02/13/2012   HCT 35.8* 02/13/2012   MCV 89.9 02/13/2012   PLT 215 02/13/2012    BMET    Component Value Date/Time   NA 143 02/13/2012 0430   K 3.5 02/13/2012 0430   CL 110 02/13/2012 0430   CO2 24 02/13/2012 0430   GLUCOSE 172* 02/13/2012 0430   BUN 11 02/13/2012 0430   CREATININE 0.72 02/13/2012 0430   CALCIUM 8.4 02/13/2012 0430   GFRNONAA >90 02/13/2012 0430   GFRAA >90 02/13/2012 0430   COAG Lab Results  Component Value Date   INR 1.11 02/15/2012   INR 1.08 02/12/2012   INR 2.11* 09/11/2011     Disposition:  Discharge to :Home Discharge Orders    Future Appointments: Provider: Department: Dept Phone: Center:   03/17/2012 10:00 AM [facility] [id number] [facility]   03/17/2012 11:15 AM Serafina Mitchell, MD [facility] [phone] [facility]     Future Orders Please Complete By Expires   Resume previous diet      Driving Restrictions      Comments:   No driving for 2 weeks   Lifting restrictions  Comments:   No lifting for 6 weeks   Call MD for:  temperature >100.5      Call MD for:  redness, tenderness, or signs of infection (pain, swelling, bleeding, redness, odor or green/yellow discharge around incision site)      Call MD for:  severe or increased pain, loss or decreased feeling  in affected limb(s)      CAROTID Sugery: Call MD for difficulty swallowing or speaking; weakness in arms or legs that is a new symtom; severe headache.  If you have  increased swelling in the neck and/or  are having difficulty breathing, CALL 911      Discharge wound care:      Comments:   Shower daily with soap and water starting 02/14/12      Elaine, [name]  Home Medication Instructions [id number]   Printed on:02/15/12 1325  Medication Information                    rosuvastatin (CRESTOR) 40 MG tablet Take 40 mg by mouth daily.           metFORMIN (GLUCOPHAGE) 1000 MG tablet Take 1,000 mg by mouth 2 (two) times daily with a meal.           warfarin (COUMADIN) 5 MG tablet Take 2.5-5 mg by mouth daily. Take 1 tablet (5 mg) everyday except Monday. Take 1/2 tablet (2.5 mg) on Monday           aspirin 81 MG tablet Take 81 mg by mouth daily.           lansoprazole (PREVACID) 15 MG capsule Take 15 mg by mouth daily.           clopidogrel (PLAVIX) 75 MG tablet Take 1 tablet (75 mg total) by mouth daily with breakfast.           oxyCODONE (ROXICODONE) 5 MG immediate release tablet Take 1 tablet (5 mg total) by mouth every 6 (six) hours as needed for pain.            Verbal and written Discharge instructions given to the patient. Wound care per Discharge AVS Follow-up Information    Follow up with Trula Slade IV, [place], MD in 4 weeks. (Office will call you to arrange your appt (sent))    Contact information:   [place] [phone]          Signed: Richrd Prime 02/15/2012, 1:25 PM

## 2012-02-15 NOTE — Progress Notes (Addendum)
Pt. Seen and examined. Agree with the NP/PA-C note as written.  She appears to have sick sinus syndrome. A-fib has been well controlled on Multaq, however, HR does not support b-blocker or Multaq at this time. I think her carotid manipulation had an effect on heartrate, but I would have expected complete recovery at this point. Therefore, I agree a pacemaker is a reasonable approach to allow control of atrial fibrillation. I discussed this with her today and she is agreeable. There have been no sinus pauses or apparent life-threatening arrythmias noted. Not clear what caused her N/V today, but it was not related to HR.  The main complicating issue is that she is on Plavix as well as coumadin. Coumadin would not have to be stopped for the pacemaker, however, Plavix would. Given her recent carotid stent, we will likely have to wait - I will discuss with Dr. Gwenlyn Found about that time period.   Ok to discharge home today from our perspective - continue to hold b-blocker and Multaq.  Elaine Casino, MD, [facility] Attending Cardiologist [facility] [place]

## 2012-02-15 NOTE — Discharge Summary (Signed)
Agree with above Neurologically intact To follow up with cardiology.  Appreciate their assistance Would not stop plavix for at least 6 weeks  [name]

## 2012-02-15 NOTE — Progress Notes (Signed)
Patient has been discharged home via wheelchair. Both IV's D/C, reviewed all discharge instructions with patient and her husband, no further questions or concerns voiced.

## 2012-02-15 NOTE — Progress Notes (Signed)
[facility]  Subjective: Ambulating without difficulty.  Did become nauseated and vomited ~ 26mins later.  Objective: Vital signs in last 24 hours: Temp:  [97.6 F (36.4 C)-98.2 F (36.8 C)] 98.1 F (36.7 C) (08/16 0800) Pulse Rate:  [48-52] 48  (08/15 2345) Resp:  [17-20] 17  (08/15 2345) BP: (107-132)/(45-56) 107/45 mmHg (08/15 2345) SpO2:  [97 %] 97 % (08/15 2345) Last BM Date: 02/14/12  Intake/Output from previous day: 08/15 0701 - 08/16 0700 In: 480 [P.O.:480] Out: -  Intake/Output this shift:    Medications Current Facility-Administered Medications  Medication Dose Route Frequency Provider Last Rate Last Dose  . 0.9 %  sodium chloride infusion   Intravenous Continuous Serafina Mitchell, MD      . acetaminophen (TYLENOL) tablet 325-650 mg  325-650 mg Oral Q4H PRN Serafina Mitchell, MD       Or  . acetaminophen (TYLENOL) suppository 325-650 mg  325-650 mg Rectal Q4H PRN Serafina Mitchell, MD      . alum & mag hydroxide-simeth (MAALOX/MYLANTA) 200-200-20 MG/5ML suspension 15-30 mL  15-30 mL Oral Q2H PRN Serafina Mitchell, MD      . aspirin EC tablet 81 mg  81 mg Oral Daily Serafina Mitchell, MD   81 mg at 02/14/12 1018  . atorvastatin (LIPITOR) tablet 10 mg  10 mg Oral q1800 Serafina Mitchell, MD   10 mg at 02/14/12 1738  . bisacodyl (DULCOLAX) suppository 10 mg  10 mg Rectal Daily PRN Serafina Mitchell, MD   10 mg at 02/14/12 1018  . clopidogrel (PLAVIX) tablet 75 mg  75 mg Oral Q breakfast Serafina Mitchell, MD   75 mg at 02/15/12 0739  . DOPamine (INTROPIN) 800 mg in dextrose 5 % 250 mL infusion  5 mcg/kg/min Intravenous Titrated Angelia Mould, MD 4.6 mL/hr at 02/13/12 0701 2.5 mcg/kg/min at 02/13/12 0701  . guaiFENesin-dextromethorphan (ROBITUSSIN DM) 100-10 MG/5ML syrup 15 mL  15 mL Oral Q4H PRN Serafina Mitchell, MD      . labetalol (NORMODYNE,TRANDATE) injection 10 mg  10 mg Intravenous Q2H PRN Serafina Mitchell, MD       Or  . hydrALAZINE  (APRESOLINE) injection 10 mg  10 mg Intravenous Q2H PRN Serafina Mitchell, MD      . metFORMIN (GLUCOPHAGE) tablet 1,000 mg  1,000 mg Oral BID WC Serafina Mitchell, MD   1,000 mg at 02/15/12 0739  . metoprolol (LOPRESSOR) injection 2-5 mg  2-5 mg Intravenous Q2H PRN Serafina Mitchell, MD      . morphine 2 MG/ML injection 2-5 mg  2-5 mg Intravenous Q1H PRN Serafina Mitchell, MD      . ondansetron [facility]) injection 4 mg  4 mg Intravenous Q6H PRN Serafina Mitchell, MD   4 mg at 02/12/12 2254  . oxyCODONE-acetaminophen (PERCOCET/ROXICET) 5-325 MG per tablet 1-2 tablet  1-2 tablet Oral Q4H PRN Serafina Mitchell, MD   2 tablet at 02/15/12 0739  . pantoprazole (PROTONIX) EC tablet 20 mg  20 mg Oral Q1200 Serafina Mitchell, MD   20 mg at 02/14/12 1156  . phenol (CHLORASEPTIC) mouth spray 1 spray  1 spray Mouth/Throat PRN Serafina Mitchell, MD      . potassium chloride SA (K-DUR,KLOR-CON) CR tablet 20 mEq  20 mEq Oral Daily Serafina Mitchell, MD   20 mEq at 02/14/12 1018  . warfarin (COUMADIN) tablet 2.5 mg  2.5 mg Oral Custom  Serafina Mitchell, MD      . warfarin (COUMADIN) tablet 5 mg  5 mg Oral Custom Serafina Mitchell, MD   5 mg at 02/14/12 1738  . Warfarin - Physician Dosing Inpatient   Does not apply [id number] Serafina Mitchell, MD      . DISCONTD: metFORMIN (GLUCOPHAGE) tablet 1,000 mg  1,000 mg Oral BID WC Gabriel Earing, PA      . DISCONTD: warfarin (COUMADIN) tablet 2.5-5 mg  2.5-5 mg Oral Daily Gabriel Earing, PA        PE: General appearance: alert, cooperative and no distress  Lungs: clear to auscultation bilaterally  Heart: HR slow and regular. 1/6 systolic MM.  Extremities: Trace LEE  Pulses: 2+ right radial 1/2-1+left radial  Skin: Warm and dry  Neurologic: Grossly normal    Lab Results:   Basename 02/13/12 0430 02/12/12 2158  WBC 7.7 5.6  HGB 12.1 11.5*  HCT 35.8* 33.4*  PLT 215 204   BMET  Basename 02/13/12 0430  NA 143  K 3.5  CL 110  CO2 24  GLUCOSE 172*  BUN 11  CREATININE 0.72    CALCIUM 8.4   PT/INR  Basename 02/15/12 0441  LABPROT 14.5  INR 1.11    Assessment/Plan  Principal Problem:  *Bradycardia Active Problems:  PAF (paroxysmal atrial fibrillation)  Hypotension  PVD (peripheral vascular disease)  CAD, CABG X 3 2004, distal LAD/OM disease 2005, Myoview low risk 7/12  Diabetes mellitus, Type 2 NIDDM  HTN (hypertension)  Dyslipidemia  Chronic anticoagulation  Jaw pain  Plan:  Sinus brady in the forties.  HR went in to the 60-70's when ambulating.  No dopamine since Wednesday at 1600hrs.  Episode of nausea and vomiting approx 30 minutes after ambulation.  DC lopressor and multaq.    LOS: 3 days    [name] 02/15/2012 9:56 AM

## 2012-02-15 NOTE — Progress Notes (Addendum)
Patient has just ambulated the entire unit at which time this writer monitored her heart rate. The heart rate maintained between 65-71. Patient experienced no pain, respirations did however increase into the 30-40's. Back in the chair the heart rate has gone back into the 50's. Now at 9:30 patient experienced some waves of "hot sweats" became nauseated and vomited small amount of her breakfast, now feels much better. Denies any chest pain or discomfort.

## 2012-02-20 NOTE — Consult Note (Signed)
Elaine, [name]               ACCOUNT NO.:  [id number]  MEDICAL RECORD NO.:  [id number]  LOCATION:                                 FACILITY:  PHYSICIAN:  Denham Mose K. Kieran Nachtigal, M.D.DATE OF BIRTH:  01-Oct-1950  DATE OF CONSULTATION: DATE OF DISCHARGE:  02/15/2012                                CONSULTATION   CLINICAL EXAMINATION:  Intracranial interpretation of bilateral common carotid arteriograms.  The right common carotid arteriogram demonstrates the distal right internal carotid artery in the cervical segment to be normal.  The petrous and cavernous junction demonstrates a saccular aneurysm projecting anteriorly.  This measures approximately 4.5 mm x 3.5 mm.  In addition to this, the right internal carotid artery opacifies normally to the cavernous segment.  The supraclinoid segment demonstrates mild narrowing.  The right middle cerebral artery in the M1 segment demonstrates approximately 50% stenosis.  Mild caliber irregularities also noted extending into the trifurcation branches bilaterally.  There was no filling of the right anterior cerebral artery A1 segment.  The left common carotid arteriogram demonstrates the left internal carotid artery at the distal cervical segment to be normal.  The petrous, cavernous and supraclinoid segments were also normally opacified.  The left middle cerebral artery M1 segment demonstrates approximately 50% stenosis.  Opacification of the trifurcation branches is normal.  The left anterior cerebral artery proximally and distally into the capillary and venous phases is normal.  Cross opacification via the anterior communicating artery of the right anterior cerebral artery A2 segment is also seen.  The poststent placement of the left common carotid artery/left internal carotid artery bifurcation arteriogram demonstrates no obvious filling defects in the intracranial circulation involving the middle and the anterior cerebral  artery distributions.  However, there is improved hemodynamic flow through the anterior circulation, poststent placement.  IMPRESSION: 1. 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 2. Approximately 50% stenosis of the middle cerebral arteries     bilaterally in the proximal segment. 3. 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 4. Improved dynamic flow through the left anterior circulation     following placement of a stent in the proximal left internal     carotid artery.          ______________________________ Fritz Pickerel Estanislado Pandy, M.D.     SKD/MEDQ  D:  02/19/2012  T:  02/20/2012  Job:  [id number]

## 2012-03-14 ENCOUNTER — Encounter: Payer: Self-pay | Admitting: Surgery

## 2012-03-17 ENCOUNTER — Other Ambulatory Visit (INDEPENDENT_AMBULATORY_CARE_PROVIDER_SITE_OTHER): Payer: Medicare Other | Admitting: *Deleted

## 2012-03-17 ENCOUNTER — Ambulatory Visit (INDEPENDENT_AMBULATORY_CARE_PROVIDER_SITE_OTHER): Payer: Medicare Other | Admitting: Surgery

## 2012-03-17 ENCOUNTER — Encounter: Payer: Self-pay | Admitting: Surgery

## 2012-03-17 VITALS — BP 166/87 | HR 68 | Resp 16 | Ht 67.0 in | Wt 192.0 lb

## 2012-03-17 DIAGNOSIS — I6529 Occlusion and stenosis of unspecified carotid artery: Secondary | ICD-10-CM

## 2012-03-17 DIAGNOSIS — Z48812 Encounter for surgical aftercare following surgery on the circulatory system: Secondary | ICD-10-CM

## 2012-03-17 NOTE — Addendum Note (Signed)
Addended by: Mena Goes on: 03/17/2012 12:49 PM   Modules accepted: Orders

## 2012-03-17 NOTE — Progress Notes (Signed)
[facility]   Patient name: [name] MRN: [id number] DOB: February 28, 1951 Sex: female     Chief Complaint  Patient presents with  . Carotid    one month f/up Right  carotid Angiogram with vascular Lab study today.    HISTORY OF PRESENT ILLNESS: The patient is back today for followup. She is status post left carotid stent with distal embolic protection on [id number]. Her postoperative course was complicated by irregular heartbeat and hypertension which ultimately resulted. She is back today for followup without complaints. Specifically she denies any neurologic symptoms including amaurosis, numbness or weakness, and slurred speech.  Past Medical History  Diagnosis Date  . Irregular heart beat   . CAD (coronary artery disease)   . Myocardial infarction 2002  . Peripheral vascular disease   . 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 Cardioversion 09/11/2011    Procedure: CARDIOVERSION;  Surgeon: Sanda Klein, MD;  Location: [facility] OR;  Service: Cardiovascular;  Laterality: N/A;  . Coronary artery bypass graft 2003  . Carotid stent 02/12/12    Carotid Stent Insertion, Carotid Angiogram    History   Social History  . Marital Status: Married    Spouse Name: N/A    Number of Children: N/A  . Years of Education: N/A   Occupational History  . Not on file.   Social History Main Topics  . Smoking status: Former Smoker    Quit date: 07/02/2001  . Smokeless tobacco: Not on file  . Alcohol Use: Yes  . Drug Use: No  . Sexually Active: Not on file   Other Topics Concern  . Not on file   Social History Narrative  . No narrative on file    Family History  Problem Relation Age of Onset  . Deep vein thrombosis Sister   . Diabetes Sister   . Heart disease Sister   . Hyperlipidemia Sister   . Hypertension Sister   . Diabetes Brother   . Hypertension Son     Allergies as of 03/17/2012  . (No Known Allergies)     Current Outpatient Prescriptions on File Prior to Visit  Medication Sig Dispense Refill  . aspirin 81 MG tablet Take 81 mg by mouth daily.      . clopidogrel (PLAVIX) 75 MG tablet Take 1 tablet (75 mg total) by mouth daily with breakfast.  30 tablet  3  . lansoprazole (PREVACID) 15 MG capsule Take 15 mg by mouth daily.      . metFORMIN (GLUCOPHAGE) 1000 MG tablet Take 1,000 mg by mouth 2 (two) times daily with a meal.      . rosuvastatin (CRESTOR) 40 MG tablet Take 40 mg by mouth daily.      Marland Kitchen warfarin (COUMADIN) 5 MG tablet Take 2.5-5 mg by mouth daily. Take 1 tablet (5 mg) everyday except Monday. Take 1/2 tablet (2.5 mg) on Monday         REVIEW OF SYSTEMS: No change from prior visit  PHYSICAL EXAMINATION:   Vital signs are BP 166/87  Pulse 68  Resp 16  Ht 5\' 7"  (1.702 m)  Wt 192 lb (87.091 kg)  BMI 30.07 kg/m2  SpO2 100% General: The patient appears their stated age. HEENT:  No gross abnormalities Pulmonary:  Non labored breathing Musculoskeletal: There are no major deformities. Neurologic: No focal weakness or paresthesias are detected, Skin: There are no ulcer or rashes noted. Psychiatric: The patient has  normal affect. Cardiovascular: There is a regular rate and rhythm without significant murmur appreciated. No carotid bruits   Diagnostic Studies I have ordered and reviewed her ultrasound. This shows a widely patent left internal carotid stent without evidence of stenosis. There is a increase in the stenosis in her right carotid artery, it now measures in the 60-79% category.  Assessment: Status post left carotid stent Plan: The patient will need serial ultrasound followup of her carotid disease. Her next study will need to be in 6 months. I have asked her to discuss this with Dr.Weintraub at her next visit to see whether or not he would like to follow this or whether it should be done here. If he is going to be done at [facility], I would like to schedule my visit  2 weeks after she gets her study so that I can make sure I get a faxed copy of the study.  Eldridge Abrahams, M.D. [facility] [place] Office: [phone] Pager:  [phone]

## 2012-07-02 HISTORY — PX: TRANSTHORACIC ECHOCARDIOGRAM: SHX275

## 2012-07-04 ENCOUNTER — Other Ambulatory Visit (HOSPITAL_COMMUNITY): Payer: Self-pay | Admitting: Cardiovascular Disease

## 2012-07-04 DIAGNOSIS — I10 Essential (primary) hypertension: Secondary | ICD-10-CM

## 2012-07-04 DIAGNOSIS — R0602 Shortness of breath: Secondary | ICD-10-CM

## 2012-07-16 ENCOUNTER — Ambulatory Visit (HOSPITAL_COMMUNITY)
Admission: RE | Admit: 2012-07-16 | Discharge: 2012-07-16 | Disposition: A | Discharge status: Home / Self Care | Payer: Medicare PPO | Source: Ambulatory Visit | Attending: Cardiovascular Disease | Admitting: Cardiovascular Disease

## 2012-07-16 DIAGNOSIS — I10 Essential (primary) hypertension: Secondary | ICD-10-CM | POA: Insufficient documentation

## 2012-07-16 DIAGNOSIS — R0602 Shortness of breath: Secondary | ICD-10-CM | POA: Insufficient documentation

## 2012-07-16 NOTE — Progress Notes (Signed)
[facility]   2D echo completed 07/16/2012.   Jamison Neighbor, RDCS

## 2012-07-18 ENCOUNTER — Other Ambulatory Visit (HOSPITAL_COMMUNITY): Payer: Self-pay | Admitting: Cardiovascular Disease

## 2012-07-18 DIAGNOSIS — I672 Cerebral atherosclerosis: Secondary | ICD-10-CM

## 2012-08-11 ENCOUNTER — Ambulatory Visit (HOSPITAL_COMMUNITY)
Admission: RE | Admit: 2012-08-11 | Discharge: 2012-08-11 | Disposition: A | Discharge status: Home / Self Care | Payer: Medicare PPO | Source: Ambulatory Visit | Attending: Cardiovascular Disease | Admitting: Cardiovascular Disease

## 2012-08-11 DIAGNOSIS — I6529 Occlusion and stenosis of unspecified carotid artery: Secondary | ICD-10-CM | POA: Insufficient documentation

## 2012-08-11 DIAGNOSIS — I672 Cerebral atherosclerosis: Secondary | ICD-10-CM | POA: Insufficient documentation

## 2012-08-11 NOTE — Progress Notes (Signed)
Carotid Duplex Completed. Journey Ratterman D  

## 2012-09-15 ENCOUNTER — Other Ambulatory Visit: Payer: Medicare Other

## 2012-09-15 ENCOUNTER — Ambulatory Visit: Payer: Medicare Other | Admitting: Surgery

## 2012-09-17 ENCOUNTER — Other Ambulatory Visit: Payer: Self-pay | Admitting: *Deleted

## 2012-09-17 MED ORDER — CLOPIDOGREL BISULFATE 75 MG PO TABS: 75.0000 mg | ORAL_TABLET | Freq: Every day | ORAL | Status: AC

## 2012-09-30 HISTORY — PX: OTHER SURGICAL HISTORY: SHX169

## 2012-10-07 ENCOUNTER — Other Ambulatory Visit (HOSPITAL_COMMUNITY): Payer: Self-pay | Admitting: Cardiovascular Disease

## 2012-10-07 DIAGNOSIS — I129 Hypertensive chronic kidney disease with stage 1 through stage 4 chronic kidney disease, or unspecified chronic kidney disease: Secondary | ICD-10-CM

## 2012-10-07 DIAGNOSIS — I70219 Atherosclerosis of native arteries of extremities with intermittent claudication, unspecified extremity: Secondary | ICD-10-CM

## 2012-10-15 ENCOUNTER — Ambulatory Visit (HOSPITAL_COMMUNITY)
Admission: RE | Admit: 2012-10-15 | Discharge: 2012-10-15 | Disposition: A | Discharge status: Home / Self Care | Payer: Medicare PPO | Source: Ambulatory Visit | Attending: Cardiovascular Disease | Admitting: Cardiovascular Disease

## 2012-10-15 DIAGNOSIS — N189 Chronic kidney disease, unspecified: Secondary | ICD-10-CM | POA: Insufficient documentation

## 2012-10-15 DIAGNOSIS — I70219 Atherosclerosis of native arteries of extremities with intermittent claudication, unspecified extremity: Secondary | ICD-10-CM | POA: Insufficient documentation

## 2012-10-15 DIAGNOSIS — I129 Hypertensive chronic kidney disease with stage 1 through stage 4 chronic kidney disease, or unspecified chronic kidney disease: Secondary | ICD-10-CM | POA: Insufficient documentation

## 2012-10-15 NOTE — Progress Notes (Signed)
Arterial Duplex Lower Ext. Completed. [name] D  

## 2012-10-15 NOTE — Progress Notes (Signed)
Renal Duplex Completed. [name] D  

## 2013-02-20 ENCOUNTER — Other Ambulatory Visit (HOSPITAL_COMMUNITY): Payer: Self-pay | Admitting: Cardiovascular Disease

## 2013-03-02 HISTORY — PX: OTHER SURGICAL HISTORY: SHX169

## 2013-03-10 ENCOUNTER — Ambulatory Visit (HOSPITAL_COMMUNITY)
Admission: RE | Admit: 2013-03-10 | Discharge: 2013-03-10 | Disposition: A | Discharge status: Home / Self Care | Payer: Medicare PPO | Source: Ambulatory Visit | Attending: Cardiovascular Disease | Admitting: Cardiovascular Disease

## 2013-03-10 DIAGNOSIS — I6529 Occlusion and stenosis of unspecified carotid artery: Secondary | ICD-10-CM | POA: Insufficient documentation

## 2013-03-10 NOTE — Progress Notes (Signed)
Carotid Duplex Completed. [name], BS, RDMS, RVT  

## 2013-03-31 ENCOUNTER — Other Ambulatory Visit: Payer: Self-pay | Admitting: Cardiovascular Disease

## 2013-03-31 ENCOUNTER — Encounter: Payer: Self-pay | Admitting: Cardiovascular Disease

## 2013-03-31 LAB — CBC WITH DIFFERENTIAL/PLATELET
Basophils Relative: 1 % (ref 0–1)
Eosinophils Absolute: 0.1 10*3/uL (ref 0.0–0.7)
HCT: 39.1 % (ref 36.0–46.0)
Hemoglobin: 13.5 g/dL (ref 12.0–15.0)
Lymphocytes Relative: 46 % (ref 12–46)
Lymphs Abs: 2.7 10*3/uL (ref 0.7–4.0)
MCH: 31.2 pg (ref 26.0–34.0)
MCHC: 34.5 g/dL (ref 30.0–36.0)
MCV: 90.3 fL (ref 78.0–100.0)
Monocytes Absolute: 0.5 10*3/uL (ref 0.1–1.0)
Monocytes Relative: 8 % (ref 3–12)
Neutro Abs: 2.6 10*3/uL (ref 1.7–7.7)
RBC: 4.33 MIL/uL (ref 3.87–5.11)
WBC: 6 10*3/uL (ref 4.0–10.5)

## 2013-03-31 LAB — COMPREHENSIVE METABOLIC PANEL
Albumin: 4.2 g/dL (ref 3.5–5.2)
Alkaline Phosphatase: 61 U/L (ref 39–117)
BUN: 25 mg/dL — ABNORMAL HIGH (ref 6–23)
CO2: 32 mEq/L (ref 19–32)
Calcium: 10 mg/dL (ref 8.4–10.5)
Glucose, Bld: 137 mg/dL — ABNORMAL HIGH (ref 70–99)
Potassium: 4 mEq/L (ref 3.5–5.3)
Total Bilirubin: 0.5 mg/dL (ref 0.3–1.2)
Total Protein: 7.3 g/dL (ref 6.0–8.3)

## 2013-03-31 LAB — LIPID PANEL
Cholesterol: 116 mg/dL (ref 0–200)
HDL: 41 mg/dL (ref >39)
LDL Cholesterol: 52 mg/dL (ref 0–99)
Triglycerides: 116 mg/dL (ref <150)
VLDL: 23 mg/dL (ref 0–40)

## 2013-04-23 ENCOUNTER — Telehealth (HOSPITAL_COMMUNITY): Payer: Self-pay | Admitting: *Deleted

## 2013-05-19 ENCOUNTER — Other Ambulatory Visit (HOSPITAL_COMMUNITY): Payer: Self-pay | Admitting: Internal Medicine

## 2013-05-19 DIAGNOSIS — E0590 Thyrotoxicosis, unspecified without thyrotoxic crisis or storm: Secondary | ICD-10-CM

## 2013-05-21 ENCOUNTER — Encounter: Payer: Self-pay | Admitting: Cardiovascular Disease

## 2013-06-09 ENCOUNTER — Encounter (HOSPITAL_COMMUNITY): Payer: Medicare PPO

## 2013-06-10 ENCOUNTER — Encounter (HOSPITAL_COMMUNITY): Payer: Medicare PPO

## 2013-09-11 ENCOUNTER — Other Ambulatory Visit (HOSPITAL_COMMUNITY): Payer: Self-pay | Admitting: Internal Medicine

## 2013-09-11 DIAGNOSIS — E0590 Thyrotoxicosis, unspecified without thyrotoxic crisis or storm: Secondary | ICD-10-CM

## 2013-09-11 DIAGNOSIS — E041 Nontoxic single thyroid nodule: Secondary | ICD-10-CM

## 2013-09-11 DIAGNOSIS — E049 Nontoxic goiter, unspecified: Secondary | ICD-10-CM

## 2013-09-21 ENCOUNTER — Encounter (HOSPITAL_COMMUNITY)
Admission: RE | Admit: 2013-09-21 | Discharge: 2013-09-21 | Disposition: A | Discharge status: Home / Self Care | Payer: Medicare PPO | Source: Ambulatory Visit | Attending: Internal Medicine | Admitting: Internal Medicine

## 2013-09-21 DIAGNOSIS — E049 Nontoxic goiter, unspecified: Secondary | ICD-10-CM

## 2013-09-21 DIAGNOSIS — E0520 Thyrotoxicosis with toxic multinodular goiter without thyrotoxic crisis or storm: Secondary | ICD-10-CM | POA: Insufficient documentation

## 2013-09-21 DIAGNOSIS — E041 Nontoxic single thyroid nodule: Secondary | ICD-10-CM

## 2013-09-21 DIAGNOSIS — E0590 Thyrotoxicosis, unspecified without thyrotoxic crisis or storm: Secondary | ICD-10-CM

## 2013-09-22 ENCOUNTER — Encounter (HOSPITAL_COMMUNITY)
Admission: RE | Admit: 2013-09-22 | Discharge: 2013-09-22 | Disposition: A | Discharge status: Home / Self Care | Payer: Medicare PPO | Source: Ambulatory Visit | Attending: Internal Medicine | Admitting: Internal Medicine

## 2013-09-22 MED ORDER — SODIUM PERTECHNETATE TC 99M INJECTION
10.0000 | Freq: Once | INTRAVENOUS | Status: AC | PRN
  Administered 2013-09-22: 10 via INTRAVENOUS

## 2013-10-22 ENCOUNTER — Other Ambulatory Visit (HOSPITAL_COMMUNITY): Payer: Self-pay | Admitting: Cardiovascular Disease

## 2013-10-22 ENCOUNTER — Ambulatory Visit (HOSPITAL_COMMUNITY)
Admission: RE | Admit: 2013-10-22 | Discharge: 2013-10-22 | Disposition: A | Discharge status: Home / Self Care | Payer: Medicare PPO | Source: Ambulatory Visit | Attending: Cardiovascular Disease | Admitting: Cardiovascular Disease

## 2013-10-22 DIAGNOSIS — I739 Peripheral vascular disease, unspecified: Secondary | ICD-10-CM | POA: Insufficient documentation

## 2013-10-22 NOTE — Progress Notes (Signed)
Lower Extremity Arterial Duplex Completed. °Brianna L Mazza,RVT °

## 2013-11-20 ENCOUNTER — Encounter (HOSPITAL_COMMUNITY): Payer: Self-pay | Admitting: *Deleted

## 2013-11-20 ENCOUNTER — Ambulatory Visit (INDEPENDENT_AMBULATORY_CARE_PROVIDER_SITE_OTHER): Payer: Medicare PPO | Admitting: Cardiovascular Disease

## 2013-11-20 ENCOUNTER — Encounter: Payer: Self-pay | Admitting: Cardiovascular Disease

## 2013-11-20 VITALS — BP 142/80 | HR 66 | Resp 16 | Ht 64.5 in | Wt 199.6 lb

## 2013-11-20 DIAGNOSIS — Z79899 Other long term (current) drug therapy: Secondary | ICD-10-CM

## 2013-11-20 DIAGNOSIS — I2510 Atherosclerotic heart disease of native coronary artery without angina pectoris: Secondary | ICD-10-CM

## 2013-11-20 DIAGNOSIS — I10 Essential (primary) hypertension: Secondary | ICD-10-CM

## 2013-11-20 DIAGNOSIS — I739 Peripheral vascular disease, unspecified: Secondary | ICD-10-CM

## 2013-11-20 DIAGNOSIS — E119 Type 2 diabetes mellitus without complications: Secondary | ICD-10-CM

## 2013-11-20 DIAGNOSIS — I701 Atherosclerosis of renal artery: Secondary | ICD-10-CM

## 2013-11-20 DIAGNOSIS — R079 Chest pain, unspecified: Secondary | ICD-10-CM

## 2013-11-20 DIAGNOSIS — I6529 Occlusion and stenosis of unspecified carotid artery: Secondary | ICD-10-CM

## 2013-11-20 DIAGNOSIS — E785 Hyperlipidemia, unspecified: Secondary | ICD-10-CM

## 2013-11-20 DIAGNOSIS — I480 Paroxysmal atrial fibrillation: Secondary | ICD-10-CM

## 2013-11-20 DIAGNOSIS — I4891 Unspecified atrial fibrillation: Secondary | ICD-10-CM

## 2013-11-20 NOTE — Patient Instructions (Signed)
Your physician recommends that you schedule a follow-up appointment in: 6 Months  Your physician has requested that you have a lexiscan myoview. For further information please visit [email or web address]. Please follow instruction sheet, as given.  Your physician has requested that you have a carotid duplex. This test is an ultrasound of the carotid arteries in your neck. It looks at blood flow through these arteries that supply the brain with blood. Allow one hour for this exam. There are no restrictions or special instructions. In September  Your physician recommends that you return for lab work CMP, FASTINGS LIPIDS, A1C

## 2013-11-23 ENCOUNTER — Encounter: Payer: Self-pay | Admitting: Cardiovascular Disease

## 2013-11-23 DIAGNOSIS — I701 Atherosclerosis of renal artery: Secondary | ICD-10-CM | POA: Insufficient documentation

## 2013-11-23 NOTE — Assessment & Plan Note (Addendum)
Almost one year status post left carotid stenting, she is due to have a followup duplex ultrasound. 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 There is also 50% stenosis in the M1 segment of the left middle cerebral artery

## 2013-11-23 NOTE — Assessment & Plan Note (Signed)
Currently asymptomatic. Last duplex ultrasound performed in April 2015 showed bilateral ABI of 0.6. There were stenoses bilaterally in the mid to distal superficial femoral artery, right greater than left there was bilateral 3 vessel runoff. Mild pain left > right. Walks at a gym once a week and has to sit down after 10 laps. The pain does not prevent her from doing any activity. Continue to monitor, for for revascularization if she develops claudication. Right now I don't think her symptoms are vascular in etiology.

## 2013-11-23 NOTE — Assessment & Plan Note (Signed)
Chest pain syndrome is not typical, but deserves further evaluation. We'll schedule for a nuclear perfusion study.

## 2013-11-23 NOTE — Progress Notes (Signed)
Patient ID: [name], female   DOB: December 12, 1950, 63 y.o.   MRN: [id number]     Reason for office visit CAD s/p CABG, PAD, ASCVD, PAF  This is a 63 y.o.woman with a past medical history significant for PVD, PAF, carotid disease and CAD.  She had CABG X 3 with LIMA-LAD, SVG-OM,SVG-RCA 2004. Re studied in 2005 and this showed patent grafts. She does have distal LAD and OM disease. Her last Myoview was in 7/12 and was low risk. Her most recent echocardiogram in January 2014 showed normal left ventricular systolic function and regional wall motion. Echocardiogram in January 2014 showed normal left ventricular systolic function and wall motion with an estimated systolic PA pressure of 44 mm Hg She had AF in Jan 2013. In March of 2013 she had DCCV, which is when I first met her. In May of 2013 was in NSR on Metoprolol and Multaq.  She has PAD and has had prior renal and popliteal stenting.  By Duplex US April 2015, bilateral ABI 0.60, 70-99% stenosis of right SFA, 50-69% on left. Last renal artery scan showed less than 60% stenosis bilaterally, with a widely patent stent on the right side. She had left carotid stenting 02/12/12 and post op she was hypotensive and bradycardic. She has hyperlipidemia, diabetes mellitus and hypertension. Her most recent serum creatinine on September was 1.6, but 2 years ago her creatinine was completely normal at 0.7.  She has not had problems of shortness of breath or edema but has had some atypical chest discomfort. It occurs with exertion but the pattern is inconsistent. Mild pain in her legs, left > right. Walks at a gym once a week and has to sit down after 10 laps. The pain does not prevent her from doing any activity. No neurological complaints.  A couple of weeks ago she had vertigo (room was spinning when she turned over in bed) but this has subsequently improved. Her reported glycemic control is fair but she has not had a recent hemoglobin A1c. Her lipids on  high dose Crestor are satisfactory, except for a borderline low HDL at 41. She continues to take both aspirin and clopidogrel. Her blood pressure home is usually in the 120s to 130s, although it is a little higher today.    No Known Allergies  Current Outpatient Prescriptions  Medication Sig Dispense Refill  . amLODipine (NORVASC) 10 MG tablet daily.      Marland Kitchen aspirin 81 MG tablet Take 81 mg by mouth daily.      . clopidogrel (PLAVIX) 75 MG tablet Take 75 mg by mouth daily.      Marland Kitchen DIOVAN 160 MG tablet Take 160 mg by mouth 2 (two) times daily.       . lansoprazole (PREVACID) 15 MG capsule Take 15 mg by mouth daily.      . metFORMIN (GLUCOPHAGE) 1000 MG tablet Take 500 mg by mouth daily with breakfast.       . metoprolol (LOPRESSOR) 50 MG tablet Take 50 mg by mouth daily.       . rosuvastatin (CRESTOR) 40 MG tablet Take 40 mg by mouth daily.      Marland Kitchen triamterene-hydrochlorothiazide (MAXZIDE-25) 37.5-25 MG per tablet Take 1 tablet by mouth daily.       No current facility-administered medications for this visit.    Past Medical History  Diagnosis Date  . Irregular heart beat   . CAD (coronary artery disease)   . Myocardial infarction 2002  . Peripheral  vascular disease   . 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 Cardioversion  09/11/2011    Procedure: CARDIOVERSION;  Surgeon: Sanda Klein, MD;  Location: [facility] OR;  Service: Cardiovascular;  Laterality: N/A;  . Coronary artery bypass graft  2003  . Carotid stent  02/12/12    Carotid Stent Insertion, Carotid Angiogram    Family History  Problem Relation Age of Onset  . Deep vein thrombosis Sister   . Diabetes Sister   . Heart disease Sister   . Hyperlipidemia Sister   . Hypertension Sister   . Diabetes Brother   . Hypertension Son     History   Social History  . Marital Status: Married    Spouse Name: N/A    Number of Children: N/A  . Years of Education: N/A   Occupational History  .  Not on file.   Social History Main Topics  . Smoking status: Former Smoker    Quit date: 07/02/2001  . Smokeless tobacco: Not on file  . Alcohol Use: Yes  . Drug Use: No  . Sexual Activity: Not on file   Other Topics Concern  . Not on file   Social History Narrative  . No narrative on file    Review of systems: The patient specifically denies any chest pain at rest, dyspnea at rest or with exertion, orthopnea, paroxysmal nocturnal dyspnea, syncope, palpitations, focal neurological deficits, intermittent claudication, lower extremity edema, unexplained weight gain, cough, hemoptysis or wheezing.  The patient also denies abdominal pain, nausea, vomiting, dysphagia, diarrhea, constipation, polyuria, polydipsia, dysuria, hematuria, frequency, urgency, abnormal bleeding or bruising, fever, chills, unexpected weight changes, mood swings, change in skin or hair texture, change in voice quality, auditory or visual problems, allergic reactions or rashes, new musculoskeletal complaints other than usual "aches and pains".   PHYSICAL EXAM BP 142/80  Pulse 66  Resp 16  Ht 5' 4.5" (1.638 m)  Wt 199 lb 9.6 oz (90.538 kg)  BMI 33.74 kg/m2  General: Alert, oriented x3, no distress Head: no evidence of trauma, PERRL, EOMI, no exophtalmos or lid lag, no myxedema, no xanthelasma; normal ears, nose and oropharynx Neck: normal jugular venous pulsations and no hepatojugular reflux; brisk carotid pulses without delay and no carotid bruits Chest: clear to auscultation, no signs of consolidation by percussion or palpation, normal fremitus, symmetrical and full respiratory excursions Cardiovascular: normal position and quality of the apical impulse, regular rhythm, normal first and second heart sounds, no  rubs, positive S4 gallop, early peaking short systolic ejection murmur 1/6 at the aortic focus. Abdomen: no tenderness or distention, no masses by palpation, no abnormal pulsatility or arterial bruits,  normal bowel sounds, no hepatosplenomegaly Extremities: no clubbing, cyanosis or edema; 2+ radial, ulnar and brachial pulses bilaterally; 2+ right femoral, posterior tibial and dorsalis pedis pulses; 2+ left femoral, posterior tibial and dorsalis pedis pulses; no subclavian or femoral bruits Neurological: grossly nonfocal   EKG: Normal sinus rhythm, incomplete right bundle-branch block (old), nonspecific T wave inversion V3 through V6 , QTC 467 ms  Lipid Panel     Component Value Date/Time   CHOL 116 03/31/2013 0806   TRIG 116 03/31/2013 0806   HDL 41 03/31/2013 0806   CHOLHDL 2.8 03/31/2013 0806   VLDL 23 03/31/2013 0806   LDLCALC 52 03/31/2013 0806    BMET    Component Value Date/Time   NA 141 03/31/2013 0806   K 4.0 03/31/2013 0806  CL 100 03/31/2013 0806   CO2 32 03/31/2013 0806   GLUCOSE 137* 03/31/2013 0806   BUN 25* 03/31/2013 0806   CREATININE 1.60* 03/31/2013 0806   CREATININE 0.72 02/13/2012 0430   CALCIUM 10.0 03/31/2013 0806   GFRNONAA >90 02/13/2012 0430   GFRAA >90 02/13/2012 0430     ASSESSMENT AND PLAN Occlusion and stenosis of carotid artery without mention of cerebral infarction Almost one year status post left carotid stenting, she is due to have a followup duplex ultrasound. 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 There is also 50% stenosis in the M1 segment of the left middle cerebral artery  HTN (hypertension) Line elevation in blood pressure, usually lower at home, no changes are made to her medications. Dr. Rollene Fare actually reduced her metoprolol dose at her last appointment for bradycardia.  PAF (paroxysmal atrial fibrillation) She is no longer any true antiarrhythmics but takes a beta blocker. No clinically symptomatic recurrence. She is on aspirin and clopidogrel for stroke prevention. Dr. Rollene Fare discontinued her warfarin in December of 2013 due to  concerns regarding bleeding risk while on "triple therapy". She does not have a history of stroke or TIA. Her stroke risk is high due to the presence of vascular disease and diabetes. We'll continue dual antiplatelet therapy for now, but if clopidogrel to stop , she should start taking warfarin or equivalents normal anticoagulant.  PVD (peripheral vascular disease) Currently asymptomatic. Last duplex ultrasound performed in April 2015 showed bilateral ABI of 0.6. There were stenoses bilaterally in the mid to distal superficial femoral artery, right greater than left there was bilateral 3 vessel runoff. Mild pain left > right. Walks at a gym once a week and has to sit down after 10 laps. The pain does not prevent her from doing any activity. Continue to monitor, for for revascularization if she develops claudication. Right now I don't think her symptoms are vascular in etiology.   Right renal artery stenosis s/p stent Need to recheck her renal function parameters. Her last creatinine in September of 2014 was higher in the past. May need to repeat renal duplex ultrasound as well.  Diabetes mellitus, Type 2 NIDDM Check hemoglobin A1c  CAD, CABG X 3 2004, distal LAD/OM disease 2005, Myoview low risk 7/12 Chest pain syndrome is not typical, but deserves further evaluation. We'll schedule for a nuclear perfusion study.   Patient Instructions  Your physician recommends that you schedule a follow-up appointment in: 6 Months  Your physician has requested that you have a lexiscan myoview. For further information please visit [email or web address]. Please follow instruction sheet, as given.  Your physician has requested that you have a carotid duplex. This test is an ultrasound of the carotid arteries in your neck. It looks at blood flow through these arteries that supply the brain with blood. Allow one hour for this exam. There are no restrictions or special instructions. In September  Your physician  recommends that you return for lab work CMP, FASTINGS LIPIDS, A1C         Orders Placed This Encounter  Procedures  . Comp Met (CMET)  . Lipid panel  . HgB A1c  . Myocardial Perfusion Imaging  . EKG 12-Lead  . Carotid duplex   Meds ordered this encounter  Medications  . clopidogrel (PLAVIX) 75 MG tablet    Sig: Take 75 mg by mouth daily.  Marland Kitchen triamterene-hydrochlorothiazide (MAXZIDE-25)  37.5-25 MG per tablet    Sig: Take 1 tablet by mouth daily.    Naithan Delage  Sanda Klein, MD, [facility] [phone] office [phone] pager

## 2013-11-23 NOTE — Assessment & Plan Note (Signed)
Need to recheck her renal function parameters. Her last creatinine in September of 2014 was higher in the past. May need to repeat renal duplex ultrasound as well.

## 2013-11-23 NOTE — Assessment & Plan Note (Addendum)
She is no longer any true antiarrhythmics but takes a beta blocker. No clinically symptomatic recurrence. She is on aspirin and clopidogrel for stroke prevention. Dr. Rollene Fare discontinued her warfarin in December of 2013 due to concerns regarding bleeding risk while on "triple therapy". She does not have a history of stroke or TIA. Her stroke risk is high due to the presence of vascular disease and diabetes. We'll continue dual antiplatelet therapy for now, but if clopidogrel to stop , she should start taking warfarin or equivalents normal anticoagulant.

## 2013-11-23 NOTE — Assessment & Plan Note (Signed)
Line elevation in blood pressure, usually lower at home, no changes are made to her medications. Dr. Rollene Fare actually reduced her metoprolol dose at her last appointment for bradycardia.

## 2013-11-23 NOTE — Assessment & Plan Note (Signed)
Check hemoglobin A1c.

## 2013-11-30 LAB — COMPREHENSIVE METABOLIC PANEL
ALK PHOS: 65 U/L (ref 39–117)
ALT: 10 U/L (ref 0–35)
AST: 14 U/L (ref 0–37)
Albumin: 4.2 g/dL (ref 3.5–5.2)
BILIRUBIN TOTAL: 0.5 mg/dL (ref 0.2–1.2)
BUN: 24 mg/dL — ABNORMAL HIGH (ref 6–23)
CO2: 25 meq/L (ref 19–32)
Calcium: 9.6 mg/dL (ref 8.4–10.5)
Chloride: 103 mEq/L (ref 96–112)
Creat: 1.22 mg/dL — ABNORMAL HIGH (ref 0.50–1.10)
Glucose, Bld: 145 mg/dL — ABNORMAL HIGH (ref 70–99)
Potassium: 3.7 mEq/L (ref 3.5–5.3)
SODIUM: 138 meq/L (ref 135–145)
TOTAL PROTEIN: 6.9 g/dL (ref 6.0–8.3)

## 2013-11-30 LAB — LIPID PANEL
Cholesterol: 101 mg/dL (ref 0–200)
HDL: 43 mg/dL (ref >39)
LDL CALC: 44 mg/dL (ref 0–99)
Total CHOL/HDL Ratio: 2.3 Ratio
Triglycerides: 70 mg/dL (ref <150)
VLDL: 14 mg/dL (ref 0–40)

## 2013-11-30 LAB — HEMOGLOBIN A1C
HEMOGLOBIN A1C: 7.8 % — AB (ref <5.7)
MEAN PLASMA GLUCOSE: 177 mg/dL — AB (ref <117)

## 2013-12-01 ENCOUNTER — Encounter: Payer: Self-pay | Admitting: *Deleted

## 2013-12-10 ENCOUNTER — Telehealth (HOSPITAL_COMMUNITY): Payer: Self-pay

## 2013-12-15 ENCOUNTER — Ambulatory Visit (HOSPITAL_COMMUNITY)
Admission: RE | Admit: 2013-12-15 | Discharge: 2013-12-15 | Disposition: A | Discharge status: Home / Self Care | Payer: Medicare PPO | Source: Ambulatory Visit | Attending: Cardiovascular Disease | Admitting: Cardiovascular Disease

## 2013-12-15 DIAGNOSIS — R0609 Other forms of dyspnea: Secondary | ICD-10-CM | POA: Insufficient documentation

## 2013-12-15 DIAGNOSIS — I739 Peripheral vascular disease, unspecified: Secondary | ICD-10-CM | POA: Insufficient documentation

## 2013-12-15 DIAGNOSIS — Z87891 Personal history of nicotine dependence: Secondary | ICD-10-CM | POA: Insufficient documentation

## 2013-12-15 DIAGNOSIS — Z8249 Family history of ischemic heart disease and other diseases of the circulatory system: Secondary | ICD-10-CM | POA: Insufficient documentation

## 2013-12-15 DIAGNOSIS — Z951 Presence of aortocoronary bypass graft: Secondary | ICD-10-CM | POA: Insufficient documentation

## 2013-12-15 DIAGNOSIS — R0989 Other specified symptoms and signs involving the circulatory and respiratory systems: Secondary | ICD-10-CM | POA: Insufficient documentation

## 2013-12-15 DIAGNOSIS — I2510 Atherosclerotic heart disease of native coronary artery without angina pectoris: Secondary | ICD-10-CM

## 2013-12-15 DIAGNOSIS — I779 Disorder of arteries and arterioles, unspecified: Secondary | ICD-10-CM | POA: Insufficient documentation

## 2013-12-15 DIAGNOSIS — I10 Essential (primary) hypertension: Secondary | ICD-10-CM | POA: Insufficient documentation

## 2013-12-15 DIAGNOSIS — I4891 Unspecified atrial fibrillation: Secondary | ICD-10-CM | POA: Insufficient documentation

## 2013-12-15 DIAGNOSIS — R079 Chest pain, unspecified: Secondary | ICD-10-CM

## 2013-12-15 DIAGNOSIS — E119 Type 2 diabetes mellitus without complications: Secondary | ICD-10-CM | POA: Insufficient documentation

## 2013-12-15 DIAGNOSIS — E669 Obesity, unspecified: Secondary | ICD-10-CM | POA: Insufficient documentation

## 2013-12-15 DIAGNOSIS — I4510 Unspecified right bundle-branch block: Secondary | ICD-10-CM | POA: Insufficient documentation

## 2013-12-15 MED ORDER — TECHNETIUM TC 99M SESTAMIBI GENERIC - CARDIOLITE
29.2000 | Freq: Once | INTRAVENOUS | Status: AC | PRN
  Administered 2013-12-15: 29.2 via INTRAVENOUS

## 2013-12-15 MED ORDER — TECHNETIUM TC 99M SESTAMIBI GENERIC - CARDIOLITE
9.9000 | Freq: Once | INTRAVENOUS | Status: AC | PRN
  Administered 2013-12-15: 10 via INTRAVENOUS

## 2013-12-15 MED ORDER — REGADENOSON 0.4 MG/5ML IV SOLN
0.4000 mg | Freq: Once | INTRAVENOUS | Status: AC
  Administered 2013-12-15: 0.4 mg via INTRAVENOUS

## 2013-12-15 NOTE — Procedures (Addendum)
[facility] [address] [place] [id number]  Cardiology Nuclear Med Study  [name] is a 63 y.o. female     MRN : [id number]     DOB: [birth year]  Procedure Date: 12/15/2013  Nuclear Med Background Indication for Stress Test:  Graft Patency and Abnormal EKG History:  CAD;CABG X3;PAF;Last NUC MPI on 01/17/2011-low risk; Cardiac Risk Factors: Carotid Disease, Family History - CAD, History of Smoking, Hypertension, Lipids, NIDDM, Obesity, PVD and RBBB  Symptoms:  Chest Pain and DOE   Nuclear Pre-Procedure Caffeine/Decaff Intake:  1:00am NPO After: 11am   IV Site: R Hand  IV 0.9% NS with Angio Cath:  22g  Chest Size (in):  n/a IV Started by: Rolene Course, RN  Height: 5\' 4"  (1.626 m)  Cup Size: C  BMI:  Body mass index is 34.14 kg/(m^2). Weight:  199 lb (90.266 kg)   Tech Comments:  n/a    Nuclear Med Study 1 or 2 day study: 1 day  Stress Test Type:  [place] Provider:  Sanda Klein, MD   Resting Radionuclide: Technetium 38m Sestamibi  Resting Radionuclide Dose: 9.9 mCi   Stress Radionuclide:  Technetium 70m Sestamibi  Stress Radionuclide Dose: 29.2 mCi           Stress Protocol Rest HR: 71 Stress HR: 88  Rest BP: 157/93 Stress BP: 173/88  Exercise Time (min): n/a METS: n/a   Predicted Max HR: 158 bpm % Max HR: 59.49 bpm Rate Pressure Product: 16262  Dose of Adenosine (mg):  n/a Dose of Lexiscan: 0.4 mg  Dose of Atropine (mg): n/a Dose of Dobutamine: n/a mcg/kg/min (at max HR)  Stress Test Technologist: Leane Para, CCT Nuclear Technologist: Imagene Riches, CNMT   Rest Procedure:  Myocardial perfusion imaging was performed at rest 45 minutes following the intravenous administration of Technetium 31m Sestamibi. Stress Procedure:  The patient received IV Lexiscan 0.4 mg over 15-seconds.  Technetium 4m Sestamibi injected IV at 30-seconds.  There were no significant  changes with Lexiscan.  Quantitative spect images were obtained after a 45 minute delay.  Transient Ischemic Dilatation (Normal <1.22):  1.17  QGS EDV:  76 ml QGS ESV:  26 ml LV Ejection Fraction: 66%       Rest ECG: NSR-RBBB  Stress ECG: Developed inferior and anterlateral TWI  QPS Raw Data Images:  Normal; no motion artifact; normal heart/lung ratio. Stress Images:  There is decreased uptake in the anterior wall. Rest Images:  Normal homogeneous uptake in all areas of the myocardium. Subtraction (SDS):  These findings are consistent with ischemia.  Impression Exercise Capacity:  Lexiscan with no exercise. BP Response:  Normal blood pressure response. Clinical Symptoms:  Mild chest pain/dyspnea. ECG Impression:  Developed TWI inferior and AL leads Comparison with Prior Nuclear Study: New ischemia since prior study  Overall Impression:  Ischemia new since prior study in anteorlateral wall. Follow up with Dr. Sallyanne Kuster. Further evaluation may be warranted. There is also RV free wall uptake c/w pressure and or volume overload.  LV Wall Motion:  NL LV Function; NL Wall Motion   Elaine Harp, MD  12/15/2013 5:57 PM

## 2013-12-16 ENCOUNTER — Encounter: Payer: Self-pay | Admitting: Cardiovascular Disease

## 2013-12-16 ENCOUNTER — Ambulatory Visit (INDEPENDENT_AMBULATORY_CARE_PROVIDER_SITE_OTHER): Payer: Medicare PPO | Admitting: Cardiology

## 2013-12-16 ENCOUNTER — Encounter: Payer: Self-pay | Admitting: Cardiology

## 2013-12-16 VITALS — BP 146/84 | HR 82 | Ht 65.5 in | Wt 200.3 lb

## 2013-12-16 DIAGNOSIS — I4891 Unspecified atrial fibrillation: Secondary | ICD-10-CM

## 2013-12-16 DIAGNOSIS — I480 Paroxysmal atrial fibrillation: Secondary | ICD-10-CM

## 2013-12-16 DIAGNOSIS — R9439 Abnormal result of other cardiovascular function study: Secondary | ICD-10-CM

## 2013-12-16 DIAGNOSIS — I739 Peripheral vascular disease, unspecified: Secondary | ICD-10-CM

## 2013-12-16 DIAGNOSIS — E139 Other specified diabetes mellitus without complications: Secondary | ICD-10-CM

## 2013-12-16 DIAGNOSIS — E098 Drug or chemical induced diabetes mellitus with unspecified complications: Secondary | ICD-10-CM

## 2013-12-16 MED ORDER — VALSARTAN 160 MG PO TABS: 160.0000 mg | ORAL_TABLET | Freq: Two times a day (BID) | ORAL | Status: DC

## 2013-12-16 MED ORDER — VALSARTAN 320 MG PO TABS: 320.0000 mg | ORAL_TABLET | Freq: Every day | ORAL | Status: DC

## 2013-12-16 MED ORDER — NITROGLYCERIN 0.4 MG SL SUBL: 0.4000 mg | SUBLINGUAL_TABLET | SUBLINGUAL | Status: DC | PRN

## 2013-12-16 NOTE — Patient Instructions (Addendum)
Continue  Diovan 160 mg  One tablet twice a day .  Start IMDUR 30 MG  One tablet at bedtime.  Use NTG if needed  Your physician recommends that you keep schedule  follow-up appointment  With Dr Sallyanne Kuster.

## 2013-12-17 ENCOUNTER — Telehealth: Payer: Self-pay | Admitting: Cardiology

## 2013-12-17 MED ORDER — NITROGLYCERIN 0.4 MG SL SUBL: 0.4000 mg | SUBLINGUAL_TABLET | SUBLINGUAL | Status: DC | PRN

## 2013-12-17 NOTE — Telephone Encounter (Signed)
Rx was sent to pharmacy electronically. 

## 2013-12-17 NOTE — Telephone Encounter (Signed)
Pt said she saw Dr Ellyn Hack yesterday and the nurse was suppose to have called her Imdur and Nitro in. She went to pick it up and it was not there.Please call it in today to [facility]-[phone].

## 2013-12-18 ENCOUNTER — Encounter: Payer: Self-pay | Admitting: Cardiology

## 2013-12-18 ENCOUNTER — Telehealth: Payer: Self-pay | Admitting: Cardiology

## 2013-12-18 DIAGNOSIS — R9439 Abnormal result of other cardiovascular function study: Secondary | ICD-10-CM | POA: Insufficient documentation

## 2013-12-18 MED ORDER — ISOSORBIDE MONONITRATE ER 30 MG PO TB24: 30.0000 mg | ORAL_TABLET | Freq: Every day | ORAL | Status: DC

## 2013-12-18 NOTE — Telephone Encounter (Signed)
Pt said she still not get the prescription for Imdur that Dr Ellyn Hack prescribed. Please call that to [phone].

## 2013-12-18 NOTE — Telephone Encounter (Signed)
Rx sent electronically to [facility] for Imdur 30mg  #30 w/5 refills.

## 2013-12-18 NOTE — Assessment & Plan Note (Signed)
No significant complaints to suggest recurrence.

## 2013-12-18 NOTE — Progress Notes (Signed)
PCP: Tivis Ringer, MD  Clinic Note: Chief Complaint  Patient presents with  . Follow-up    result for abn myyoview, no chest pain ,no sob ,no edema   HPI: [name] is a 63 y.o. female with a Cardiovascular Problem List below who presents today for close followup of abnormal Myoview stress test. She is a patient of Dr. Sanda Klein, who was last seen in on May 22 as a new patient start with Dr. Sallyanne Kuster after being a long-term patient of Dr. Terance Ice. She has a history of CAD with CABG x3 2004 (LIMA-LAD, SVG-OM, SVG-RCA - with patent grafts, but significant distal LAD and OM disease not amenable to PCI in 2005). She had a Myoview in July 2012 was read as low risk. She also has PAF (status post DCCV in March 2013), PVD/PAD (popliteal A & renal A stents) as well as carotid disease. During that visit, she did note some atypical sounding chest discomfort that inconsistently occur with exertion. It also can occur at rest. She walks up a 10 collapse at the gym at least once a week, and has to sit down briefly. She is more limited by her claudication leg pain she has dyspnea or chest discomfort. Dr. Sallyanne Kuster had ordered a LexiScan Myoview for routine followup, she is now here to discuss the results:   EF 66%, developed inferior and anterolateral T wave inversions with a LexiScan  Decreased anterior wall uptake consistent with ischemia  Interval History: On my examination and evaluation, she really does not note significant chest discomfort symptoms whatsoever. She is able to do her walking most days without any symptoms. She is not having heart failure symptoms. She really does not note exertional dyspnea. I also reviewed her angiographic images and noted that they target vessels of her grafts that were patent were very small and diameter and likely not PCI amenable target. She actually was treated with EECP in the past.  Currently she will he denies any PND, orthopnea or edema.  No rapid irregular heartbeat/palpitations, syncope/near-syncope TIA/amaurosis fugax. No melena, hematochezia, hematuria, epistaxis. Mild claudication that is in no way limiting.  Past Medical History  Diagnosis Date  . Irregular heart beat   . CAD (coronary artery disease) 2004    Multivessel, status post CABG x3  . Myocardial infarction 2002  . Peripheral vascular disease     Status post popliteal artery and renal artery stents.  . 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 Re studied in 2005 and this showed patent grafts. She does have distal LAD and OM disease. Her last Myoview was in 7/12 and was low risk. Her most recent echocardiogram in January 2014 showed normal left ventricular systolic function and regional wall motion. Echocardiogram in January 2014 showed normal left ventricular systolic function and wall motion with an estimated systolic PA pressure of 44 mm Hg  She had AF in Jan 2013. In March of 2013 she had DCCV, which is when I first met her. In May of 2013 was in NSR on Metoprolol and Multaq.  She has PAD and has had prior renal and popliteal stenting.  By Duplex US April 2015, bilateral ABI 0.60, 70-99% stenosis of right SFA, 50-69% on left.  Last renal artery scan showed less than 60% stenosis bilaterally, with a widely patent stent on the right side.  She had left carotid stenting 02/12/12   Prior Cardiac Evaluation and Past Surgical History: Past Surgical  History  Procedure Laterality Date  . Cardioversion  09/11/2011    Procedure: CARDIOVERSION;  Surgeon: Sanda Klein, MD;  Location: [facility] OR;  Service: Cardiovascular;  Laterality: N/A;  . Coronary artery bypass graft  2003    LIMA-LAD  . Carotid stent  02/12/12    Carotid Stent Insertion, Carotid Angiogram   No Known Allergies Current Outpatient Prescriptions on File Prior to Visit  Medication Sig Dispense Refill  . amLODipine (NORVASC) 10 MG tablet daily.      Marland Kitchen aspirin 81 MG tablet  Take 81 mg by mouth daily.      . clopidogrel (PLAVIX) 75 MG tablet Take 75 mg by mouth daily.      . lansoprazole (PREVACID) 15 MG capsule Take 15 mg by mouth daily.      . metFORMIN (GLUCOPHAGE) 1000 MG tablet Take 500 mg by mouth daily with breakfast.       . metoprolol (LOPRESSOR) 50 MG tablet Take 50 mg by mouth daily.       . rosuvastatin (CRESTOR) 40 MG tablet Take 40 mg by mouth daily.      Marland Kitchen triamterene-hydrochlorothiazide (MAXZIDE-25) 37.5-25 MG per tablet Take 1 tablet by mouth daily.       No current facility-administered medications on file prior to visit.   History  Substance Use Topics  . Smoking status: Former Smoker    Quit date: 07/02/2001  . Smokeless tobacco: Not on file  . Alcohol Use: Yes   Family History  Problem Relation Age of Onset  . Deep vein thrombosis Sister   . Diabetes Sister   . Heart disease Sister   . Hyperlipidemia Sister   . Hypertension Sister   . Diabetes Brother   . Hypertension Son     ROS: A comprehensive Review of Systems - Negative except Mild musculoskeletal aches and pains. Otherwise negative in indicated in history of present illness  PHYSICAL EXAM BP 146/84  Pulse 82  Ht 5' 5.5" (1.664 m)  Wt 200 lb 4.8 oz (90.855 kg)  BMI 32.81 kg/m2 General: Alert, oriented x3, no distress  Head: [place]/AT, PERRL, EOMI, MMM Neck: Supple, no LAM, JVD or carotid bruit Chest: CTAb, nonlabored, good air movement; no W./R./R. Cardiovascular: Non-displaced PMI; RRR, nl S1 S2; + S4 & early peaking, short SEM ~1/6 @ RUSB .  Abdomen: Soft, NT, ND, NABS, no HSM.  No abnormal pulsatility or arterial bruits,  Extremities:  No C./C./E.;  2+ bilateral UE; 2+ right femoral, posterior tibial and dorsalis pedis pulses; 2+ left femoral, posterior tibial and dorsalis pedis pulses; no subclavian or femoral bruits  Neurological: grossly nonfocal   Adult ECG Report - not checked   Recent Labs: none    ASSESSMENT / PLAN: After examining the patient and  discussing her symptoms with her--mostly lack of symptoms, I truly think that we do not need to pursue any additional evaluation after the stress test. While the usual course of action would bel to pursue invasive evaluation with cardiac catheterization with possible PCI, from her initial post op catheterization, I was able to determine that her down to target or not is still PCI amenable to the due to size. I do not think it PTCA on brace small target vessels could potentially make her feel any better. I simply would think the optic for increasing medical management would be the best plan.  Abnormal nuclear stress test There does indeed appear to be a defect in the anterior wall. In reviewing your angiographic films from  PCI it would appear that there is really is no retrograde flow from the LIMA is lesser small LAD. The distal LAD is diffusely diseased and not really PCI amenable. His also similar diffuse disease in other graft vessels. With this data in hand, I do feel that the best plan would be conservative management. If medical therapy does not work, we then may need to consider referral back for EECP versus attempting cardiac catheterization for further clarification. I simply do not think that things will change much in her native coronaries since 2005.  CAD, CABG X 3 2004, distal LAD/OM disease 2005, Myoview low risk 7/12 No longer low risk Myoview. I'm not sure why this occurred, but it does appear that the previous study did not suggest inferior ischemia to suggest ischemia elsewhere. This was an Intermediate Myoview but consistent with known anatomy. I don't think that her chest discomfort she is feeling 4 was consistent with ischemic etiology.  I will simply add low-dose Imdur. He started on aggressive therapy with twice a day Diovan and moderately dose betaContinue aspirin plus Plavix and PPI and beta blocker.   She is also on high-dose amlodipine, and is on high-dose Crestor.  Would  consider Ranexa next if symptoms are truly occurring.  PAF (paroxysmal atrial fibrillation) No significant complaints to suggest recurrence.    No orders of the defined types were placed in this encounter.   Meds ordered this encounter  Medications  . DISCONTD: valsartan (DIOVAN) 320 MG tablet    Sig: Take 1 tablet (320 mg total) by mouth daily.    Dispense:  30 tablet    Refill:  11  . DISCONTD: nitroGLYCERIN (NITROSTAT) 0.4 MG SL tablet    Sig: Place 1 tablet (0.4 mg total) under the tongue every 5 (five) minutes as needed for chest pain.    Dispense:  25 tablet    Refill:  6  . DISCONTD: valsartan (DIOVAN) 160 MG tablet    Sig: Take 1 tablet (160 mg total) by mouth 2 (two) times daily.    Dispense:  60 tablet    Refill:  11  . valsartan (DIOVAN) 160 MG tablet    Sig: Take 1 tablet (160 mg total) by mouth 2 (two) times daily.    Dispense:  60 tablet    Refill:  11   Despite the limited problems discussed, I did spend at least 30 minutes in addition to the physical examination and history to review results of her Myoview compared to the catheterization films. She agreed that she would prefer to avoid invasive evaluation if possible but understands that if symptoms were to her own, we would then feel more pressed to do further evaluation.  Followup: As scheduled with Dr. Sallyanne Kuster  DAVID W. Ellyn Hack, M.D., M.S. Interventional Cardiologist [facility]

## 2013-12-18 NOTE — Assessment & Plan Note (Signed)
There does indeed appear to be a defect in the anterior wall. In reviewing your angiographic films from PCI it would appear that there is really is no retrograde flow from the LIMA is lesser small LAD. The distal LAD is diffusely diseased and not really PCI amenable. His also similar diffuse disease in other graft vessels. With this data in hand, I do feel that the best plan would be conservative management. If medical therapy does not work, we then may need to consider referral back for EECP versus attempting cardiac catheterization for further clarification. I simply do not think that things will change much in her native coronaries since 2005.

## 2013-12-18 NOTE — Assessment & Plan Note (Signed)
No longer low risk Myoview. I'm not sure why this occurred, but it does appear that the previous study did not suggest inferior ischemia to suggest ischemia elsewhere. This was an Intermediate Myoview but consistent with known anatomy. I don't think that her chest discomfort she is feeling 4 was consistent with ischemic etiology.  I will simply add low-dose Imdur. He started on aggressive therapy with twice a day Diovan and moderately dose betaContinue aspirin plus Plavix and PPI and beta blocker.   She is also on high-dose amlodipine, and is on high-dose Crestor.  Would consider Ranexa next if symptoms are truly occurring.

## 2013-12-23 ENCOUNTER — Other Ambulatory Visit: Payer: Self-pay

## 2013-12-23 MED ORDER — VALSARTAN 160 MG PO TABS: 160.0000 mg | ORAL_TABLET | Freq: Two times a day (BID) | ORAL | Status: DC

## 2013-12-23 NOTE — Telephone Encounter (Signed)
Clarification faxed back to pharmacy.

## 2013-12-28 ENCOUNTER — Telehealth: Payer: Self-pay | Admitting: *Deleted

## 2013-12-28 NOTE — Telephone Encounter (Signed)
Patient walked in to question why she received nitostat and valsartan through the mail from her mail order  Pharmacy. She informs me that she uses [facility] pharmacy and did not understand how she got these medications. After explaining the differences in the medication names -brand vs generic that was on her last AVS I told her that I don't know how she received the mailorder meds. Sometimes the insurance company will require you to get medications via mail order. I'm not sure because according to our records her prescriptions were sent to [facility]. Recommended for her to call customer service to see if they can help her. Patient voiced understanding.

## 2013-12-31 NOTE — Telephone Encounter (Signed)
Encounter complete. 

## 2014-01-19 ENCOUNTER — Other Ambulatory Visit (HOSPITAL_COMMUNITY): Payer: Self-pay | Admitting: Nephrology

## 2014-01-19 DIAGNOSIS — N183 Chronic kidney disease, stage 3 unspecified: Secondary | ICD-10-CM

## 2014-01-19 DIAGNOSIS — I10 Essential (primary) hypertension: Secondary | ICD-10-CM

## 2014-01-19 DIAGNOSIS — N179 Acute kidney failure, unspecified: Secondary | ICD-10-CM

## 2014-01-19 DIAGNOSIS — I701 Atherosclerosis of renal artery: Secondary | ICD-10-CM

## 2014-02-04 ENCOUNTER — Ambulatory Visit (HOSPITAL_COMMUNITY)
Admission: RE | Admit: 2014-02-04 | Discharge: 2014-02-04 | Disposition: A | Discharge status: Home / Self Care | Payer: Medicare PPO | Source: Ambulatory Visit | Attending: Cardiovascular Disease | Admitting: Cardiovascular Disease

## 2014-02-04 DIAGNOSIS — N183 Chronic kidney disease, stage 3 unspecified: Secondary | ICD-10-CM | POA: Diagnosis present

## 2014-02-04 DIAGNOSIS — I10 Essential (primary) hypertension: Secondary | ICD-10-CM

## 2014-02-04 DIAGNOSIS — I701 Atherosclerosis of renal artery: Secondary | ICD-10-CM | POA: Insufficient documentation

## 2014-02-04 DIAGNOSIS — I129 Hypertensive chronic kidney disease with stage 1 through stage 4 chronic kidney disease, or unspecified chronic kidney disease: Secondary | ICD-10-CM | POA: Insufficient documentation

## 2014-02-04 DIAGNOSIS — N179 Acute kidney failure, unspecified: Secondary | ICD-10-CM

## 2014-02-04 NOTE — Progress Notes (Signed)
Renal Artery Duplex Completed. °Brianna L Mazza,RVT °

## 2014-02-10 ENCOUNTER — Telehealth (HOSPITAL_COMMUNITY): Payer: Self-pay | Admitting: *Deleted

## 2014-02-12 ENCOUNTER — Encounter: Payer: Self-pay | Admitting: *Deleted

## 2014-02-15 ENCOUNTER — Encounter: Payer: Self-pay | Admitting: Cardiovascular Disease

## 2014-02-15 ENCOUNTER — Ambulatory Visit (INDEPENDENT_AMBULATORY_CARE_PROVIDER_SITE_OTHER): Payer: Medicare PPO | Admitting: Cardiovascular Disease

## 2014-02-15 VITALS — BP 138/70 | HR 70 | Resp 16 | Ht 65.0 in | Wt 200.0 lb

## 2014-02-15 DIAGNOSIS — I4891 Unspecified atrial fibrillation: Secondary | ICD-10-CM

## 2014-02-15 DIAGNOSIS — R9439 Abnormal result of other cardiovascular function study: Secondary | ICD-10-CM

## 2014-02-15 DIAGNOSIS — I209 Angina pectoris, unspecified: Secondary | ICD-10-CM

## 2014-02-15 DIAGNOSIS — I739 Peripheral vascular disease, unspecified: Secondary | ICD-10-CM

## 2014-02-15 DIAGNOSIS — I6529 Occlusion and stenosis of unspecified carotid artery: Secondary | ICD-10-CM

## 2014-02-15 DIAGNOSIS — I480 Paroxysmal atrial fibrillation: Secondary | ICD-10-CM

## 2014-02-15 DIAGNOSIS — I25810 Atherosclerosis of coronary artery bypass graft(s) without angina pectoris: Secondary | ICD-10-CM

## 2014-02-15 DIAGNOSIS — I10 Essential (primary) hypertension: Secondary | ICD-10-CM

## 2014-02-15 DIAGNOSIS — I25719 Atherosclerosis of autologous vein coronary artery bypass graft(s) with unspecified angina pectoris: Secondary | ICD-10-CM

## 2014-02-15 MED ORDER — CLOPIDOGREL BISULFATE 75 MG PO TABS: 75.0000 mg | ORAL_TABLET | Freq: Every day | ORAL | Status: DC

## 2014-02-15 MED ORDER — TRIAMTERENE-HCTZ 37.5-25 MG PO TABS: 1.0000 | ORAL_TABLET | Freq: Every day | ORAL | Status: DC

## 2014-02-15 MED ORDER — AMLODIPINE BESYLATE 10 MG PO TABS: 10.0000 mg | ORAL_TABLET | Freq: Every day | ORAL | Status: DC

## 2014-02-15 NOTE — Progress Notes (Signed)
Patient ID: ALLEAN MONTFORT, female   DOB: 1950-08-30, 63 y.o.   MRN: [id number]      Reason for office visit CAD, PAD, PAF  Despite her multiple cardiac problems, Mrs. Gorsline has not had any recent acute illness. A couple of months ago she saw Dr. Ellyn Hack in followup after receiving a report of an abnormal myocardial perfusion study. The abnormality was in the distribution of the LAD artery. Dr. Ellyn Hack reviewed the films and found no good options for revascularization. The mammary artery bypass is widely patent without much retrograde flow to the proximal anterior wall and the distal LAD is diffusely diseased, not amenable to PCI. Thankfully, her symptoms are now very well controlled she is able to exercise without complaints of chest discomfort.  This is a 63 y.o.woman with a past medical history significant for PVD, PAF, carotid disease and CAD.  She had CABG X 3 with LIMA-LAD, SVG-OM,SVG-RCA 2004. Re studied in 2005 and this showed patent grafts. She does have distal LAD and OM disease. Her Myoview in 7/12 and was low risk. Repeat in 11/2013 showed new anterolateral ischemia. Her most recent echocardiogram in January 2014 showed normal left ventricular systolic function and regional wall motion. Echocardiogram in January 2014 showed normal left ventricular systolic function and wall motion with an estimated systolic PA pressure of 44 mm Hg  She had AF in Jan 2013. In March of 2013 she had DCCV, which is when I first met her. In May of 2013 was in NSR on Metoprolol and Multaq.  She has PAD and has had prior renal and popliteal stenting.  By Duplex US April 2015, bilateral ABI 0.60, 70-99% stenosis of right SFA, 50-69% on left.  Last renal artery scan showed less than 60% stenosis bilaterally, with a widely patent stent on the right side.  She had left carotid stenting 02/12/12 and post op she was hypotensive and bradycardic.  She has hyperlipidemia, diabetes mellitus and hypertension. Her most  recent serum creatinine on September was 1.6, but 2 years ago her creatinine was completely normal at 0.7.    No Known Allergies  Current Outpatient Prescriptions  Medication Sig Dispense Refill  . amLODipine (NORVASC) 10 MG tablet daily.      Marland Kitchen aspirin 81 MG tablet Take 81 mg by mouth daily.      . clopidogrel (PLAVIX) 75 MG tablet Take 75 mg by mouth daily.      . isosorbide mononitrate (IMDUR) 30 MG 24 hr tablet Take 1 tablet (30 mg total) by mouth daily.  30 tablet  5  . lansoprazole (PREVACID) 15 MG capsule Take 15 mg by mouth daily.      . metFORMIN (GLUCOPHAGE) 1000 MG tablet Take 1,000 mg by mouth 2 (two) times daily with a meal.       . metoprolol (LOPRESSOR) 50 MG tablet Take 50 mg by mouth daily.       . nitroGLYCERIN (NITROSTAT) 0.4 MG SL tablet Place 1 tablet (0.4 mg total) under the tongue every 5 (five) minutes as needed for chest pain.  25 tablet  6  . rosuvastatin (CRESTOR) 40 MG tablet Take 40 mg by mouth daily.      Marland Kitchen triamterene-hydrochlorothiazide (MAXZIDE-25) 37.5-25 MG per tablet Take 1 tablet by mouth daily.      . valsartan (DIOVAN) 160 MG tablet Take 1 tablet (160 mg total) by mouth 2 (two) times daily.  180 tablet  3   No current facility-administered medications for this visit.  Past Medical History  Diagnosis Date  . Irregular heart beat     atrial fibrillation  . CAD (coronary artery disease) 2004    multivessel, status post CABG x3  . Myocardial infarction 2002  . Peripheral vascular disease     S/P popliteal artery and renal artery stents  . Vaginal cancer 1983  . S/P CABG x 3 2004  . History of tobacco abuse   . Carotid artery disease     stent in 2013  . Diabetes   . Renovascular hypertension     Past Surgical History  Procedure Laterality Date  . Cardioversion  09/11/2011    Procedure: CARDIOVERSION;  Surgeon: Sanda Klein, MD;  Location: [facility] OR;  Service: Cardiovascular;  Laterality: N/A;  . Carotid stent  02/12/2012    Carotid Stent  Insertion, Carotid Angiogram - 9x40 Cordis Precise stent  (Dr. Pierre Bali)  . Transthoracic echocardiogram  07/2012    EF 55-65%, mild conc LVH, grade 1 diastolic dysfunction; calcicifed MV annulus, mild MR; LA mild-mod dilated; RA mod dilated; mod TR; PA peak pressure 71mHg, RVSP increased (pulm htn)  . Nm myocar perf wall motion  12/2010    persnatine - moderate perfusion defect in apical anterior/apical/basal inferolateral/mid inferolateral/apical lateral walls - EF 65% - low risk  . Cardiac catheterization  09/23/2002    3 vessel CAD >> CABG (Dr. RMarella Chimes  . Coronary artery bypass graft  09/24/2002    LIMA to LAD, SVG to Cfx, SVG to RCA (Dr. VPrescott Gum  . Cardiac catheterization  12/23/2003    patent LIMA, LAD, SVG to OM1, SVG to RCA, distal disease beyond graft insertion (Dr. RMarella Chimes  . Renal artery angioplasty  02/11/2003    transluminal angioplasty & stent wtih [id number] balloon expandable stent premounted on Cordis aviatory balloon (Dr. R.Charlyne Petrin . Carotid doppler  03/2013    right bulb & prox ICA w/50-69% stenosis, L ICA stent open/patent  . Renal doppler  09/2012    R prox renal artery @ stent equal/less that 60% diameter reduction; L renal artery 1-59% stenosis  . Lower extremity arterial doppler  09/2012    bilateral ABIS (mod arterial insuff at rest); R SFA 70-99% stenosis, L SFA 50-69% stenosis    Family History  Problem Relation Age of Onset  . Deep vein thrombosis Sister   . Diabetes Sister   . Heart disease Sister   . Hyperlipidemia Sister   . Hypertension Sister   . Diabetes Brother   . Hypertension Brother   . Hypertension Mother   . Hypertension Son     History   Social History  . Marital Status: Married    Spouse Name: N/A    Number of Children: 2  . Years of Education: 12   Occupational History  . Not on file.   Social History Main Topics  . Smoking status: Former Smoker    Quit date: 07/02/2001  . Smokeless tobacco: Not on file  .  Alcohol Use: Yes  . Drug Use: No  . Sexual Activity: Not on file   Other Topics Concern  . Not on file   Social History Narrative  . No narrative on file    Review of systems: The patient specifically denies any chest pain at rest or with exertion, dyspnea at rest or with exertion, orthopnea, paroxysmal nocturnal dyspnea, syncope, palpitations, focal neurological deficits, intermittent claudication, lower extremity edema, unexplained weight gain, cough, hemoptysis or wheezing.  The patient also denies  abdominal pain, nausea, vomiting, dysphagia, diarrhea, constipation, polyuria, polydipsia, dysuria, hematuria, frequency, urgency, abnormal bleeding or bruising, fever, chills, unexpected weight changes, mood swings, change in skin or hair texture, change in voice quality, auditory or visual problems, allergic reactions or rashes, new musculoskeletal complaints other than usual "aches and pains".   PHYSICAL EXAM BP 138/70  Pulse 70  Resp 16  Ht _0  (1.651 m)  Wt 200 lb (90.719 kg)  BMI 33.28 kg/m2 General: Alert, oriented x3, no distress  Head: no evidence of trauma, PERRL, EOMI, no exophtalmos or lid lag, no myxedema, no xanthelasma; normal ears, nose and oropharynx  Neck: normal jugular venous pulsations and no hepatojugular reflux; brisk carotid pulses without delay and no carotid bruits  Chest: clear to auscultation, no signs of consolidation by percussion or palpation, normal fremitus, symmetrical and full respiratory excursions  Cardiovascular: normal position and quality of the apical impulse, regular rhythm, normal first and second heart sounds, no rubs, positive S4 gallop, early peaking short systolic ejection murmur 1/6 at the aortic focus.  Abdomen: no tenderness or distention, no masses by palpation, no abnormal pulsatility or arterial bruits, normal bowel sounds, no hepatosplenomegaly  Extremities: no clubbing, cyanosis or edema; 2+ radial, ulnar and brachial pulses  bilaterally; 2+ right femoral, posterior tibial and dorsalis pedis pulses; 2+ left femoral, posterior tibial and dorsalis pedis pulses; no subclavian or femoral bruits  Neurological: grossly nonfocal   EKG: Normal sinus rhythm, incomplete right bundle-branch block (old), nonspecific T wave inversion V3 through V6   Lipid Panel     Component Value Date/Time   CHOL 101 11/30/2013 1014   TRIG 70 11/30/2013 1014   HDL 43 11/30/2013 1014   CHOLHDL 2.3 11/30/2013 1014   VLDL 14 11/30/2013 1014   LDLCALC 44 11/30/2013 1014    BMET    Component Value Date/Time   NA 138 11/30/2013 1014   K 3.7 11/30/2013 1014   CL 103 11/30/2013 1014   CO2 25 11/30/2013 1014   GLUCOSE 145* 11/30/2013 1014   BUN 24* 11/30/2013 1014   CREATININE 1.22* 11/30/2013 1014   CREATININE 0.72 02/13/2012 0430   CALCIUM 9.6 11/30/2013 1014   GFRNONAA >90 02/13/2012 0430   GFRAA >90 02/13/2012 0430     ASSESSMENT AND PLAN Occlusion and stenosis of carotid artery without mention of cerebral infarction  One year status post left carotid stenting. 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 There is also 50% stenosis in the M1 segment of the left middle cerebral artery   HTN (hypertension)  Control  PAF (paroxysmal atrial fibrillation)  No clinically symptomatic recurrence. She is on aspirin and clopidogrel for stroke prevention. Dr. Rollene Fare discontinued her warfarin in December of 2013 due to concerns regarding bleeding risk while on "triple therapy". We'll continue dual antiplatelet therapy for now, but if clopidogrel is to be stoped in the future , she should start taking warfarin or equivalent novel anticoagulant.   PVD (peripheral vascular disease)  Currently asymptomatic. Last duplex ultrasound performed in April 2015 showed bilateral ABI of 0.6. There were stenoses bilaterally in the mid to distal superficial femoral artery,  right greater than left there was bilateral 3 vessel runoff. Mild pain left > right. Walks at a gym once a week and has to sit down after 10 laps. The pain does not prevent her from doing any activity. Continue to monitor, for  for revascularization if she develops claudication. Right now I don't think her symptoms are vascular in etiology.   Right renal artery stenosis s/p stent  Minimally abnormal renal function  Diabetes mellitus, Type 2 NIDDM   CAD, CABG X 3 2004, distal LAD diffuse disease, Myoview anterior wall ischemia 11/2013 Seems best suited for medical therapy at this time no good revascularization options for the anterior wall  Meds ordered this encounter  Medications  . DISCONTD: triamcinolone cream (KENALOG) 0.1 %    Sig:     Bisma Klett  Sanda Klein, MD, [facility] [phone] office [phone] pager

## 2014-02-15 NOTE — Patient Instructions (Signed)
Your physician recommends that you schedule a follow-up appointment in: 1 Year  

## 2014-02-19 ENCOUNTER — Encounter (HOSPITAL_COMMUNITY): Payer: Self-pay | Admitting: Emergency Medicine

## 2014-02-19 ENCOUNTER — Emergency Department (HOSPITAL_COMMUNITY)
Admission: EM | Admit: 2014-02-19 | Discharge: 2014-02-19 | Disposition: A | Discharge status: Home / Self Care | Payer: Medicare PPO | Attending: Emergency Medicine | Admitting: Emergency Medicine

## 2014-02-19 DIAGNOSIS — Z8544 Personal history of malignant neoplasm of other female genital organs: Secondary | ICD-10-CM | POA: Insufficient documentation

## 2014-02-19 DIAGNOSIS — Y92009 Unspecified place in unspecified non-institutional (private) residence as the place of occurrence of the external cause: Secondary | ICD-10-CM | POA: Diagnosis not present

## 2014-02-19 DIAGNOSIS — Y9389 Activity, other specified: Secondary | ICD-10-CM | POA: Insufficient documentation

## 2014-02-19 DIAGNOSIS — Z9889 Other specified postprocedural states: Secondary | ICD-10-CM | POA: Diagnosis not present

## 2014-02-19 DIAGNOSIS — E119 Type 2 diabetes mellitus without complications: Secondary | ICD-10-CM | POA: Insufficient documentation

## 2014-02-19 DIAGNOSIS — S61209A Unspecified open wound of unspecified finger without damage to nail, initial encounter: Secondary | ICD-10-CM | POA: Diagnosis present

## 2014-02-19 DIAGNOSIS — I2510 Atherosclerotic heart disease of native coronary artery without angina pectoris: Secondary | ICD-10-CM | POA: Diagnosis not present

## 2014-02-19 DIAGNOSIS — Z79899 Other long term (current) drug therapy: Secondary | ICD-10-CM | POA: Insufficient documentation

## 2014-02-19 DIAGNOSIS — Z951 Presence of aortocoronary bypass graft: Secondary | ICD-10-CM | POA: Diagnosis not present

## 2014-02-19 DIAGNOSIS — I252 Old myocardial infarction: Secondary | ICD-10-CM | POA: Diagnosis not present

## 2014-02-19 DIAGNOSIS — Z87891 Personal history of nicotine dependence: Secondary | ICD-10-CM | POA: Insufficient documentation

## 2014-02-19 DIAGNOSIS — Z9861 Coronary angioplasty status: Secondary | ICD-10-CM | POA: Insufficient documentation

## 2014-02-19 DIAGNOSIS — W268XXA Contact with other sharp object(s), not elsewhere classified, initial encounter: Secondary | ICD-10-CM | POA: Insufficient documentation

## 2014-02-19 DIAGNOSIS — Z7982 Long term (current) use of aspirin: Secondary | ICD-10-CM | POA: Insufficient documentation

## 2014-02-19 DIAGNOSIS — S61217A Laceration without foreign body of left little finger without damage to nail, initial encounter: Secondary | ICD-10-CM

## 2014-02-19 NOTE — ED Notes (Signed)
Pt discharged home with all belongings, pt alert, oriented and ambulatory upon discharge. No new RX prescribed, pt verbalizes understanding of discharge instructions, pt drove self home, no narcotics given to pt

## 2014-02-19 NOTE — ED Provider Notes (Signed)
CSN: [id number]     Arrival date & time 02/19/14  1829 History   This chart was scribed for a non-physician practitioner, Deborah Chalk, NP-C, working with Babette Relic, MD by Martinique Peace, ED Scribe. The patient was seen in [facility]. The patient's care was started at 7:28 PM.    Chief Complaint  Patient presents with  . Laceration      Patient is a 63 y.o. female presenting with skin laceration. The history is provided by the patient. No language interpreter was used.  Laceration Location:  Hand Hand laceration location:  R finger Bleeding: controlled   Time since incident:  1 hour Laceration mechanism:  Broken glass Tetanus status:  Up to date  HPI Comments: [name] is a 63 y.o. female who presents to the Emergency Department complaining of laceration to the palmar aspect of her right 5th MP joint onset 6:00 PM. Pt states that she swiped her hand while laughing and cut her hand on a piece of glass protruding from a windmill she has in her house. Pt reports history of bleeding easily and being on Coumadin. She goes on to state that she believes she has received a Tetanus shot in the last 10 years. Pt is former smoker (Quit date: 07/2001).   Past Medical History  Diagnosis Date  . Irregular heart beat     atrial fibrillation  . CAD (coronary artery disease) 2004    multivessel, status post CABG x3  . Myocardial infarction 2002  . Peripheral vascular disease     S/P popliteal artery and renal artery stents  . Vaginal cancer 1983  . S/P CABG x 3 2004  . History of tobacco abuse   . Carotid artery disease     stent in 2013  . Diabetes   . Renovascular hypertension    Past Surgical History  Procedure Laterality Date  . Cardioversion  09/11/2011    Procedure: CARDIOVERSION;  Surgeon: Sanda Klein, MD;  Location: [facility] OR;  Service: Cardiovascular;  Laterality: N/A;  . Carotid stent  02/12/2012    Carotid Stent Insertion, Carotid Angiogram - 9x40 Cordis Precise stent   (Dr. Pierre Bali)  . Transthoracic echocardiogram  07/2012    EF 55-65%, mild conc LVH, grade 1 diastolic dysfunction; calcicifed MV annulus, mild MR; LA mild-mod dilated; RA mod dilated; mod TR; PA peak pressure 51mmHg, RVSP increased (pulm htn)  . Nm myocar perf wall motion  12/2010    persnatine - moderate perfusion defect in apical anterior/apical/basal inferolateral/mid inferolateral/apical lateral walls - EF 65% - low risk  . Cardiac catheterization  09/23/2002    3 vessel CAD >> CABG (Dr. Marella Chimes)  . Coronary artery bypass graft  09/24/2002    LIMA to LAD, SVG to Cfx, SVG to RCA (Dr. Prescott Gum)  . Cardiac catheterization  12/23/2003    patent LIMA, LAD, SVG to OM1, SVG to RCA, distal disease beyond graft insertion (Dr. Marella Chimes)  . Renal artery angioplasty  02/11/2003    transluminal angioplasty & stent wtih 4x40mm Genesis balloon expandable stent premounted on Cordis aviatory balloon (Dr. Charlyne Petrin)  . Carotid doppler  03/2013    right bulb & prox ICA w/50-69% stenosis, L ICA stent open/patent  . Renal doppler  09/2012    R prox renal artery @ stent equal/less that 60% diameter reduction; L renal artery 1-59% stenosis  . Lower extremity arterial doppler  09/2012    bilateral ABIS (mod arterial insuff at rest); R  SFA 70-99% stenosis, L SFA 50-69% stenosis   Family History  Problem Relation Age of Onset  . Deep vein thrombosis Sister   . Diabetes Sister   . Heart disease Sister   . Hyperlipidemia Sister   . Hypertension Sister   . Diabetes Brother   . Hypertension Brother   . Hypertension Mother   . Hypertension Son    History  Substance Use Topics  . Smoking status: Former Smoker    Quit date: 07/02/2001  . Smokeless tobacco: Not on file  . Alcohol Use: Yes   OB History   Grav Para Term Preterm Abortions TAB SAB Ect Mult Living                 Review of Systems  Constitutional: Negative for fever and chills.  Skin: Positive for wound.       Laceration to palmar  aspect of her right 5th MP joint.   Hematological: Bruises/bleeds easily.  All other systems reviewed and are negative.     Allergies  Review of patient's allergies indicates no known allergies.  Home Medications   Prior to Admission medications   Medication Sig Start Date End Date Taking? Authorizing Provider  amLODipine (NORVASC) 10 MG tablet Take 1 tablet (10 mg total) by mouth daily. 02/15/14   Mihai Croitoru, MD  aspirin 81 MG tablet Take 81 mg by mouth daily.    Historical Provider, MD  clopidogrel (PLAVIX) 75 MG tablet Take 1 tablet (75 mg total) by mouth daily. 02/15/14   Mihai Croitoru, MD  isosorbide mononitrate (IMDUR) 30 MG 24 hr tablet Take 1 tablet (30 mg total) by mouth daily. 12/18/13   Leonie Man, MD  lansoprazole (PREVACID) 15 MG capsule Take 15 mg by mouth daily.    Historical Provider, MD  metFORMIN (GLUCOPHAGE) 1000 MG tablet Take 1,000 mg by mouth 2 (two) times daily with a meal.     Historical Provider, MD  metoprolol (LOPRESSOR) 50 MG tablet Take 50 mg by mouth daily.  02/05/12   Historical Provider, MD  nitroGLYCERIN (NITROSTAT) 0.4 MG SL tablet Place 1 tablet (0.4 mg total) under the tongue every 5 (five) minutes as needed for chest pain. 12/17/13   Leonie Man, MD  rosuvastatin (CRESTOR) 40 MG tablet Take 40 mg by mouth daily.    Historical Provider, MD  triamterene-hydrochlorothiazide (MAXZIDE-25) 37.5-25 MG per tablet Take 1 tablet by mouth daily. 02/15/14   Mihai Croitoru, MD  valsartan (DIOVAN) 160 MG tablet Take 1 tablet (160 mg total) by mouth 2 (two) times daily. 12/23/13   Leonie Man, MD   BP 138/101  Pulse 69  Temp(Src) 98.3 F (36.8 C) (Oral)  Resp 18  SpO2 99% Physical Exam  Nursing note and vitals reviewed. Constitutional: She is oriented to person, place, and time. She appears well-developed and well-nourished. No distress.  HENT:  Head: Normocephalic and atraumatic.  Eyes: Conjunctivae and EOM are normal.  Neck: Neck supple. No  tracheal deviation present.  Cardiovascular: Normal rate.   Pulmonary/Chest: Effort normal. No respiratory distress.  Musculoskeletal: Normal range of motion.  Neurological: She is alert and oriented to person, place, and time.  Skin: Skin is warm and dry.  Laceration to palmar aspect of her right 5th MP joint.   Psychiatric: She has a normal mood and affect. Her behavior is normal.    ED Course  Procedures (including critical care time) LACERATION REPAIR Performed by: Norman Herrlich Authorized by: Norman Herrlich Consent: Verbal consent  obtained. Risks and benefits: risks, benefits and alternatives were discussed Consent given by: patient Patient identity confirmed: provided demographic data Prepped and Draped in normal sterile fashion Wound explored  Laceration Location: palmer aspect right 5th MP jooint  Laceration Length: 0.6 cm  No Foreign Bodies seen or palpated  Anesthesia: local infiltration  Local anesthetic: lidocaine 2%  Anesthetic total: 1.5 ml  Irrigation method: syringe  Amount of cleaning: standard  Skin closure: 5.0 prolene  Number of sutures: 2  Technique: simple interrupted  Patient tolerance: Patient tolerated the procedure well with no immediate complications.  Labs Review Labs Reviewed - No data to display  Results for orders placed in visit on 11/20/13  COMPREHENSIVE METABOLIC PANEL      Result Value Ref Range   Sodium 138  135 - 145 mEq/L   Potassium 3.7  3.5 - 5.3 mEq/L   Chloride 103  96 - 112 mEq/L   CO2 25  19 - 32 mEq/L   Glucose, Bld 145 (*) 70 - 99 mg/dL   BUN 24 (*) 6 - 23 mg/dL   Creat 1.22 (*) 0.50 - 1.10 mg/dL   Total Bilirubin 0.5  0.2 - 1.2 mg/dL   Alkaline Phosphatase 65  39 - 117 U/L   AST 14  0 - 37 U/L   ALT 10  0 - 35 U/L   Total Protein 6.9  6.0 - 8.3 g/dL   Albumin 4.2  3.5 - 5.2 g/dL   Calcium 9.6  8.4 - 10.5 mg/dL  LIPID PANEL      Result Value Ref Range   Cholesterol 101  0 - 200 mg/dL    Triglycerides 70  <150 mg/dL   HDL 43  >39 mg/dL   Total CHOL/HDL Ratio 2.3     VLDL 14  0 - 40 mg/dL   LDL Cholesterol 44  0 - 99 mg/dL  HEMOGLOBIN A1C      Result Value Ref Range   Hemoglobin A1C 7.8 (*) <5.7 %   Mean Plasma Glucose 177 (*) <117 mg/dL   No results found.    Imaging Review No results found.   EKG Interpretation None     Medications - No data to display  7:33 PM- Treatment plan was discussed with patient who verbalizes understanding and agrees which includes numbing injection and sutures.     MDM  Patient states she injured her finger on a piece of broken glass.  No foreign body palpated or visualized. Wound cleaned.  Sutured closure. Patient states she thinks her tetanus is up to date, but will confirm with her PCP.  Patient advised to monitor for signs of infection.  Suture removal in 5-7 days, Final diagnoses:  None    Laceration of right little finger.   I personally performed the services described in this documentation, which was scribed in my presence. The recorded information has been reviewed and is accurate.    Norman Herrlich, NP 02/19/14 [phone]

## 2014-02-19 NOTE — Discharge Instructions (Signed)

## 2014-02-19 NOTE — ED Notes (Signed)
Pt with laceration to 5th digit on right hand with piece of glass; bleeding controlled at present

## 2014-03-02 NOTE — ED Provider Notes (Signed)
Medical screening examination/treatment/procedure(s) were performed by non-physician practitioner and as supervising physician I was immediately available for consultation/collaboration.   EKG Interpretation None       Babette Relic, MD 03/02/14 [phone]

## 2014-04-23 ENCOUNTER — Other Ambulatory Visit: Payer: Self-pay | Admitting: *Deleted

## 2014-04-23 MED ORDER — METOPROLOL TARTRATE 50 MG PO TABS: 50.0000 mg | ORAL_TABLET | Freq: Every day | ORAL | Status: DC

## 2014-04-23 NOTE — Telephone Encounter (Signed)
Refilled electronically 

## 2014-05-14 ENCOUNTER — Other Ambulatory Visit: Payer: Self-pay | Admitting: *Deleted

## 2014-05-14 MED ORDER — ROSUVASTATIN CALCIUM 40 MG PO TABS: 40.0000 mg | ORAL_TABLET | Freq: Every day | ORAL | Status: DC

## 2014-05-14 NOTE — Telephone Encounter (Signed)
Refilled electronically 

## 2014-05-17 ENCOUNTER — Encounter: Payer: Self-pay | Admitting: Internal Medicine

## 2014-05-18 ENCOUNTER — Telehealth: Payer: Self-pay

## 2014-05-18 NOTE — Telephone Encounter (Signed)
Called patient but there is no voice mail. Pt had a fax refill request from [facility] for Crestor. A script was already sent to [facility]. I wanted to know does it go to [facility] or [facility].

## 2014-05-19 ENCOUNTER — Other Ambulatory Visit: Payer: Self-pay

## 2014-05-19 ENCOUNTER — Telehealth: Payer: Self-pay

## 2014-05-19 MED ORDER — ROSUVASTATIN CALCIUM 40 MG PO TABS: 40.0000 mg | ORAL_TABLET | Freq: Every day | ORAL | Status: DC

## 2014-05-19 NOTE — Telephone Encounter (Signed)
Rx sent to pharmacy   

## 2014-05-19 NOTE — Telephone Encounter (Signed)
Pt returned my call regarding medication. She wanted crestor to be sent to [facility] because it is cheaper. I called [facility] to cancel the prescription and sent to [facility].

## 2014-06-10 ENCOUNTER — Encounter (HOSPITAL_COMMUNITY): Payer: Self-pay | Admitting: Surgery

## 2014-09-20 ENCOUNTER — Other Ambulatory Visit: Payer: Self-pay | Admitting: Cardiovascular Disease

## 2014-09-20 MED ORDER — ISOSORBIDE MONONITRATE ER 30 MG PO TB24: 30.0000 mg | ORAL_TABLET | Freq: Every day | ORAL | Status: DC

## 2014-09-20 NOTE — Telephone Encounter (Signed)
°  1. Which medications need to be refilled? Isosorbide   2. Which pharmacy is medication to be sent to?[facility] on [address]  3. Do they need a 30 day or 90 day supply? 30  4. Would they like a call back once the medication has been sent to the pharmacy? yes

## 2014-09-20 NOTE — Telephone Encounter (Signed)
Rx refill sent to patient pharmacy   

## 2014-10-13 ENCOUNTER — Encounter (HOSPITAL_COMMUNITY): Payer: Medicare PPO

## 2014-10-14 ENCOUNTER — Other Ambulatory Visit (HOSPITAL_COMMUNITY): Payer: Self-pay | Admitting: Cardiology

## 2014-10-14 ENCOUNTER — Ambulatory Visit (HOSPITAL_COMMUNITY)
Admission: RE | Admit: 2014-10-14 | Discharge: 2014-10-14 | Disposition: A | Discharge status: Home / Self Care | Payer: Medicare PPO | Source: Ambulatory Visit | Attending: Cardiovascular Disease | Admitting: Cardiovascular Disease

## 2014-10-14 DIAGNOSIS — I739 Peripheral vascular disease, unspecified: Secondary | ICD-10-CM

## 2014-10-14 NOTE — Progress Notes (Signed)
Lower extremity arterial duplex completed. °Brianna L Mazza,RVT °

## 2014-10-26 ENCOUNTER — Ambulatory Visit (INDEPENDENT_AMBULATORY_CARE_PROVIDER_SITE_OTHER): Payer: Medicare PPO | Admitting: Physician Assistant

## 2014-10-26 VITALS — BP 124/72 | HR 99 | Temp 98.0°F | Resp 18 | Ht 65.0 in | Wt 187.0 lb

## 2014-10-26 DIAGNOSIS — M62830 Muscle spasm of back: Secondary | ICD-10-CM

## 2014-10-26 MED ORDER — CYCLOBENZAPRINE HCL 5 MG PO TABS: 5.0000 mg | ORAL_TABLET | Freq: Two times a day (BID) | ORAL | Status: DC | PRN

## 2014-10-26 NOTE — Progress Notes (Signed)
Subjective:    Patient ID: [name], female    DOB: 31-Aug-1950, 64 y.o.   MRN: [id number]  Chief Complaint  Patient presents with  . Flank Pain    rt side pain since sunday    Patient Active Problem List   Diagnosis Date Noted  . Abnormal nuclear stress test 12/18/2013  . Right renal artery stenosis s/p stent 11/23/2013  . Bradycardia 02/13/2012  . PAF (paroxysmal atrial fibrillation) 02/13/2012  . Hypotension 02/13/2012  . PVD (peripheral vascular disease) 02/13/2012  . CAD, CABG X 3 2004, distal LAD/OM disease 2005, Myoview low risk 7/12 02/13/2012  . Diabetes mellitus, Type 2 NIDDM 02/13/2012  . HTN (hypertension) 02/13/2012  . Dyslipidemia 02/13/2012  . Chronic anticoagulation 02/13/2012  . Jaw pain 02/13/2012  . Occlusion and stenosis of carotid artery without mention of cerebral infarction 01/28/2012   Prior to Admission medications   Medication Sig Start Date End Date Taking? Authorizing Provider  amLODipine (NORVASC) 10 MG tablet Take 1 tablet (10 mg total) by mouth daily. 02/15/14  Yes Mihai Croitoru, MD  aspirin 81 MG tablet Take 81 mg by mouth daily.   Yes Historical Provider, MD  clopidogrel (PLAVIX) 75 MG tablet Take 1 tablet (75 mg total) by mouth daily. 02/15/14  Yes Mihai Croitoru, MD  isosorbide mononitrate (IMDUR) 30 MG 24 hr tablet Take 1 tablet (30 mg total) by mouth daily. 09/20/14  Yes Leonie Man, MD  lansoprazole (PREVACID) 15 MG capsule Take 15 mg by mouth daily.   Yes Historical Provider, MD  metFORMIN (GLUCOPHAGE) 1000 MG tablet Take 1,000 mg by mouth 2 (two) times daily with a meal.    Yes Historical Provider, MD  metoprolol (LOPRESSOR) 50 MG tablet Take 1 tablet (50 mg total) by mouth daily. 04/23/14  Yes Mihai Croitoru, MD  nitroGLYCERIN (NITROSTAT) 0.4 MG SL tablet Place 1 tablet (0.4 mg total) under the tongue every 5 (five) minutes as needed for chest pain. 12/17/13  Yes Leonie Man, MD  rosuvastatin (CRESTOR) 40 MG tablet Take 1  tablet (40 mg total) by mouth daily. 05/19/14  Yes Mihai Croitoru, MD  triamterene-hydrochlorothiazide (MAXZIDE-25) 37.5-25 MG per tablet Take 1 tablet by mouth daily. 02/15/14  Yes Mihai Croitoru, MD  valsartan (DIOVAN) 160 MG tablet Take 1 tablet (160 mg total) by mouth 2 (two) times daily. 12/23/13  Yes Leonie Man, MD  cyclobenzaprine (FLEXERIL) 5 MG tablet Take 1 tablet (5 mg total) by mouth 2 (two) times daily as needed for muscle spasms. 10/26/14   Julieta Gutting, PA   Medications, allergies, past medical history, surgical history, family history, social history and problem list reviewed and updated.  HPI  64 yof with pmh cad, cabg presents with right low back pain past few days.   Pain started while bending over sorting clothes 3 days ago. Has been fairly persistent since that time. Described as moderate. No radiation. Located in right low back. No sciatica. Denies dysuria, hematuria, urinary freq/urgency, fevers, chills. Denies abd pain, n/v, diarrhea. Denies cp, sob.   Last EF 55-65%.   Has tried heating pad with no relief. Ibuprofen prn with no relief.   Review of Systems See HPI.     Objective:   Physical Exam  Constitutional: She appears well-developed and well-nourished.  Non-toxic appearance. She does not have a sickly appearance. She does not appear ill. No distress.  BP 124/72 mmHg  Pulse 99  Temp(Src) 98 F (36.7 C) (Oral)  Resp 18  Ht 5\' 5"  (1.651 m)  Wt 187 lb (84.823 kg)  BMI 31.12 kg/m2  SpO2 98%   Abdominal: Soft. Normal appearance and bowel sounds are normal. There is no tenderness. There is no CVA tenderness.  Musculoskeletal:       Thoracic back: She exhibits no tenderness and no bony tenderness.       Lumbar back: She exhibits decreased range of motion, tenderness and spasm. She exhibits no bony tenderness.       Back:  Small right low back spasm. Mildly decreased rom due to discomfort. Normal strength, sensation RLE. Normal cap refill.         Assessment & Plan:   64 yof with pmh cad, cabg presents with right low back pain past few days.   Back muscle spasm - Plan: cyclobenzaprine (FLEXERIL) 5 MG tablet --doubt pyelo/stone with no cva ttp, no urinary sx, no fevers, chills, n/v --pain likely from spasm over area --> heat, massage, light rom, flexeril 5 mg prn --pt instructed to use caution with possible drowsiness with med  Julieta Gutting, PA-C Physician Assistant-Certified [facility]  10/26/2014 1:54 PM

## 2014-10-26 NOTE — Patient Instructions (Signed)
You have a muscle spasm over your right lower back.  Heat, massage, tylenol as needed for pain will help. Taking the flexeril as needed will help. Start with 1-2 times per day as needed. This medication can make you sleepy so be sure to use caution with this.   Muscle Cramps and Spasms Muscle cramps and spasms occur when a muscle or muscles tighten and you have no control over this tightening (involuntary muscle contraction). They are a common problem and can develop in any muscle. The most common place is in the calf muscles of the leg. Both muscle cramps and muscle spasms are involuntary muscle contractions, but they also have differences:   Muscle cramps are sporadic and painful. They may last a few seconds to a quarter of an hour. Muscle cramps are often more forceful and last longer than muscle spasms.  Muscle spasms may or may not be painful. They may also last just a few seconds or much longer. CAUSES  It is uncommon for cramps or spasms to be due to a serious underlying problem. In many cases, the cause of cramps or spasms is unknown. Some common causes are:   Overexertion.   Overuse from repetitive motions (doing the same thing over and over).   Remaining in a certain position for a long period of time.   Improper preparation, form, or technique while performing a sport or activity.   Dehydration.   Injury.   Side effects of some medicines.   Abnormally low levels of the salts and ions in your blood (electrolytes), especially potassium and calcium. This could happen if you are taking water pills (diuretics) or you are pregnant.  Some underlying medical problems can make it more likely to develop cramps or spasms. These include, but are not limited to:   Diabetes.   Parkinson disease.   Hormone disorders, such as thyroid problems.   Alcohol abuse.   Diseases specific to muscles, joints, and bones.   Blood vessel disease where not enough blood is getting to  the muscles.  HOME CARE INSTRUCTIONS   Stay well hydrated. Drink enough water and fluids to keep your urine clear or pale yellow.  It may be helpful to massage, stretch, and relax the affected muscle.  For tight or tense muscles, use a warm towel, heating pad, or hot shower water directed to the affected area.  If you are sore or have pain after a cramp or spasm, applying ice to the affected area may relieve discomfort.  Put ice in a plastic bag.  Place a towel between your skin and the bag.  Leave the ice on for 15-20 minutes, 03-04 times a day.  Medicines used to treat a known cause of cramps or spasms may help reduce their frequency or severity. Only take over-the-counter or prescription medicines as directed by your caregiver. SEEK MEDICAL CARE IF:  Your cramps or spasms get more severe, more frequent, or do not improve over time.  MAKE SURE YOU:   Understand these instructions.  Will watch your condition.  Will get help right away if you are not doing well or get worse. Document Released: 12/08/2001 Document Revised: 10/13/2012 Document Reviewed: 06/04/2012 [facility] Patient Information 2015 [place], [place]. This information is not intended to replace advice given to you by your health care provider. Make sure you discuss any questions you have with your health care provider.

## 2014-12-28 DIAGNOSIS — E119 Type 2 diabetes mellitus without complications: Secondary | ICD-10-CM | POA: Diagnosis not present

## 2014-12-28 DIAGNOSIS — Z87891 Personal history of nicotine dependence: Secondary | ICD-10-CM | POA: Diagnosis not present

## 2014-12-28 DIAGNOSIS — I10 Essential (primary) hypertension: Secondary | ICD-10-CM | POA: Diagnosis not present

## 2014-12-28 DIAGNOSIS — H269 Unspecified cataract: Secondary | ICD-10-CM | POA: Diagnosis not present

## 2014-12-28 DIAGNOSIS — Z951 Presence of aortocoronary bypass graft: Secondary | ICD-10-CM | POA: Diagnosis not present

## 2014-12-28 DIAGNOSIS — K219 Gastro-esophageal reflux disease without esophagitis: Secondary | ICD-10-CM | POA: Diagnosis not present

## 2014-12-28 DIAGNOSIS — E785 Hyperlipidemia, unspecified: Secondary | ICD-10-CM | POA: Diagnosis not present

## 2014-12-28 DIAGNOSIS — Z6832 Body mass index (BMI) 32.0-32.9, adult: Secondary | ICD-10-CM | POA: Diagnosis not present

## 2014-12-28 DIAGNOSIS — I252 Old myocardial infarction: Secondary | ICD-10-CM | POA: Diagnosis not present

## 2015-01-10 DIAGNOSIS — H2511 Age-related nuclear cataract, right eye: Secondary | ICD-10-CM | POA: Diagnosis not present

## 2015-01-10 DIAGNOSIS — H43811 Vitreous degeneration, right eye: Secondary | ICD-10-CM | POA: Diagnosis not present

## 2015-01-10 DIAGNOSIS — E119 Type 2 diabetes mellitus without complications: Secondary | ICD-10-CM | POA: Diagnosis not present

## 2015-02-03 DIAGNOSIS — E119 Type 2 diabetes mellitus without complications: Secondary | ICD-10-CM | POA: Diagnosis not present

## 2015-02-03 DIAGNOSIS — I10 Essential (primary) hypertension: Secondary | ICD-10-CM | POA: Diagnosis not present

## 2015-02-03 DIAGNOSIS — E0590 Thyrotoxicosis, unspecified without thyrotoxic crisis or storm: Secondary | ICD-10-CM | POA: Diagnosis not present

## 2015-02-03 DIAGNOSIS — I2510 Atherosclerotic heart disease of native coronary artery without angina pectoris: Secondary | ICD-10-CM | POA: Diagnosis not present

## 2015-02-03 DIAGNOSIS — E785 Hyperlipidemia, unspecified: Secondary | ICD-10-CM | POA: Diagnosis not present

## 2015-02-10 DIAGNOSIS — E0590 Thyrotoxicosis, unspecified without thyrotoxic crisis or storm: Secondary | ICD-10-CM | POA: Diagnosis not present

## 2015-02-10 DIAGNOSIS — K219 Gastro-esophageal reflux disease without esophagitis: Secondary | ICD-10-CM | POA: Diagnosis not present

## 2015-02-10 DIAGNOSIS — Z1389 Encounter for screening for other disorder: Secondary | ICD-10-CM | POA: Diagnosis not present

## 2015-02-10 DIAGNOSIS — I10 Essential (primary) hypertension: Secondary | ICD-10-CM | POA: Diagnosis not present

## 2015-02-10 DIAGNOSIS — I480 Paroxysmal atrial fibrillation: Secondary | ICD-10-CM | POA: Diagnosis not present

## 2015-02-10 DIAGNOSIS — I129 Hypertensive chronic kidney disease with stage 1 through stage 4 chronic kidney disease, or unspecified chronic kidney disease: Secondary | ICD-10-CM | POA: Diagnosis not present

## 2015-02-10 DIAGNOSIS — I2510 Atherosclerotic heart disease of native coronary artery without angina pectoris: Secondary | ICD-10-CM | POA: Diagnosis not present

## 2015-02-10 DIAGNOSIS — E1159 Type 2 diabetes mellitus with other circulatory complications: Secondary | ICD-10-CM | POA: Diagnosis not present

## 2015-02-10 DIAGNOSIS — I872 Venous insufficiency (chronic) (peripheral): Secondary | ICD-10-CM | POA: Diagnosis not present

## 2015-02-15 DIAGNOSIS — Z1212 Encounter for screening for malignant neoplasm of rectum: Secondary | ICD-10-CM | POA: Diagnosis not present

## 2015-02-25 ENCOUNTER — Ambulatory Visit: Payer: Medicare PPO | Admitting: Cardiovascular Disease

## 2015-03-03 ENCOUNTER — Other Ambulatory Visit: Payer: Self-pay | Admitting: *Deleted

## 2015-03-03 ENCOUNTER — Ambulatory Visit (INDEPENDENT_AMBULATORY_CARE_PROVIDER_SITE_OTHER): Payer: Medicare PPO | Admitting: Cardiovascular Disease

## 2015-03-03 ENCOUNTER — Encounter: Payer: Self-pay | Admitting: Cardiovascular Disease

## 2015-03-03 VITALS — BP 134/66 | HR 65 | Resp 16 | Ht 63.0 in | Wt 189.0 lb

## 2015-03-03 DIAGNOSIS — I6522 Occlusion and stenosis of left carotid artery: Secondary | ICD-10-CM | POA: Diagnosis not present

## 2015-03-03 DIAGNOSIS — I701 Atherosclerosis of renal artery: Secondary | ICD-10-CM | POA: Diagnosis not present

## 2015-03-03 DIAGNOSIS — I25719 Atherosclerosis of autologous vein coronary artery bypass graft(s) with unspecified angina pectoris: Secondary | ICD-10-CM | POA: Diagnosis not present

## 2015-03-03 DIAGNOSIS — I480 Paroxysmal atrial fibrillation: Secondary | ICD-10-CM

## 2015-03-03 MED ORDER — NITROGLYCERIN 0.4 MG SL SUBL: 0.4000 mg | SUBLINGUAL_TABLET | SUBLINGUAL | Status: AC | PRN

## 2015-03-03 MED ORDER — ISOSORBIDE MONONITRATE ER 30 MG PO TB24: 30.0000 mg | ORAL_TABLET | Freq: Every day | ORAL | Status: DC

## 2015-03-03 MED ORDER — AMLODIPINE BESYLATE 10 MG PO TABS: 10.0000 mg | ORAL_TABLET | Freq: Every day | ORAL | Status: DC

## 2015-03-03 MED ORDER — APIXABAN 5 MG PO TABS: 5.0000 mg | ORAL_TABLET | Freq: Two times a day (BID) | ORAL | Status: DC

## 2015-03-03 MED ORDER — LANSOPRAZOLE 15 MG PO CPDR: 15.0000 mg | DELAYED_RELEASE_CAPSULE | Freq: Every day | ORAL | Status: AC

## 2015-03-03 MED ORDER — METFORMIN HCL 1000 MG PO TABS: 1000.0000 mg | ORAL_TABLET | Freq: Two times a day (BID) | ORAL | Status: AC

## 2015-03-03 MED ORDER — TRIAMTERENE-HCTZ 37.5-25 MG PO TABS: 1.0000 | ORAL_TABLET | Freq: Every day | ORAL | Status: DC

## 2015-03-03 MED ORDER — VALSARTAN 160 MG PO TABS: 160.0000 mg | ORAL_TABLET | Freq: Two times a day (BID) | ORAL | Status: DC

## 2015-03-03 NOTE — Progress Notes (Signed)
Patient ID: [name], female   DOB: 1950/10/29, 64 y.o.   MRN: [id number]     Cardiology Office Note   Date:  03/03/2015   ID:  [name], DOB 1950/09/09, MRN [id number]  PCP:  Tivis Ringer, MD  Cardiologist:   Sanda Klein, MD   Chief Complaint  Patient presents with  . Annual Exam    Patient has no complaints.      History of Present Illness: [name] is a 64 y.o. female who presents for  Follow-up for recurrent paroxysmal atrial fibrillation, coronary artery disease status post previous bypass surgery , peripheral arterial disease involving the lower extremities and renal arteries , carotid stenosis status post left carotid stent, hypertension, hypercholesterolemia.  She feels great and has no cardiovascular complaints, specifically denying chest pain or shortness of breath either at rest or with exertion, focal neurological deficits, palpitations, lower extremity edema or intermittent claudication.   She is in atrial fibrillation today, with controlled ventricular rate. She is completely unaware of the arrhythmia. She reports that when she was visited by a [facility] last May she was told that her heart rate was irregular.   apparently she had a screening fecal occult bleeding test that was abnormal and she is due to return in 3 more Hemoccult cards to her primary care physician's office tomorrow. She does not have anemia. She has never had a colonoscopy.   She had CABG X 3 with LIMA-LAD, SVG-OM,SVG-RCA 2004. Re studied in 2005 and this showed patent grafts, with distal LAD and OM disease. Her Myoview in 7/12 was low risk, but repeat in 11/2013 showed new anterolateral ischemia.  She has no good revascularization options in the anterior wall. Although the LIMA anastomosis is patent there is no retrograde flow to the proximal LAD and the distal LAD is diffusely diseased and not amenable to PCI. Her most recent echocardiogram in January 2014  showed normal left ventricular systolic function and regional wall motion. Echocardiogram in January 2014 showed normal left ventricular systolic function and wall motion with an estimated systolic PA pressure of 44 mm Hg  She had AF in Jan 2013. In March of 2013 she had DCCV, which is when I first met her.  She was in normal sinus rhythm in August 2015 in the clinic. She has PAD and has had prior renal and popliteal stenting.  By Duplex US April 2015, bilateral ABI 0.60, 70-99% stenosis of right SFA, 50-69% on left.  Last renal artery scan showed less than 60% stenosis bilaterally, with a widely patent stent on the right side.  She had left carotid stenting 02/12/12 and post op she was hypotensive and bradycardic.  She has hyperlipidemia, diabetes mellitus and hypertension. Her most recent serum creatinine in June 2015 was 1.2.   Past Medical History  Diagnosis Date  . Irregular heart beat     atrial fibrillation  . CAD (coronary artery disease) 2004    multivessel, status post CABG x3  . Myocardial infarction 2002  . Peripheral vascular disease     S/P popliteal artery and renal artery stents  . Vaginal cancer 1983  . S/P CABG x 3 2004  . History of tobacco abuse   . Carotid artery disease     stent in 2013  . Diabetes   . Renovascular hypertension     Past Surgical History  Procedure Laterality Date  . Cardioversion  09/11/2011    Procedure: CARDIOVERSION;  Surgeon: Sanda Klein, MD;  Location: [facility] OR;  Service: Cardiovascular;  Laterality: N/A;  . Carotid stent  02/12/2012    Carotid Stent Insertion, Carotid Angiogram - 9x40 Cordis Precise stent  (Dr. Pierre Bali)  . Transthoracic echocardiogram  07/2012    EF 55-65%, mild conc LVH, grade 1 diastolic dysfunction; calcicifed MV annulus, mild MR; LA mild-mod dilated; RA mod dilated; mod TR; PA peak pressure 89mHg, RVSP increased (pulm htn)  . Nm myocar perf wall motion  12/2010    persnatine - moderate perfusion defect in apical  anterior/apical/basal inferolateral/mid inferolateral/apical lateral walls - EF 65% - low risk  . Cardiac catheterization  09/23/2002    3 vessel CAD >> CABG (Dr. RMarella Chimes  . Coronary artery bypass graft  09/24/2002    LIMA to LAD, SVG to Cfx, SVG to RCA (Dr. VPrescott Gum  . Cardiac catheterization  12/23/2003    patent LIMA, LAD, SVG to OM1, SVG to RCA, distal disease beyond graft insertion (Dr. RMarella Chimes  . Renal artery angioplasty  02/11/2003    transluminal angioplasty & stent wtih [id number] balloon expandable stent premounted on Cordis aviatory balloon (Dr. R.Charlyne Petrin . Carotid doppler  03/2013    right bulb & prox ICA w/50-69% stenosis, L ICA stent open/patent  . Renal doppler  09/2012    R prox renal artery @ stent equal/less that 60% diameter reduction; L renal artery 1-59% stenosis  . Lower extremity arterial doppler  09/2012    bilateral ABIS (mod arterial insuff at rest); R SFA 70-99% stenosis, L SFA 50-69% stenosis  . Carotid stent insertion N/A 02/12/2012    Procedure: CAROTID STENT INSERTION;  Surgeon: VaSerafina MitchellMD;  [facility];  Service: Cardiovascular;  Laterality: N/A;  . Carotid angiogram Bilateral 02/12/2012    Procedure: CAROTID ANGIOGRAM;  Surgeon: VaSerafina MitchellMD;  [facility];  Service: Cardiovascular;  Laterality: Bilateral;     Current Outpatient Prescriptions  Medication Sig Dispense Refill  . cyclobenzaprine (FLEXERIL) 5 MG tablet Take 1 tablet (5 mg total) by mouth 2 (two) times daily as needed for muscle spasms. 30 tablet 0  . metoprolol (LOPRESSOR) 50 MG tablet Take 1 tablet (50 mg total) by mouth daily. 270 tablet 3  . rosuvastatin (CRESTOR) 40 MG tablet Take 1 tablet (40 mg total) by mouth daily. 30 tablet 10  . amLODipine (NORVASC) 10 MG tablet Take 1 tablet (10 mg total) by mouth daily. 90 tablet 3  . apixaban (ELIQUIS) 5 MG TABS tablet Take 1 tablet (5 mg total) by mouth 2 (two) times daily. 60 tablet 5  .  isosorbide mononitrate (IMDUR) 30 MG 24 hr tablet Take 1 tablet (30 mg total) by mouth daily. 30 tablet 11  . lansoprazole (PREVACID) 15 MG capsule Take 1 capsule (15 mg total) by mouth daily. 90 capsule 3  . metFORMIN (GLUCOPHAGE) 1000 MG tablet Take 1 tablet (1,000 mg total) by mouth 2 (two) times daily with a meal. 180 tablet 3  . nitroGLYCERIN (NITROSTAT) 0.4 MG SL tablet Place 1 tablet (0.4 mg total) under the tongue every 5 (five) minutes as needed for chest pain. 25 tablet 6  . triamterene-hydrochlorothiazide (MAXZIDE-25) 37.5-25 MG per tablet Take 1 tablet by mouth daily. 90 tablet 3  . valsartan (DIOVAN) 160 MG tablet Take 1 tablet (160 mg total) by mouth 2 (two) times daily. 180 tablet 3   No current facility-administered medications for this visit.    Allergies:   Review of patient's allergies  indicates no known allergies.    Social History:  The patient  reports that she quit smoking about 13 years ago. She does not have any smokeless tobacco history on file. She reports that she drinks about 1.2 - 1.8 oz of alcohol per week. She reports that she does not use illicit drugs.   Family History:  The patient's family history includes Deep vein thrombosis in her sister; Diabetes in her brother and sister; Heart disease in her sister; Hyperlipidemia in her sister; Hypertension in her brother, mother, sister, and son.    ROS:  Please see the history of present illness.    Otherwise, review of systems positive for none.   All other systems are reviewed and negative.    PHYSICAL EXAM: VS:  BP 134/66 mmHg  Pulse 65  Ht 5' 3" (1.6 m)  Wt 189 lb (85.73 kg)  BMI 33.49 kg/m2 , BMI Body mass index is 33.49 kg/(m^2).  General: Alert, oriented x3, no distress Head: no evidence of trauma, PERRL, EOMI, no exophtalmos or lid lag, no myxedema, no xanthelasma; normal ears, nose and oropharynx Neck: normal jugular venous pulsations and no hepatojugular reflux; brisk carotid pulses without delay  and no carotid bruits Chest: clear to auscultation, no signs of consolidation by percussion or palpation, normal fremitus, symmetrical and full respiratory excursions Cardiovascular: normal position and quality of the apical impulse, irregular rhythm, normal first and second heart sounds,  Early peaking grade 2/6 systolic ejection murmur in the aortic focus, no diastolic murmurs, rubs or gallops Abdomen: no tenderness or distention, no masses by palpation, no abnormal pulsatility or arterial bruits, normal bowel sounds, no hepatosplenomegaly Extremities: no clubbing, cyanosis or edema; 2+ radial, ulnar and brachial pulses bilaterally; 2+ right femoral, posterior tibial and dorsalis pedis pulses; 2+ left femoral, posterior tibial and dorsalis pedis pulses; no subclavian or femoral bruits Neurological: grossly nonfocal Psych: euthymic mood, full affect   EKG:  EKG is ordered today. The ekg ordered today demonstrates  Atrial fibrillation, incomplete right bundle branch block , minor T-wave inversion V1-V4 suggestive of anterior ischemia.  The atrial fibrillation is new but otherwise there are no ECG changes compared to May 2015   Recent Labs: No results found for requested labs within last 365 days.    Lipid Panel    Component Value Date/Time   CHOL 101 11/30/2013 1014   TRIG 70 11/30/2013 1014   HDL 43 11/30/2013 1014   CHOLHDL 2.3 11/30/2013 1014   VLDL 14 11/30/2013 1014   LDLCALC 44 11/30/2013 1014      Wt Readings from Last 3 Encounters:  03/03/15 189 lb (85.73 kg)  10/26/14 187 lb (84.823 kg)  02/15/14 200 lb (90.719 kg)      ASSESSMENT AND PLAN:  1.  Recurrent asymptomatic atrial fibrillation -  Unclear whether this is still paroxysmal or persistent, considering the fact that it is asymptomatic and she was reportedly in an irregular rhythm 3 months ago. Her risk of embolic stroke is high. CHADSVasc score is 3 ( gender, hypertension, vascular disease ). She has never had a  stroke or TIA that I'm aware of , although she has had carotid artery stenting.  Ideally she would switch from aspirin/clopidogrel to atrial anticoagulant such as Ellik was 5 mg twice a day. We discussed the improved protection from embolic stroke that this medication provides , but also its bleeding risk. Before she makes the switch would like to final at about the results of her workup for occult  GI bleeding.  the arrhythmia is asymptomatic and rate control is excellent. Antiarrhythmics are not indicated , neither is cardioversion  2. CAD, CABG X 3 2004, distal LAD diffuse disease, Myoview anterior wall ischemia 11/2013 Seems best suited for medical therapy at this time no good revascularization options for the anterior wall. Asymptomatic.  3.  Left carotid stenosis status post stent 2 years status post left carotid stenting. 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 There is also 50% stenosis in the M1 segment of the left middle cerebral artery.  No neurological complaints  4. PVD (peripheral vascular disease)  Currently asymptomatic. Last duplex ultrasound performed in April 2016 showed bilateral ABI of 0.69-0.79. There were stenoses bilaterally in the mid to distal superficial femoral artery, right greater than left there was bilateral 3 vessel runoff.  She has very minimal claudication now. The pain does not prevent her from doing any activity. Continue to monitor, for for revascularization if she develops claudication.   5. Right renal artery stenosis s/p stent  Minimally abnormal renal function.  Excellent blood pressure control  6. Type 2 DM and hyperlipidemia on statin    Current medicines are reviewed at length with the patient today.  The patient does not have concerns regarding medicines.  Labs/ tests ordered today include:  Orders Placed This Encounter  Procedures  .  EKG 12-Lead     Patient Instructions  Medication Instructions:   STOP THE ASPIRIN AND PLAVIX  START ELIQUIS 5MG TWICE DAILY  - DO THIS MEDICATION CHANGE AFTER YOU HAVE THE RESULTS OF THE HEMOCCULT CARDS.    Follow-Up:  Dr. Sallyanne Kuster recommends that you schedule a follow-up appointment in: 3-4 MONTHS           Signed, Geraline Halberstadt, MD  03/03/2015 5:26 PM    Sanda Klein, MD, [facility] [phone] office [phone] pager

## 2015-03-03 NOTE — Patient Instructions (Signed)
Medication Instructions:   STOP THE ASPIRIN AND PLAVIX  START ELIQUIS 5MG  TWICE DAILY  - DO THIS MEDICATION CHANGE AFTER YOU HAVE THE RESULTS OF THE HEMOCCULT CARDS.    Follow-Up:  Dr. Sallyanne Kuster recommends that you schedule a follow-up appointment in: 3-4 MONTHS

## 2015-03-14 DIAGNOSIS — I10 Essential (primary) hypertension: Secondary | ICD-10-CM | POA: Diagnosis not present

## 2015-03-14 DIAGNOSIS — E119 Type 2 diabetes mellitus without complications: Secondary | ICD-10-CM | POA: Diagnosis not present

## 2015-03-14 DIAGNOSIS — N183 Chronic kidney disease, stage 3 (moderate): Secondary | ICD-10-CM | POA: Diagnosis not present

## 2015-03-14 DIAGNOSIS — I701 Atherosclerosis of renal artery: Secondary | ICD-10-CM | POA: Diagnosis not present

## 2015-03-15 ENCOUNTER — Other Ambulatory Visit: Payer: Self-pay | Admitting: Cardiovascular Disease

## 2015-03-15 ENCOUNTER — Telehealth: Payer: Self-pay | Admitting: Cardiovascular Disease

## 2015-03-15 ENCOUNTER — Other Ambulatory Visit (HOSPITAL_COMMUNITY): Payer: Self-pay | Admitting: Cardiology

## 2015-03-15 NOTE — Telephone Encounter (Signed)
Received records from [facility] for appointment on 06/02/15 with Dr Sallyanne Kuster.  Recordsgiven to [employer] (medical records) for Dr Victorino December schedule on 06/02/15. lp

## 2015-03-18 ENCOUNTER — Encounter: Payer: Self-pay | Admitting: Cardiovascular Disease

## 2015-03-28 ENCOUNTER — Encounter: Payer: Self-pay | Admitting: Cardiovascular Disease

## 2015-04-14 ENCOUNTER — Other Ambulatory Visit: Payer: Self-pay | Admitting: Cardiovascular Disease

## 2015-04-14 DIAGNOSIS — I701 Atherosclerosis of renal artery: Secondary | ICD-10-CM

## 2015-04-14 DIAGNOSIS — Z959 Presence of cardiac and vascular implant and graft, unspecified: Secondary | ICD-10-CM

## 2015-04-21 ENCOUNTER — Ambulatory Visit (HOSPITAL_COMMUNITY)
Admission: RE | Admit: 2015-04-21 | Discharge: 2015-04-21 | Disposition: A | Discharge status: Home / Self Care | Payer: Medicare PPO | Source: Ambulatory Visit | Attending: Cardiology | Admitting: Cardiology

## 2015-04-21 DIAGNOSIS — I2510 Atherosclerotic heart disease of native coronary artery without angina pectoris: Secondary | ICD-10-CM | POA: Diagnosis not present

## 2015-04-21 DIAGNOSIS — I701 Atherosclerosis of renal artery: Secondary | ICD-10-CM | POA: Diagnosis not present

## 2015-04-21 DIAGNOSIS — Z959 Presence of cardiac and vascular implant and graft, unspecified: Secondary | ICD-10-CM | POA: Diagnosis not present

## 2015-04-21 DIAGNOSIS — E119 Type 2 diabetes mellitus without complications: Secondary | ICD-10-CM | POA: Diagnosis not present

## 2015-05-28 ENCOUNTER — Other Ambulatory Visit: Payer: Self-pay | Admitting: Cardiovascular Disease

## 2015-05-30 NOTE — Telephone Encounter (Signed)
Rx request sent to pharmacy.  

## 2015-06-02 ENCOUNTER — Ambulatory Visit (INDEPENDENT_AMBULATORY_CARE_PROVIDER_SITE_OTHER): Payer: Medicare PPO | Admitting: Cardiovascular Disease

## 2015-06-02 ENCOUNTER — Encounter: Payer: Self-pay | Admitting: Cardiovascular Disease

## 2015-06-02 VITALS — BP 122/88 | HR 56 | Ht 64.0 in | Wt 194.3 lb

## 2015-06-02 DIAGNOSIS — I6523 Occlusion and stenosis of bilateral carotid arteries: Secondary | ICD-10-CM

## 2015-06-02 DIAGNOSIS — I480 Paroxysmal atrial fibrillation: Secondary | ICD-10-CM | POA: Diagnosis not present

## 2015-06-02 DIAGNOSIS — I739 Peripheral vascular disease, unspecified: Secondary | ICD-10-CM

## 2015-06-02 DIAGNOSIS — I10 Essential (primary) hypertension: Secondary | ICD-10-CM

## 2015-06-02 DIAGNOSIS — I701 Atherosclerosis of renal artery: Secondary | ICD-10-CM | POA: Diagnosis not present

## 2015-06-02 DIAGNOSIS — I25718 Atherosclerosis of autologous vein coronary artery bypass graft(s) with other forms of angina pectoris: Secondary | ICD-10-CM

## 2015-06-02 NOTE — Patient Instructions (Signed)
Your physician has requested that you have a carotid duplex. This test is an ultrasound of the carotid arteries in your neck. It looks at blood flow through these arteries that supply the brain with blood. Allow one hour for this exam. There are no restrictions or special instructions.  Dr. Sallyanne Kuster recommends that you schedule a follow-up appointment in: [place]

## 2015-06-02 NOTE — Progress Notes (Signed)
Patient ID: [name], female   DOB: 10/24/1950, 64 y.o.   MRN: [id number]      Cardiology Office Note   Date:  06/02/2015   ID:  [name], DOB 11-24-1950, MRN [id number]  PCP:  Tivis Ringer, MD  Cardiologist:   Sanda Klein, MD   Chief Complaint  Patient presents with  . Follow-up    no chest pain, no shortness of breath, no edema, no pain in legs, occassioanl cramping in legs/feet, no lightheadedness, no dizziness      History of Present Illness: [name] is a 64 y.o. female who presents for  Follow-up for coronary artery disease, peripheral artery disease (carotid, renal, lower extremity arterial bed involvement), persistent atrial fibrillation, hyperlipidemia, hypertension.   She feels great and has had no new health problems since her last appointment. She is unaware of palpitations or irregularities in her heartbeat. She has not had new focal neurological events. She denies bleeding complications with Ellik was other than occasional gum bleeding when she brushes her teeth. She denies angina pectoris or exertional dyspnea. She has not had lower extremity edema.  She denies intermittent claudication with her current level of physical activity.   labs were performed at [facility] and were reportedly all in desirable range. She had 3 negative Hemoccults tests.   Her rhythm today remains irregular. I suspect she has persistent atrial fibrillation, but cannot exclude the possibility that I have caught her twice in a row during an episode of paroxysmal atrial fibrillation.  She had CABG X 3 with LIMA-LAD, SVG-OM,SVG-RCA 2004. Re studied in 2005 and this showed patent grafts, with distal LAD and OM disease. Her Myoview in 7/12 was low risk, but repeat in 11/2013 showed new anterolateral ischemia. She has no good revascularization options in the anterior wall. Although the LIMA anastomosis is patent there is no retrograde flow to the proximal LAD and the  distal LAD is diffusely diseased and not amenable to PCI. Her most recent echocardiogram in January 2014 showed normal left ventricular systolic function and regional wall motion. Echocardiogram in January 2014 showed normal left ventricular systolic function and wall motion with an estimated systolic PA pressure of 44 mm Hg  She had AF in Jan 2013. In March of 2013 she had DCCV, which is when I first met her. She was in normal sinus rhythm in August 2015 in the clinic.  In September 2016 she presented with rate control asymptomatic atrial fibrillation. She has PAD and has had prior renal and popliteal stenting.  By Duplex US April 2015, bilateral ABI 0.60, 70-99% stenosis of right SFA, 50-69% on left.  Last renal artery scan showed less than 60% stenosis bilaterally, with a widely patent stent on the right side.  She had left carotid stenting 02/12/12 and post op she was hypotensive and bradycardic.  She has hyperlipidemia, diabetes mellitus and hypertension. Her most recent serum creatinine in June 2015 was 1.2.  Past Medical History  Diagnosis Date  . Irregular heart beat     atrial fibrillation  . CAD (coronary artery disease) 2004    multivessel, status post CABG x3  . Myocardial infarction ([place]) 2002  . Peripheral vascular disease (HCC)     S/P popliteal artery and renal artery stents  . Vaginal cancer ([place]) 1983  . S/P CABG x 3 2004  . History of tobacco abuse   . Carotid artery disease ([place])     stent in 2013  . Diabetes ([place])   .  Renovascular hypertension     Past Surgical History  Procedure Laterality Date  . Cardioversion  09/11/2011    Procedure: CARDIOVERSION;  Surgeon: Sanda Klein, MD;  Location: [facility] OR;  Service: Cardiovascular;  Laterality: N/A;  . Carotid stent  02/12/2012    Carotid Stent Insertion, Carotid Angiogram - 9x40 Cordis Precise stent  (Dr. Pierre Bali)  . Transthoracic echocardiogram  07/2012    EF 55-65%, mild conc LVH, grade 1 diastolic dysfunction;  calcicifed MV annulus, mild MR; LA mild-mod dilated; RA mod dilated; mod TR; PA peak pressure 68mHg, RVSP increased (pulm htn)  . Nm myocar perf wall motion  12/2010    persnatine - moderate perfusion defect in apical anterior/apical/basal inferolateral/mid inferolateral/apical lateral walls - EF 65% - low risk  . Cardiac catheterization  09/23/2002    3 vessel CAD >> CABG (Dr. RMarella Chimes  . Coronary artery bypass graft  09/24/2002    LIMA to LAD, SVG to Cfx, SVG to RCA (Dr. VPrescott Gum  . Cardiac catheterization  12/23/2003    patent LIMA, LAD, SVG to OM1, SVG to RCA, distal disease beyond graft insertion (Dr. RMarella Chimes  . Renal artery angioplasty  02/11/2003    transluminal angioplasty & stent wtih 4x122mGenesis balloon expandable stent premounted on Cordis aviatory balloon (Dr. R.Charlyne Petrin . Carotid doppler  03/2013    right bulb & prox ICA w/50-69% stenosis, L ICA stent open/patent  . Renal doppler  09/2012    R prox renal artery @ stent equal/less that 60% diameter reduction; L renal artery 1-59% stenosis  . Lower extremity arterial doppler  09/2012    bilateral ABIS (mod arterial insuff at rest); R SFA 70-99% stenosis, L SFA 50-69% stenosis  . Carotid stent insertion N/A 02/12/2012    Procedure: CAROTID STENT INSERTION;  Surgeon: VaSerafina MitchellMD;  [facility];  Service: Cardiovascular;  Laterality: N/A;  . Carotid angiogram Bilateral 02/12/2012    Procedure: CAROTID ANGIOGRAM;  Surgeon: VaSerafina MitchellMD;  [facility];  Service: Cardiovascular;  Laterality: Bilateral;     Current Outpatient Prescriptions  Medication Sig Dispense Refill  . amLODipine (NORVASC) 10 MG tablet TAKE 1 TABLET EVERY DAY 90 tablet 3  . apixaban (ELIQUIS) 5 MG TABS tablet Take 1 tablet (5 mg total) by mouth 2 (two) times daily. 60 tablet 5  . isosorbide mononitrate (IMDUR) 30 MG 24 hr tablet Take 1 tablet (30 mg total) by mouth daily. 30 tablet 11  . lansoprazole (PREVACID) 15 MG  capsule Take 1 capsule (15 mg total) by mouth daily. 90 capsule 3  . metFORMIN (GLUCOPHAGE) 1000 MG tablet Take 1 tablet (1,000 mg total) by mouth 2 (two) times daily with a meal. 180 tablet 3  . metoprolol (LOPRESSOR) 50 MG tablet Take 1 tablet (50 mg total) by mouth daily. 270 tablet 3  . nitroGLYCERIN (NITROSTAT) 0.4 MG SL tablet Place 1 tablet (0.4 mg total) under the tongue every 5 (five) minutes as needed for chest pain. 25 tablet 6  . rosuvastatin (CRESTOR) 40 MG tablet TAKE 1 TABLET BY MOUTH DAILY 30 tablet 2  . triamterene-hydrochlorothiazide (MAXZIDE-25) 37.5-25 MG per tablet TAKE 1 TABLET EVERY DAY 90 tablet 3  . valsartan (DIOVAN) 160 MG tablet TAKE 1 TABLET TWICE DAILY 180 tablet 3   No current facility-administered medications for this visit.    Allergies:   Review of patient's allergies indicates no known allergies.    Social History:  The patient  reports that she quit smoking about 13 years ago. She does not have any smokeless tobacco history on file. She reports that she drinks about 1.2 - 1.8 oz of alcohol per week. She reports that she does not use illicit drugs.   Family History:  The patient's family history includes Deep vein thrombosis in her sister; Diabetes in her brother and sister; Heart disease in her sister; Hyperlipidemia in her sister; Hypertension in her brother, mother, sister, and son.    ROS:  Please see the history of present illness.    Otherwise, review of systems positive for none.   All other systems are reviewed and negative.    PHYSICAL EXAM: VS:  BP 122/88 mmHg  Pulse 56  Ht _0  (1.626 m)  Wt 194 lb 5 oz (88.14 kg)  BMI 33.34 kg/m2 , BMI Body mass index is 33.34 kg/(m^2).  General: Alert, oriented x3, no distress Head: no evidence of trauma, PERRL, EOMI, no exophtalmos or lid lag, no myxedema, no xanthelasma; normal ears, nose and oropharynx Neck: normal jugular venous pulsations and no hepatojugular reflux; brisk carotid pulses without  delay and no carotid bruits Chest: clear to auscultation, no signs of consolidation by percussion or palpation, normal fremitus, symmetrical and full respiratory excursions Cardiovascular: normal position and quality of the apical impulse, regular rhythm, normal first and second heart sounds, 2/6 systolic ejection murmur in the aortic focus is early peaking, no diastolic murmurs, rubs or gallops Abdomen: no tenderness or distention, no masses by palpation, no abnormal pulsatility or arterial bruits, normal bowel sounds, no hepatosplenomegaly Extremities: no clubbing, cyanosis or edema; 2+ radial, ulnar and brachial pulses bilaterally; 2+ right femoral, posterior tibial and dorsalis pedis pulses; 2+ left femoral, posterior tibial and dorsalis pedis pulses; no subclavian or femoral bruits Neurological: grossly nonfocal Psych: euthymic mood, full affect   EKG:  EKG is not ordered today.   Recent Labs: No results found for requested labs within last 365 days.    Lipid Panel    Component Value Date/Time   CHOL 101 11/30/2013 1014   TRIG 70 11/30/2013 1014   HDL 43 11/30/2013 1014   CHOLHDL 2.3 11/30/2013 1014   VLDL 14 11/30/2013 1014   LDLCALC 44 11/30/2013 1014      Wt Readings from Last 3 Encounters:  06/02/15 194 lb 5 oz (88.14 kg)  03/03/15 189 lb (85.73 kg)  10/26/14 187 lb (84.823 kg)    .   ASSESSMENT AND PLAN:  1. Recurrent asymptomatic atrial fibrillation - Unclear whether this is still paroxysmal or persistent.  The latter is more likely, considering the fact that it is asymptomatic and she was reportedly in an irregular rhythm 3 months ago and 6 months ago. Her risk of embolic stroke is high. CHADSVasc score is 4 ( gender, hypertension, vascular disease, DM). She has never had a stroke or TIA that I'm aware of , although she has had carotid artery stenting. Continue anticoagulant, Eliquis 5 mg twice a day. Negative workup for occult GI bleeding. The arrhythmia is  asymptomatic and rate control is excellent. Antiarrhythmics are not indicated , neither is cardioversion  2. CAD, CABG X 3 2004, distal LAD diffuse disease, Myoview anterior wall ischemia 11/2013 Seems best suited for medical therapy at this time no good revascularization options for the anterior wall. Asymptomatic.  3. Left carotid stenosis status post stent 2 years status post left carotid stenting. 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 There is also 50% stenosis in the M1 segment of the left middle cerebral artery. No neurological complaints.  Last ultrasound was in September 2014 and the test that had been scheduled in 2015 was never performed. We'll reorder it.  4. PVD (peripheral vascular disease)  Currently asymptomatic. Last duplex ultrasound performed in April 2016 showed bilateral ABI of 0.69-0.79. There were stenoses bilaterally in the mid to distal superficial femoral artery, right greater than left there was bilateral 3 vessel runoff. She has very minimal claudication now. The pain does not prevent her from doing any activity. Continue to monitor for revascularization, if she develops claudication.   5. Right renal artery stenosis s/p stent  Minimally abnormal renal function. Excellent blood pressure control.  Ultrasound October 2016 showed patent renal stent and no stenoses  6. Type 2 DM and hyperlipidemia on statin. Labs with Dr. Dagmar Hait.    Current medicines are reviewed at length with the patient today.  The patient does not have concerns regarding medicines.  The following changes have been made:  no change  Labs/ tests ordered today include:  No orders of the defined types were placed in this encounter.    Patient Instructions  Your physician has requested that you have a carotid duplex. This test is an ultrasound of the carotid arteries in your neck. It  looks at blood flow through these arteries that supply the brain with blood. Allow one hour for this exam. There are no restrictions or special instructions.  Dr. Sallyanne Kuster recommends that you schedule a follow-up appointment in: ONE YEAR        SignedSanda Klein, MD  06/02/2015 3:10 PM    Sanda Klein, MD, [facility] [phone] office [phone] pager

## 2015-06-22 ENCOUNTER — Other Ambulatory Visit: Payer: Self-pay | Admitting: Cardiovascular Disease

## 2015-07-06 ENCOUNTER — Ambulatory Visit (HOSPITAL_COMMUNITY)
Admission: RE | Admit: 2015-07-06 | Discharge: 2015-07-06 | Disposition: A | Discharge status: Home / Self Care | Payer: Medicare Other | Source: Ambulatory Visit | Attending: Cardiovascular Disease | Admitting: Cardiovascular Disease

## 2015-07-06 DIAGNOSIS — Z87891 Personal history of nicotine dependence: Secondary | ICD-10-CM | POA: Diagnosis not present

## 2015-07-06 DIAGNOSIS — E119 Type 2 diabetes mellitus without complications: Secondary | ICD-10-CM | POA: Insufficient documentation

## 2015-07-06 DIAGNOSIS — I6523 Occlusion and stenosis of bilateral carotid arteries: Secondary | ICD-10-CM

## 2015-07-07 ENCOUNTER — Telehealth: Payer: Self-pay | Admitting: *Deleted

## 2015-07-07 DIAGNOSIS — I6523 Occlusion and stenosis of bilateral carotid arteries: Secondary | ICD-10-CM

## 2015-07-07 NOTE — Telephone Encounter (Signed)
Carotid doppler results called to patient.  Order placed for recheck in one year.  Patient voiced understanding.

## 2015-07-07 NOTE — Telephone Encounter (Signed)
-----   Message from Sanda Klein, MD sent at 07/06/2015  6:03 PM EST ----- Slight narrowing at the level of the old left carotid stent, but only in moderate range. Will recheck in one year, please

## 2015-08-30 ENCOUNTER — Other Ambulatory Visit: Payer: Self-pay | Admitting: Cardiovascular Disease

## 2015-08-30 MED ORDER — ROSUVASTATIN CALCIUM 40 MG PO TABS: 40.0000 mg | ORAL_TABLET | Freq: Every day | ORAL | Status: DC

## 2015-09-14 ENCOUNTER — Telehealth: Payer: Self-pay

## 2015-09-14 NOTE — Telephone Encounter (Signed)
Prior auth for Eliquis 5 mg submitted to [facility]. 

## 2015-09-16 ENCOUNTER — Telehealth: Payer: Self-pay

## 2015-09-16 MED ORDER — APIXABAN 5 MG PO TABS: 5.0000 mg | ORAL_TABLET | Freq: Two times a day (BID) | ORAL | Status: DC

## 2015-09-16 NOTE — Telephone Encounter (Signed)
Please call pt regarding elquis refill [phone]

## 2015-09-16 NOTE — Telephone Encounter (Signed)
Eliquis approved through 07/01/2016. PA [id number].

## 2015-09-16 NOTE — Telephone Encounter (Signed)
Patient notified her Eliquis was approved.

## 2015-09-16 NOTE — Telephone Encounter (Signed)
Pt had Prior Auth needed for Eliquis coverage - looks like this was submitted on 15th. She requested local pharmacy pickup for a 30 day supply of Eliquis - i sent this to [facility]. Pt aware I will request Vaughan Basta update her on status of Prior Auth.

## 2016-01-20 ENCOUNTER — Telehealth: Payer: Self-pay

## 2016-01-20 NOTE — Telephone Encounter (Signed)
Patient brought in 2 forms on Tuesday, 01/17/2016, 1-handicap placard application and 2-jury summons. She wants to be written out of jury duty. Forms given to Dr C for review. Upon his review, the last time he saw her was 06/2015. She was completely asymptomatic. Per MCr, she doesn't meet criteria for either due to being asymptomatic. Attempted to contact patient then, phone just rang and rang, unable to reach patient or leave a message.  Patient's husband, [name], came into the office on 01/19/2016 to pick up patient's forms. Notified him for MCr's recommendations. I asked if she had been complaining of any chest pain or shortness of breath. [name] denied reports of chest pain but stated that she gets short of breath with strenuous exertion. [name] requested that I contact him with any updates regarding these forms. Number given [phone].   Upon this information, Dr C completed the application for the handicap placard. He will NOT write her out of jury duty. He also recommended that if she is having problems, she needs to be seen and evaluated.  Attempted to contact [name], once again, because [name] is not listed on her DPR. Again, phone just rang and rang. Unable to leave a message or reach patient. Attempted to contact patient at other number listed on DPR, which is disconnected. Left generic message for [name] to call back to number he provided yesterday.

## 2016-01-20 NOTE — Telephone Encounter (Signed)
Returned call to [name].  Notified him the forms are ready for pick and that Dr C will not be writing patient out of jury duty. Elaine Crespin verbalized understanding and will have patient make an appointment if needed.

## 2016-01-30 ENCOUNTER — Other Ambulatory Visit: Payer: Self-pay | Admitting: Cardiovascular Disease

## 2016-01-30 NOTE — Telephone Encounter (Signed)
REFILL 

## 2016-02-13 ENCOUNTER — Other Ambulatory Visit: Payer: Self-pay | Admitting: Cardiovascular Disease

## 2016-03-07 ENCOUNTER — Encounter: Payer: Self-pay | Admitting: Internal Medicine

## 2016-04-13 ENCOUNTER — Other Ambulatory Visit: Payer: Self-pay | Admitting: Cardiovascular Disease

## 2016-04-13 DIAGNOSIS — I701 Atherosclerosis of renal artery: Secondary | ICD-10-CM

## 2016-04-25 ENCOUNTER — Ambulatory Visit (HOSPITAL_COMMUNITY)
Admission: RE | Admit: 2016-04-25 | Discharge: 2016-04-25 | Disposition: A | Discharge status: Home / Self Care | Payer: Medicare Other | Source: Ambulatory Visit | Attending: Cardiovascular Disease | Admitting: Cardiovascular Disease

## 2016-04-25 DIAGNOSIS — Z951 Presence of aortocoronary bypass graft: Secondary | ICD-10-CM | POA: Diagnosis not present

## 2016-04-25 DIAGNOSIS — E785 Hyperlipidemia, unspecified: Secondary | ICD-10-CM | POA: Insufficient documentation

## 2016-04-25 DIAGNOSIS — I2510 Atherosclerotic heart disease of native coronary artery without angina pectoris: Secondary | ICD-10-CM | POA: Diagnosis not present

## 2016-04-25 DIAGNOSIS — E1151 Type 2 diabetes mellitus with diabetic peripheral angiopathy without gangrene: Secondary | ICD-10-CM | POA: Insufficient documentation

## 2016-04-25 DIAGNOSIS — I10 Essential (primary) hypertension: Secondary | ICD-10-CM | POA: Diagnosis not present

## 2016-04-25 DIAGNOSIS — Z87891 Personal history of nicotine dependence: Secondary | ICD-10-CM | POA: Insufficient documentation

## 2016-04-25 DIAGNOSIS — I701 Atherosclerosis of renal artery: Secondary | ICD-10-CM | POA: Diagnosis not present

## 2016-04-25 DIAGNOSIS — Z9889 Other specified postprocedural states: Secondary | ICD-10-CM | POA: Insufficient documentation

## 2016-05-01 ENCOUNTER — Encounter: Payer: Self-pay | Admitting: *Deleted

## 2016-05-14 ENCOUNTER — Telehealth: Payer: Self-pay

## 2016-05-14 ENCOUNTER — Ambulatory Visit (INDEPENDENT_AMBULATORY_CARE_PROVIDER_SITE_OTHER): Payer: Medicare Other | Admitting: Internal Medicine

## 2016-05-14 ENCOUNTER — Other Ambulatory Visit: Payer: Self-pay | Admitting: Cardiovascular Disease

## 2016-05-14 ENCOUNTER — Encounter (INDEPENDENT_AMBULATORY_CARE_PROVIDER_SITE_OTHER): Payer: Self-pay

## 2016-05-14 ENCOUNTER — Encounter: Payer: Self-pay | Admitting: Cardiovascular Disease

## 2016-05-14 ENCOUNTER — Encounter: Payer: Self-pay | Admitting: Internal Medicine

## 2016-05-14 VITALS — BP 128/66 | HR 64 | Ht 65.16 in | Wt 182.0 lb

## 2016-05-14 DIAGNOSIS — Z7901 Long term (current) use of anticoagulants: Secondary | ICD-10-CM

## 2016-05-14 DIAGNOSIS — I4891 Unspecified atrial fibrillation: Secondary | ICD-10-CM

## 2016-05-14 DIAGNOSIS — R195 Other fecal abnormalities: Secondary | ICD-10-CM

## 2016-05-14 NOTE — Patient Instructions (Addendum)
You have been scheduled for a colonoscopy. Please follow written instructions given to you at your visit today.  Please pick up your prep supplies at the pharmacy . If you use inhalers (even only as needed), please bring them with you on the day of your procedure. Your physician has requested that you go to www.startemmi.com and enter the access code given to you at your visit today. This web site gives a general overview about your procedure. However, you should still follow specific instructions given to you by our office regarding your preparation for the procedure.    You will be contaced by our office prior to your procedure for directions on holding your Eliquis.  If you do not hear from our office 1 week prior to your scheduled procedure, please call [phone] to discuss.    I appreciate the opportunity to care for you. Silvano Rusk, MD, [facility]

## 2016-05-14 NOTE — Telephone Encounter (Signed)
Letter sent via epic to Dr. Carlean Purl in-basket. Please let me know if you need it faxed. MCr

## 2016-05-14 NOTE — Telephone Encounter (Signed)
[facility] [address]  [place] [address]  05/14/2016   RE: [name] DOB: [birth year] MRN: [id number]   Dear Dr Sanda Klein,    We have scheduled the above patient for an endoscopic procedure. Our records show that she is on anticoagulation therapy.   Please advise as to how long the patient may come off her therapy of Eliquis prior to the colonoscopy procedure, which is scheduled for 07/17/16.  Please fax back/ or route the completed form to Kamir Selover [place], [place] at [phone].   Sincerely,    Silvano Rusk, MD, [facility]

## 2016-05-14 NOTE — Telephone Encounter (Signed)
Per Dr Sallyanne Kuster okay to hold the Eliquis for 2 days prior to her procedure. Patient informed and verbalized understanding.

## 2016-05-14 NOTE — Progress Notes (Signed)
[name] 65 y.o. 07-15-1950 [id number]  Assessment & Plan:   Encounter Diagnoses  Name Primary?  . Heme + stool Yes  . Long term current use of anticoagulant   . Atrial fibrillation, unspecified type ([place])     Colonoscopy is appropriate to investigate heme + stool. The risks and benefits as well as alternatives of endoscopic procedure(s) have been discussed and reviewed. All questions answered. The patient agrees to proceed. Extra risk of stroke off Eliquis which needs to be held to reduce bleeding risk is explained. Will double check with cardiologist.  I appreciate the opportunity to care for this patient. GY:FVCB,SWHQPRFFMB R, MD   Subjective:   Chief Complaint: heme + stool  HPI Recent screening iFOBT was +. No active GI sxs.  Takes eliquis for stroke prevention in setting of afib. No problems there.  No Known Allergies Outpatient Medications Prior to Visit  Medication Sig Dispense Refill  . amLODipine (NORVASC) 10 MG tablet Take 1 tablet (10 mg total) by mouth daily. NEED OV. 90 tablet 0  . ELIQUIS 5 MG TABS tablet TAKE 1 TABLET(5 MG) BY MOUTH TWICE DAILY 60 tablet 5  . isosorbide mononitrate (IMDUR) 30 MG 24 hr tablet Take 1 tablet (30 mg total) by mouth daily. 30 tablet 11  . lansoprazole (PREVACID) 15 MG capsule Take 1 capsule (15 mg total) by mouth daily. 90 capsule 3  . metFORMIN (GLUCOPHAGE) 1000 MG tablet Take 1 tablet (1,000 mg total) by mouth 2 (two) times daily with a meal. 180 tablet 3  . metoprolol (LOPRESSOR) 50 MG tablet TAKE 1 TABLET EVERY DAY 90 tablet 3  . nitroGLYCERIN (NITROSTAT) 0.4 MG SL tablet Place 1 tablet (0.4 mg total) under the tongue every 5 (five) minutes as needed for chest pain. 25 tablet 6  . rosuvastatin (CRESTOR) 40 MG tablet Take 1 tablet (40 mg total) by mouth daily. 30 tablet 11  . triamterene-hydrochlorothiazide (MAXZIDE-25) 37.5-25 MG tablet Take 1 tablet by mouth daily. NEED OV. 90 tablet 0  . valsartan (DIOVAN) 160  MG tablet Take 1 tablet (160 mg total) by mouth 2 (two) times daily. NEED OV. 180 tablet 0   No facility-administered medications prior to visit.    Past Medical History:  Diagnosis Date  . Acute kidney failure ([place])   . ASHD (arteriosclerotic heart disease)    native vessel  . Atrial fibrillation ([place])   . CAD (coronary artery disease) 2004   multivessel, status post CABG x3  . Carotid artery disease ([place])    stent in 2013  . CKD (chronic kidney disease), stage III   . Diabetes ([place])   . GERD (gastroesophageal reflux disease)   . History of tobacco abuse   . Hyperlipidemia   . Hypertension   . Hyperthyroidism   . Myocardial infarction 2002  . Peripheral vascular disease (HCC)    S/P popliteal artery and renal artery stents  . Renovascular hypertension   . S/P CABG x 3 2004  . Thyroid goiter   . Tobacco abuse   . Vaginal cancer ([place]) 1983   Past Surgical History:  Procedure Laterality Date  . CARDIAC CATHETERIZATION  09/23/2002   3 vessel CAD >> CABG (Dr. Marella Chimes)  . CARDIAC CATHETERIZATION  12/23/2003   patent LIMA, LAD, SVG to OM1, SVG to RCA, distal disease beyond graft insertion (Dr. Marella Chimes)  . CARDIOVERSION  09/11/2011   Procedure: CARDIOVERSION;  Surgeon: Sanda Klein, MD;  Location: [facility];  Service: Cardiovascular;  Laterality:  N/A;  . CAROTID ANGIOGRAM Bilateral 02/12/2012   Procedure: CAROTID ANGIOGRAM;  Surgeon: Serafina Mitchell, MD;  Location: [facility];  Service: Cardiovascular;  Laterality: Bilateral;  . CAROTID DOPPLER  03/2013   right bulb & prox ICA w/50-69% stenosis, L ICA stent open/patent  . CAROTID STENT  02/12/2012   Carotid Stent Insertion, Carotid Angiogram - 9x40 Cordis Precise stent  (Dr. Pierre Bali)  . CAROTID STENT INSERTION N/A 02/12/2012   Procedure: CAROTID STENT INSERTION;  Surgeon: Serafina Mitchell, MD;  Location[facility];  Service: Cardiovascular;  Laterality: N/A;  . CORONARY ARTERY BYPASS GRAFT  09/24/2002   LIMA to LAD, SVG  to Cfx, SVG to RCA (Dr. Prescott Gum)  . LOWER EXTREMITY ARTERIAL DOPPLER  09/2012   bilateral ABIS (mod arterial insuff at rest); R SFA 70-99% stenosis, L SFA 50-69% stenosis  . NM MYOCAR PERF WALL MOTION  12/2010   persnatine - moderate perfusion defect in apical anterior/apical/basal inferolateral/mid inferolateral/apical lateral walls - EF 65% - low risk  . RENAL ARTERY ANGIOPLASTY  02/11/2003   transluminal angioplasty & stent wtih 4x50mm Genesis balloon expandable stent premounted on Cordis aviatory balloon (Dr. Charlyne Petrin)  . RENAL DOPPLER  09/2012   R prox renal artery @ stent equal/less that 60% diameter reduction; L renal artery 1-59% stenosis  . TRANSTHORACIC ECHOCARDIOGRAM  07/2012   EF 55-65%, mild conc LVH, grade 1 diastolic dysfunction; calcicifed MV annulus, mild MR; LA mild-mod dilated; RA mod dilated; mod TR; PA peak pressure 52mmHg, RVSP increased (pulm htn)   Social History   Social History  . Marital status: Married    Spouse name: N/A  . Number of children: 2  . Years of education: 12   Occupational History  . retired    Social History Main Topics  . Smoking status: Former Smoker    Packs/day: 1.00    Years: 30.00    Quit date: 07/02/2001  . Smokeless tobacco: Never Used  . Alcohol use 1.2 - 1.8 oz/week    2 - 3 Standard drinks or equivalent per week     Comment: socially  . Drug use: No  . Sexual activity: Not Asked   Other Topics Concern  . None   Social History Narrative  . None   Family History  Problem Relation Age of Onset  . Hypertension Mother   . Other Mother     DJD  . Deep vein thrombosis Sister   . Diabetes Sister   . Heart disease Sister   . Hyperlipidemia Sister   . Hypertension Sister   . Diabetes Brother   . Other Brother     DJD  . Hypertension Son   . Hyperlipidemia Son   . Colon cancer Neg Hx   . Stomach cancer Neg Hx   . Esophageal cancer Neg Hx   . Rectal cancer Neg Hx   . Liver cancer Neg Hx         Review of  Systems + allergies All other ROS negative  Objective:   Physical Exam @BP  128/66   Pulse 64   Ht 5' 5.16" (1.655 m) Comment: w/o shoes  Wt 182 lb (82.6 kg)   BMI 30.14 kg/m @  General:  NAD Eyes:   anicteric Lungs:  clear Heart::  S1S2 no rubs, murmurs or gallops Abdomen:  soft and nontender, BS+ Ext:   no edema, cyanosis or clubbing    Data Reviewed:   NL CBC and CMEt 01/2016

## 2016-05-15 ENCOUNTER — Encounter: Payer: Self-pay | Admitting: Internal Medicine

## 2016-05-15 ENCOUNTER — Other Ambulatory Visit: Payer: Self-pay

## 2016-05-16 NOTE — Telephone Encounter (Signed)
Rx has been sent to the pharmacy electronically. ° °

## 2016-07-03 ENCOUNTER — Encounter: Payer: Self-pay | Admitting: Internal Medicine

## 2016-07-17 ENCOUNTER — Ambulatory Visit (AMBULATORY_SURGERY_CENTER): Payer: Medicare Other | Admitting: Internal Medicine

## 2016-07-17 ENCOUNTER — Encounter: Payer: Self-pay | Admitting: Internal Medicine

## 2016-07-17 VITALS — BP 132/65 | HR 54 | Temp 95.7°F | Resp 12 | Ht 65.0 in | Wt 182.0 lb

## 2016-07-17 DIAGNOSIS — D124 Benign neoplasm of descending colon: Secondary | ICD-10-CM

## 2016-07-17 DIAGNOSIS — R195 Other fecal abnormalities: Secondary | ICD-10-CM | POA: Diagnosis not present

## 2016-07-17 LAB — GLUCOSE, CAPILLARY
GLUCOSE-CAPILLARY: 73 mg/dL (ref 65–99)
Glucose-Capillary: 104 mg/dL — ABNORMAL HIGH (ref 65–99)

## 2016-07-17 MED ORDER — DEXTROSE 5 % IV SOLN: INTRAVENOUS | Status: DC

## 2016-07-17 MED ORDER — SODIUM CHLORIDE 0.9 % IV SOLN: 500.0000 mL | INTRAVENOUS | Status: DC

## 2016-07-17 NOTE — Progress Notes (Signed)
To PACU Pt awake and alert. Report to RN 

## 2016-07-17 NOTE — Op Note (Signed)
[facility] Patient Name: [name] Procedure Date: 07/17/2016 3:22 PM MRN: [id number] Endoscopist: Gatha Mayer , MD Age: 66 Referring MD:  Date of Birth: [birth year] Gender: Female Account #: [id number] Procedure:                Colonoscopy Indications:              Heme positive stool Medicines:                Propofol per Anesthesia, Monitored Anesthesia Care Procedure:                Pre-Anesthesia Assessment:                           - Prior to the procedure, a History and Physical                            was performed, and patient medications and                            allergies were reviewed. The patient's tolerance of                            previous anesthesia was also reviewed. The risks                            and benefits of the procedure and the sedation                            options and risks were discussed with the patient.                            All questions were answered, and informed consent                            was obtained. Prior Anticoagulants: The patient                            last took Eliquis (apixaban) on the day of the                            procedure. ASA Grade Assessment: III - A patient                            with severe systemic disease. After reviewing the                            risks and benefits, the patient was deemed in                            satisfactory condition to undergo the procedure.                           After obtaining informed consent, the colonoscope  was passed under direct vision. Throughout the                            procedure, the patient's blood pressure, pulse, and                            oxygen saturations were monitored continuously. The                            Model CF-HQ190L [id number]) scope was introduced                            through the anus and advanced to the the cecum,                            identified by  appendiceal orifice and ileocecal                            valve. The colonoscopy was performed without                            difficulty. The patient tolerated the procedure                            well. The quality of the bowel preparation was                            excellent. The bowel preparation used was Miralax.                            The appendiceal orifice and the rectum were                            photographed. Scope In: 3:35:22 PM Scope Out: 3:48:08 PM Scope Withdrawal Time: 0 hours 11 minutes 12 seconds  Total Procedure Duration: 0 hours 12 minutes 46 seconds  Findings:                 The perianal and digital rectal examinations were                            normal.                           A diminutive polyp was found in the descending                            colon. The polyp was sessile. The polyp was removed                            with a cold snare. Resection and retrieval were                            complete. Verification of patient identification  for the specimen was done. Estimated blood loss was                            minimal.                           Internal hemorrhoids were found during retroflexion.                           The exam was otherwise without abnormality on                            direct and retroflexion views. Complications:            No immediate complications. Estimated Blood Loss:     Estimated blood loss was minimal. Impression:               - One diminutive polyp in the descending colon,                            removed with a cold snare. Resected and retrieved.                           - Internal hemorrhoids.                           - The examination was otherwise normal on direct                            and retroflexion views. Recommendation:           - Patient has a contact number available for                            emergencies. The signs and symptoms of  potential                            delayed complications were discussed with the                            patient. Return to normal activities tomorrow.                            Written discharge instructions were provided to the                            patient.                           - Resume previous diet.                           - Continue present medications.                           - Resume Eliquis (apixaban) at prior dose today.                           -  Repeat colonoscopy is recommended. The                            colonoscopy date will be determined after pathology                            results from today's exam become available for                            review. Gatha Mayer, MD 07/17/2016 3:55:18 PM This report has been signed electronically.

## 2016-07-17 NOTE — Patient Instructions (Addendum)
I found and removed one small polyp. Also saw hemorrhoids which are probable source of blood in stool.  I will let you know pathology results and when to have another routine colonoscopy by mail.  I appreciate the opportunity to care for you. Gatha Mayer, MD, FACG    YOU HAD AN ENDOSCOPIC PROCEDURE TODAY AT [facility]:   Refer to the procedure report that was given to you for any specific questions about what was found during the examination.  If the procedure report does not answer your questions, please call your gastroenterologist to clarify.  If you requested that your care partner not be given the details of your procedure findings, then the procedure report has been included in a sealed envelope for you to review at your convenience later.  YOU SHOULD EXPECT: Some feelings of bloating in the abdomen. Passage of more gas than usual.  Walking can help get rid of the air that was put into your GI tract during the procedure and reduce the bloating. If you had a lower endoscopy (such as a colonoscopy or flexible sigmoidoscopy) you may notice spotting of blood in your stool or on the toilet paper. If you underwent a bowel prep for your procedure, you may not have a normal bowel movement for a few days.  Please Note:  You might notice some irritation and congestion in your nose or some drainage.  This is from the oxygen used during your procedure.  There is no need for concern and it should clear up in a day or so.  SYMPTOMS TO REPORT IMMEDIATELY:   Following lower endoscopy (colonoscopy or flexible sigmoidoscopy):  Excessive amounts of blood in the stool  Significant tenderness or worsening of abdominal pains  Swelling of the abdomen that is new, acute  Fever of 100F or higher    For urgent or emergent issues, a gastroenterologist can be reached at any hour by calling [phone].   DIET:  We do recommend a small meal at first, but then you may proceed  to your regular diet.  Drink plenty of fluids but you should avoid alcoholic beverages for 24 hours.  ACTIVITY:  You should plan to take it easy for the rest of today and you should NOT DRIVE or use heavy machinery until tomorrow (because of the sedation medicines used during the test).    FOLLOW UP: Our staff will call the number listed on your records the next business day following your procedure to check on you and address any questions or concerns that you may have regarding the information given to you following your procedure. If we do not reach you, we will leave a message.  However, if you are feeling well and you are not experiencing any problems, there is no need to return our call.  We will assume that you have returned to your regular daily activities without incident.  If any biopsies were taken you will be contacted by phone or by letter within the next 1-3 weeks.  Please call us at [phone] if you have not heard about the biopsies in 3 weeks.    SIGNATURES/CONFIDENTIALITY: You and/or your care partner have signed paperwork which will be entered into your electronic medical record.  These signatures attest to the fact that that the information above on your After Visit Summary has been reviewed and is understood.  Full responsibility of the confidentiality of this discharge information lies with you and/or your care-partner.  Resume all medications today including Eliquis. Information given on polyps and hemorrhoids.

## 2016-07-17 NOTE — Progress Notes (Signed)
Called to room to assist during endoscopic procedure.  Patient ID and intended procedure confirmed with present staff. Received instructions for my participation in the procedure from the performing physician.  

## 2016-07-18 ENCOUNTER — Telehealth: Payer: Self-pay | Admitting: *Deleted

## 2016-07-18 NOTE — Telephone Encounter (Signed)
  Follow up Call-  Call back number 07/17/2016  Post procedure Call Back phone  # [phone]  Permission to leave phone message Yes  Some recent data might be hidden     Patient questions:  Do you have a fever, pain , or abdominal swelling? No. Pain Score  0 *  Have you tolerated food without any problems? Yes.    Have you been able to return to your normal activities? Yes.    Do you have any questions about your discharge instructions: Diet   No. Medications  No. Follow up visit  No.  Do you have questions or concerns about your Care? No.  Actions: * If pain score is 4 or above: No action needed, pain <4.

## 2016-07-23 ENCOUNTER — Ambulatory Visit (HOSPITAL_COMMUNITY)
Admission: RE | Admit: 2016-07-23 | Discharge: 2016-07-23 | Disposition: A | Discharge status: Home / Self Care | Payer: Medicare Other | Source: Ambulatory Visit | Attending: Internal Medicine | Admitting: Internal Medicine

## 2016-07-23 DIAGNOSIS — I6523 Occlusion and stenosis of bilateral carotid arteries: Secondary | ICD-10-CM

## 2016-07-26 ENCOUNTER — Other Ambulatory Visit: Payer: Self-pay | Admitting: *Deleted

## 2016-07-26 ENCOUNTER — Encounter: Payer: Self-pay | Admitting: Internal Medicine

## 2016-07-26 DIAGNOSIS — I6523 Occlusion and stenosis of bilateral carotid arteries: Secondary | ICD-10-CM

## 2016-07-26 DIAGNOSIS — Z86010 Personal history of colonic polyps: Secondary | ICD-10-CM

## 2016-07-26 DIAGNOSIS — Z860101 Personal history of adenomatous and serrated colon polyps: Secondary | ICD-10-CM

## 2016-07-26 HISTORY — DX: Personal history of colonic polyps: Z86.010

## 2016-07-26 HISTORY — DX: Personal history of adenomatous and serrated colon polyps: Z86.0101

## 2016-07-26 NOTE — Progress Notes (Signed)
Diminutive adenoma recall colon 2023

## 2016-08-08 ENCOUNTER — Other Ambulatory Visit: Payer: Self-pay | Admitting: Cardiovascular Disease

## 2016-08-17 ENCOUNTER — Encounter: Payer: Self-pay | Admitting: Cardiovascular Disease

## 2016-08-17 ENCOUNTER — Other Ambulatory Visit: Payer: Self-pay | Admitting: Internal Medicine

## 2016-08-17 ENCOUNTER — Ambulatory Visit (INDEPENDENT_AMBULATORY_CARE_PROVIDER_SITE_OTHER): Payer: Medicare Other | Admitting: Cardiovascular Disease

## 2016-08-17 VITALS — BP 130/80 | HR 67 | Ht 65.0 in | Wt 180.8 lb

## 2016-08-17 DIAGNOSIS — I739 Peripheral vascular disease, unspecified: Secondary | ICD-10-CM | POA: Diagnosis not present

## 2016-08-17 DIAGNOSIS — I481 Persistent atrial fibrillation: Secondary | ICD-10-CM

## 2016-08-17 DIAGNOSIS — I25810 Atherosclerosis of coronary artery bypass graft(s) without angina pectoris: Secondary | ICD-10-CM | POA: Diagnosis not present

## 2016-08-17 DIAGNOSIS — I701 Atherosclerosis of renal artery: Secondary | ICD-10-CM

## 2016-08-17 DIAGNOSIS — I6523 Occlusion and stenosis of bilateral carotid arteries: Secondary | ICD-10-CM

## 2016-08-17 DIAGNOSIS — Z7901 Long term (current) use of anticoagulants: Secondary | ICD-10-CM

## 2016-08-17 DIAGNOSIS — E785 Hyperlipidemia, unspecified: Secondary | ICD-10-CM

## 2016-08-17 DIAGNOSIS — I4819 Other persistent atrial fibrillation: Secondary | ICD-10-CM

## 2016-08-17 DIAGNOSIS — R001 Bradycardia, unspecified: Secondary | ICD-10-CM

## 2016-08-17 DIAGNOSIS — Z1231 Encounter for screening mammogram for malignant neoplasm of breast: Secondary | ICD-10-CM

## 2016-08-17 DIAGNOSIS — E1151 Type 2 diabetes mellitus with diabetic peripheral angiopathy without gangrene: Secondary | ICD-10-CM

## 2016-08-17 DIAGNOSIS — R9439 Abnormal result of other cardiovascular function study: Secondary | ICD-10-CM

## 2016-08-17 NOTE — Progress Notes (Signed)
Cardiology Office Note    Date:  08/18/2016   ID:  [name], DOB 1950-11-09, MRN [id number]  PCP:  Tivis Ringer, MD  Cardiologist:   Sanda Klein, MD   Chief Complaint  Patient presents with  . Follow-up    1 year; Pt states no Sx.    History of Present Illness:  [name] is a 66 y.o. female with paroxysmal atrial fibrillation, extensive coronary and peripheral arterial complications of atherosclerosis, with onset early age. She returns for follow-up. She denies any interval changes in her health. She had labs with Dr.Avva and reports that the results were satisfactory.   She presents today again in atrial fibrillation with controlled ventricular weight and is unaware of the arrhythmia. She does not have palpitations. Denies angina or dyspnea with activity, intermittent claudication, new focal neurological complaints, lower extremity edema, bleeding problems.   She states that her typical fasting glucoses in the 120-130 range and that her last hemoglobin A1c was 7%. She does not need insulin. She is on maximum dose of the most potent available statin. Her blood pressure is well controlled. She had a colonoscopy last month for blood in stool by Dr. Carlean Purl, a tiny polyp was removed and internal hemorrhoids were seen, otherwise the study was normal.   A carotid Doppler scan also performed in January showed no change in moderate bilateral disease (40-59% stenosis in right internal carotid, 49-59% stenosis and stented left internal carotid, normal antegrade right vertebral flow, minimal flow left vertebral artery). Her last renal artery duplex was performed in October 2017 and showed patent renal arteries. Last lower extremity arterial Doppler was performed in April 2016 and showed bilateral moderate stenosis in the superficial femoral arteries with three-vessel runoff, ABI 0.69 on right, 0.7 on left.  She had CABG X 3 with LIMA-LAD, SVG-OM,SVG-RCA 2004. Re studied in 2005  and this showed patent grafts, with distal LAD and OM disease. Her Myoview in 7/12 was low risk, but repeat in 11/2013 showed new anterolateral ischemia. She has no good revascularization options in the anterior wall. Although the LIMA anastomosis is patent there is no retrograde flow to the proximal LAD and the distal LAD is diffusely diseased and not amenable to PCI. Her most recent echocardiogram in January 2014 showed normal left ventricular systolic function and regional wall motion. Echocardiogram in January 2014 showed normal left ventricular systolic function and wall motion with an estimated systolic PA pressure of 44 mm Hg  In March of 2013 she had DCCV. She was in normal sinus rhythm in August 2015 in the clinic.  In September 2016, December 2016 and January 2018 she presented with rate controlled asymptomatic atrial fibrillation. She has PAD and has had prior renal and popliteal stenting.  Last lower extremity arterial Doppler was performed in April 2016 and showed bilateral moderate stenosis in the superficial femoral arteries with three-vessel runoff, ABI 0.69 on right, 0.7 on left. She had left carotid stenting 02/12/12 and post op she was hypotensive and bradycardic.  January 2018 [place]: moderate bilateral disease (40-59% stenosis in right internal carotid, 49-59% stenosis and stented left internal carotid, normal antegrade right vertebral flow, minimal flow left vertebral artery) She has hyperlipidemia, diabetes mellitus and hypertension.    Past Medical History:  Diagnosis Date  . Acute kidney failure ([place])   . ASHD (arteriosclerotic heart disease)    native vessel  . Atrial fibrillation ([place])   . CAD (coronary artery disease) 2004   multivessel, status post  CABG x3  . Carotid artery disease ([place])    stent in 2013  . CKD (chronic kidney disease), stage III   . Diabetes ([place])   . GERD (gastroesophageal reflux disease)   . History of tobacco abuse   . Hx of adenomatous polyp  of colon 07/26/2016  . Hyperlipidemia   . Hypertension   . Hyperthyroidism   . Myocardial infarction 2002  . Peripheral vascular disease ([facility])    S/P popliteal artery and renal artery stents  . Renovascular hypertension   . S/P CABG x 3 2004  . Thyroid goiter   . Tobacco abuse   . Vaginal cancer ([place]) 1983    Past Surgical History:  Procedure Laterality Date  . CARDIAC CATHETERIZATION  09/23/2002   3 vessel CAD >> CABG (Dr. Marella Chimes)  . CARDIAC CATHETERIZATION  12/23/2003   patent LIMA, LAD, SVG to OM1, SVG to RCA, distal disease beyond graft insertion (Dr. Marella Chimes)  . CARDIOVERSION  09/11/2011   Procedure: CARDIOVERSION;  Surgeon: Sanda Klein, MD;  Location: [facility];  Service: Cardiovascular;  Laterality: N/A;  . CAROTID ANGIOGRAM Bilateral 02/12/2012   Procedure: CAROTID ANGIOGRAM;  Surgeon: Serafina Mitchell, MD;  Location: [facility] CATH LAB;  Service: Cardiovascular;  Laterality: Bilateral;  . CAROTID DOPPLER  03/2013   right bulb & prox ICA w/50-69% stenosis, L ICA stent open/patent  . CAROTID STENT  02/12/2012   Carotid Stent Insertion, Carotid Angiogram - 9x40 Cordis Precise stent  (Dr. Pierre Bali)  . CAROTID STENT INSERTION N/A 02/12/2012   Procedure: CAROTID STENT INSERTION;  Surgeon: Serafina Mitchell, MD;  Location: [facility] CATH LAB;  Service: Cardiovascular;  Laterality: N/A;  . CORONARY ARTERY BYPASS GRAFT  09/24/2002   LIMA to LAD, SVG to Cfx, SVG to RCA (Dr. Prescott Gum)  . LOWER EXTREMITY ARTERIAL DOPPLER  09/2012   bilateral ABIS (mod arterial insuff at rest); R SFA 70-99% stenosis, L SFA 50-69% stenosis  . NM MYOCAR PERF WALL MOTION  12/2010   persnatine - moderate perfusion defect in apical anterior/apical/basal inferolateral/mid inferolateral/apical lateral walls - EF 65% - low risk  . RENAL ARTERY ANGIOPLASTY  02/11/2003   transluminal angioplasty & stent wtih 4x90mm Genesis balloon expandable stent premounted on Cordis aviatory balloon (Dr. Charlyne Petrin)  . RENAL DOPPLER   09/2012   R prox renal artery @ stent equal/less that 60% diameter reduction; L renal artery 1-59% stenosis  . TRANSTHORACIC ECHOCARDIOGRAM  07/2012   EF 55-65%, mild conc LVH, grade 1 diastolic dysfunction; calcicifed MV annulus, mild MR; LA mild-mod dilated; RA mod dilated; mod TR; PA peak pressure 42mmHg, RVSP increased (pulm htn)    Current Medications: Outpatient Medications Prior to Visit  Medication Sig Dispense Refill  . amLODipine (NORVASC) 10 MG tablet TAKE 1 TABLET BY MOUTH  DAILY 90 tablet 0  . ELIQUIS 5 MG TABS tablet TAKE 1 TABLET(5 MG) BY MOUTH TWICE DAILY 60 tablet 5  . fluticasone (FLONASE) 50 MCG/ACT nasal spray SHAKE LQ AND U 2 SPRAYS IEN QD  11  . isosorbide mononitrate (IMDUR) 30 MG 24 hr tablet Take 1 tablet (30 mg total) by mouth daily. 30 tablet 11  . lansoprazole (PREVACID) 15 MG capsule Take 1 capsule (15 mg total) by mouth daily. 90 capsule 3  . metFORMIN (GLUCOPHAGE) 1000 MG tablet Take 1 tablet (1,000 mg total) by mouth 2 (two) times daily with a meal. 180 tablet 3  . metoprolol (LOPRESSOR) 50 MG tablet TAKE 1 TABLET BY MOUTH  EVERY DAY 90 tablet 0  . nitroGLYCERIN (NITROSTAT) 0.4 MG SL tablet Place 1 tablet (0.4 mg total) under the tongue every 5 (five) minutes as needed for chest pain. 25 tablet 6  . rosuvastatin (CRESTOR) 40 MG tablet Take 1 tablet (40 mg total) by mouth daily. 30 tablet 11  . triamterene-hydrochlorothiazide (MAXZIDE-25) 37.5-25 MG tablet TAKE 1 TABLET BY MOUTH  DAILY 90 tablet 0  . valsartan (DIOVAN) 160 MG tablet TAKE 1 TABLET BY MOUTH 2  TIMES DAILY 120 tablet 0   Facility-Administered Medications Prior to Visit  Medication Dose Route Frequency Provider Last Rate Last Dose  . 0.9 %  sodium chloride infusion  500 mL Intravenous Continuous Gatha Mayer, MD      . dextrose 5 % solution   Intravenous Continuous Gatha Mayer, MD         Allergies:   Patient has no known allergies.   Social History   Social History  . Marital status:  Married    Spouse name: N/A  . Number of children: 2  . Years of education: 12   Occupational History  . retired    Social History Main Topics  . Smoking status: Former Smoker    Packs/day: 1.00    Years: 30.00    Quit date: 07/02/2001  . Smokeless tobacco: Never Used  . Alcohol use 1.2 - 1.8 oz/week    2 - 3 Standard drinks or equivalent per week     Comment: socially  . Drug use: No  . Sexual activity: Not Asked   Other Topics Concern  . None   Social History Narrative  . None     Family History:  The patient's family history includes Deep vein thrombosis in her sister; Diabetes in her brother and sister; Heart disease in her sister; Hyperlipidemia in her sister and son; Hypertension in her mother, sister, and son; Other in her brother and mother.   ROS:   Please see the history of present illness.    ROS All other systems reviewed and are negative.   PHYSICAL EXAM:   VS:  BP 130/80   Pulse 67   Ht 5\' 5"  (1.651 m)   Wt 82 kg (180 lb 12.8 oz)   BMI 30.09 kg/m    GEN: Well nourished, well developed, in no acute distress  HEENT: normal  Neck: no JVD, Faint bilateral carotid bruits, no goiter or masses Cardiac: irregular, early peaking 2/6 systolic ejection murmur heard best in the aortic focus, no diastolic murmurs, rubs, or gallops,no edema  Respiratory:  clear to auscultation bilaterally, normal work of breathing GI: soft, nontender, nondistended, + BS MS: no deformity or atrophy  Skin: warm and dry, no rash Neuro:  Alert and Oriented x 3, Strength and sensation are intact Psych: euthymic mood, full affect  Wt Readings from Last 3 Encounters:  08/17/16 82 kg (180 lb 12.8 oz)  07/17/16 82.6 kg (182 lb)  05/14/16 82.6 kg (182 lb)      Studies/Labs Reviewed:   EKG:  EKG is ordered today.  The ekg ordered today demonstrates Atrial fibrillation, rSR' pattern in V1 consistent with incomplete right bundle branch block, QRS 100 ms, nonspecific ST-T wave changes and  QTc 445 ms  Recent Labs: No results found for requested labs within last 8760 hours.   Lipid Panel    Component Value Date/Time   CHOL 101 11/30/2013 1014   TRIG 70 11/30/2013 1014   HDL 43 11/30/2013 1014  CHOLHDL 2.3 11/30/2013 1014   VLDL 14 11/30/2013 1014   LDLCALC 44 11/30/2013 1014    Additional studies/ records that were reviewed today include:  Records from colonoscopy, requested labs from PCP    ASSESSMENT:    1. Persistent atrial fibrillation ([place])   2. Coronary artery disease involving autologous vein coronary bypass graft without angina pectoris   3. Bilateral carotid artery stenosis   4. PVD (peripheral vascular disease) ([place])   5. Right renal artery stenosis s/p stent   6. Dyslipidemia   7. Type 2 diabetes mellitus with diabetic peripheral angiopathy without gangrene, without long-term current use of insulin ([place])   8. Chronic anticoagulation      PLAN:  In order of problems listed above:  1. AFib: This is the third consecutive visit with asymptomatic atrial fibrillation (starting in Sep 2016), it's quite possible she now has long-term persistent atrial fibrillation, possibly permanent. There rate is well controlled and she is appropriately anticoagulated. Further attempts at cardioversion or antiarrhythmic therapy does not appear indicated. 2. CAD: She is known to have anterolateral ischemia by nuclear stress testing (patent LIMA to LAD but with occlusion of the LAD without retrograde flow to proximal vessel, diffuse distal LAD disease), but does not have angina therapy and is best suited for medical treatment. There are no good options for revascularization. 3. Carotid stenosis: Asymptomatic moderate bilateral disease with previous stent on the left side. 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 There is also 50% stenosis in the  M1 segment of the left middle cerebral artery. No neurological complaints.  4. PAD: Currently appears to be free of intermittent claudication. Has moderate reduction in bilateral arterial flow (R ABI 0.69, LABI 0.79). Encouraged to keep walking to help promote collateral formation. 5. S/P R renal stent: Excellent blood pressure control, stent patent by last ultrasound in 2016, plan to repeat only if there is a change in hemodynamics or renal function. 6. HLP: On very high-dose rosuvastatin with excellent LDL at last check. We'll get the most recent results from PCP. 7. DM: Well controlled, last A1c 7% 8. Eliquis: Benign findings of colonoscopy. CHADSVasc 5 (age, gender, diabetes mellitus, hypertension, vascular disease). No serious bleeding complications.   Medication Adjustments/Labs and Tests Ordered: Current medicines are reviewed at length with the patient today.  Concerns regarding medicines are outlined above.  Medication changes, Labs and Tests ordered today are listed in the Patient Instructions below. Patient Instructions  Dr Sallyanne Kuster recommends that you schedule a follow-up appointment in 12 months. You will receive a reminder letter in the mail two months in advance. If you don't receive a letter, please call our office to schedule the follow-up appointment.  If you need a refill on your cardiac medications before your next appointment, please call your pharmacy.    Signed, Sanda Klein, MD  08/18/2016 10:34 AM    [facility] [address], [place], [place]  [address] Phone: [phone]; Fax: [phone]

## 2016-08-17 NOTE — Patient Instructions (Signed)
Dr Croitoru recommends that you schedule a follow-up appointment in 12 months. You will receive a reminder letter in the mail two months in advance. If you don't receive a letter, please call our office to schedule the follow-up appointment.  If you need a refill on your cardiac medications before your next appointment, please call your pharmacy. 

## 2016-09-27 ENCOUNTER — Other Ambulatory Visit: Payer: Self-pay | Admitting: Cardiovascular Disease

## 2016-10-06 ENCOUNTER — Other Ambulatory Visit: Payer: Self-pay | Admitting: Cardiovascular Disease

## 2016-10-08 NOTE — Telephone Encounter (Signed)
Need recent lab work (CBC and BMET)

## 2016-10-25 ENCOUNTER — Other Ambulatory Visit: Payer: Self-pay | Admitting: Cardiovascular Disease

## 2016-11-13 ENCOUNTER — Other Ambulatory Visit: Payer: Self-pay | Admitting: Cardiovascular Disease

## 2016-11-15 ENCOUNTER — Other Ambulatory Visit: Payer: Self-pay | Admitting: Cardiovascular Disease

## 2017-04-04 ENCOUNTER — Other Ambulatory Visit: Payer: Self-pay | Admitting: Internal Medicine

## 2017-04-04 DIAGNOSIS — R921 Mammographic calcification found on diagnostic imaging of breast: Secondary | ICD-10-CM

## 2017-04-11 ENCOUNTER — Other Ambulatory Visit: Payer: Medicare Other

## 2017-05-16 ENCOUNTER — Other Ambulatory Visit: Payer: Self-pay | Admitting: Cardiovascular Disease

## 2017-05-27 ENCOUNTER — Other Ambulatory Visit: Payer: Medicare Other

## 2017-06-10 ENCOUNTER — Other Ambulatory Visit: Payer: Medicare Other

## 2017-06-21 ENCOUNTER — Other Ambulatory Visit: Payer: Self-pay | Admitting: Cardiovascular Disease

## 2017-06-21 ENCOUNTER — Other Ambulatory Visit: Payer: Self-pay

## 2017-06-21 MED ORDER — APIXABAN 5 MG PO TABS: ORAL_TABLET | ORAL | 1 refills | Status: DC

## 2017-07-03 ENCOUNTER — Ambulatory Visit: Admission: RE | Admit: 2017-07-03 | Payer: Medicare Other | Source: Ambulatory Visit

## 2017-07-03 ENCOUNTER — Ambulatory Visit
Admission: RE | Admit: 2017-07-03 | Discharge: 2017-07-03 | Disposition: A | Discharge status: Home / Self Care | Payer: Medicare Other | Source: Ambulatory Visit | Attending: Internal Medicine | Admitting: Internal Medicine

## 2017-07-03 ENCOUNTER — Other Ambulatory Visit: Payer: Self-pay | Admitting: Internal Medicine

## 2017-07-03 DIAGNOSIS — R921 Mammographic calcification found on diagnostic imaging of breast: Secondary | ICD-10-CM

## 2017-07-03 DIAGNOSIS — N6489 Other specified disorders of breast: Secondary | ICD-10-CM

## 2017-07-05 ENCOUNTER — Ambulatory Visit
Admission: RE | Admit: 2017-07-05 | Discharge: 2017-07-05 | Disposition: A | Discharge status: Home / Self Care | Payer: Medicare Other | Source: Ambulatory Visit | Attending: Internal Medicine | Admitting: Internal Medicine

## 2017-07-05 ENCOUNTER — Other Ambulatory Visit: Payer: Self-pay | Admitting: Internal Medicine

## 2017-07-05 DIAGNOSIS — N6489 Other specified disorders of breast: Secondary | ICD-10-CM

## 2017-07-10 ENCOUNTER — Encounter: Payer: Self-pay | Admitting: *Deleted

## 2017-07-10 ENCOUNTER — Telehealth: Payer: Self-pay | Admitting: Hematology

## 2017-07-10 DIAGNOSIS — D0512 Intraductal carcinoma in situ of left breast: Secondary | ICD-10-CM

## 2017-07-10 NOTE — Telephone Encounter (Signed)
Confirmed afternoon [facility] appointment for 07/17/17, packet will e mail per patients request

## 2017-07-16 NOTE — Progress Notes (Signed)
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      07/05/2017 Initial Biopsy    Diagnosis Breast, left, needle core biopsy, 9:00 o'clock - HIGH GRADE DUCTAL CARCINOMA IN SITU WITH FOCI SUSPICIOUS FOR MICROINVASION - NECROSIS AND CALCIFICATIONS - SEE COMMENT Microscopic Comment Based on the biopsy, the ductal carcinoma in situ has a comedo pattern, high nuclear grade with marked nuclear pleomorphism and measures 1.0 cm in greatest linear extent. There are foci suspicious for microinvasion. Prognostic markers (ER/PR) are pending and will be reported in an addendum. Dr. Tresa Moore has reviewed the case and agrees with the diagnosis. The results were called to [facility] on July 08, 2017.      07/05/2017 Receptors her2    Estrogen Receptor: 0%, NEGATIVE Progesterone Receptor: 0%, NEGATIVE      07/10/2017 Initial Diagnosis    Ductal carcinoma in situ (DCIS) of left breast        HISTORY OF PRESENTING ILLNESS:  [name] 67 y.o. 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 The patient presents to the clinic today accompanied by her sister.  She states she did not feel a lump, or notice any breast changes. Her  cancer was found on her screening mammogram. Her last mammogram before this was in 2012. Her mammogram on 07/03/17 that revealed a 4 cm mass containing pleomorphism calcifications. Her initial biopsy on 07/05/17 revealed high grade ductal carcinoma in situ with foci suspicious for microinvasion. There is calcification and necrosis as well.   She denies any chest pain, trouble swallowing, SOB or breast discomfort currently.   She has a MHx of HTN, DM, CAD, HLD, and hyperthyroism that she takes daily medication for. Her HTN and HLD is controlled. Pt has a hx of partial hysterectomy 30 years ago and a carotid stent insertion in 2013. She sees her cardiologist, Dr. Sallyanne Kuster regularly and her latest ECHO reveals nothing of concern. She has a Hx of vaginal cancer. She has no FHx of CA. Pt has not used tobacco in 10 years. She does not drink.    Socially she lives with her husband and is retired.  GYN HISTORY  Menarchal: 13 LMP: 35 due to hysterectomy Contraceptive: HRT: No GP: 2/2, first birth at 40   MEDICAL HISTORY:  Past Medical History:  Diagnosis Date  . Acute kidney failure ([place])   . ASHD (arteriosclerotic heart disease)    native vessel  . Atrial fibrillation ([place])   . CAD (coronary artery disease) 2004   multivessel, status post CABG x3  . Carotid artery disease ([place])    stent in 2013  . CKD (chronic kidney disease), stage III ([place])   . Diabetes ([place])   . Diabetes ([place])   .  GERD (gastroesophageal reflux disease)   . History of tobacco abuse   . Hx of adenomatous polyp of colon 07/26/2016  . Hyperlipidemia   . Hypertension   . Hyperthyroidism   . Myocardial infarction ([place]) 2002  . Peripheral vascular disease ([facility])    S/P popliteal artery and renal artery stents  . Renovascular hypertension   . S/P CABG x 3 2004  . Thyroid goiter   . Tobacco abuse   . Vaginal cancer ([place]) 1983    SURGICAL HISTORY: Past Surgical History:  Procedure Laterality Date  . CARDIAC  CATHETERIZATION  09/23/2002   3 vessel CAD >> CABG (Dr. Marella Chimes)  . CARDIAC CATHETERIZATION  12/23/2003   patent LIMA, LAD, SVG to OM1, SVG to RCA, distal disease beyond graft insertion (Dr. Marella Chimes)  . CARDIOVERSION  09/11/2011   Procedure: CARDIOVERSION;  Surgeon: Sanda Klein, MD;  Location: [facility];  Service: Cardiovascular;  Laterality: N/A;  . CAROTID ANGIOGRAM Bilateral 02/12/2012   Procedure: CAROTID ANGIOGRAM;  Surgeon: Serafina Mitchell, MD;  Location: [facility] CATH LAB;  Service: Cardiovascular;  Laterality: Bilateral;  . CAROTID DOPPLER  03/2013   right bulb & prox ICA w/50-69% stenosis, L ICA stent open/patent  . CAROTID STENT  02/12/2012   Carotid Stent Insertion, Carotid Angiogram - 9x40 Cordis Precise stent  (Dr. Pierre Bali)  . CAROTID STENT INSERTION N/A 02/12/2012   Procedure: CAROTID STENT INSERTION;  Surgeon: Serafina Mitchell, MD;  Location: [facility] CATH LAB;  Service: Cardiovascular;  Laterality: N/A;  . CORONARY ARTERY BYPASS GRAFT  09/24/2002   LIMA to LAD, SVG to Cfx, SVG to RCA (Dr. Prescott Gum)  . LOWER EXTREMITY ARTERIAL DOPPLER  09/2012   bilateral ABIS (mod arterial insuff at rest); R SFA 70-99% stenosis, L SFA 50-69% stenosis  . NM MYOCAR PERF WALL MOTION  12/2010   persnatine - moderate perfusion defect in apical anterior/apical/basal inferolateral/mid inferolateral/apical lateral walls - EF 65% - low risk  . RENAL ARTERY ANGIOPLASTY  02/11/2003   transluminal angioplasty & stent wtih 4x26m Genesis balloon expandable stent premounted on Cordis aviatory balloon (Dr. RCharlyne Petrin  . RENAL DOPPLER  09/2012   R prox renal artery @ stent equal/less that 60% diameter reduction; L renal artery 1-59% stenosis  . TRANSTHORACIC ECHOCARDIOGRAM  07/2012   EF 55-65%, mild conc LVH, grade 1 diastolic dysfunction; calcicifed MV annulus, mild MR; LA mild-mod dilated; RA mod dilated; mod TR; PA peak pressure 444mg, RVSP increased (pulm htn)    SOCIAL HISTORY: Social History    Socioeconomic History  . Marital status: Married    Spouse name: Not on file  . Number of children: 2  . Years of education: 1227. Highest education level: Not on file  Social Needs  . Financial resource strain: Not on file  . Food insecurity - worry: Not on file  . Food insecurity - inability: Not on file  . Transportation needs - medical: Not on file  . Transportation needs - non-medical: Not on file  Occupational History  . Occupation: retired  Tobacco Use  . Smoking status: Former Smoker    Packs/day: 1.00    Years: 30.00    Pack years: 30.00    Last attempt to quit: 07/02/2001    Years since quitting: 16.0  . Smokeless tobacco: Never Used  Substance and Sexual Activity  . Alcohol use: Yes    Alcohol/week: 1.2 - 1.8 oz    Types: 2 - 3 Standard drinks or equivalent per  week    Frequency: Never    Comment: socially  . Drug use: No  . Sexual activity: Not on file  Other Topics Concern  . Not on file  Social History Narrative  . Not on file    FAMILY HISTORY: Family History  Problem Relation Age of Onset  . Hypertension Mother   . Other Mother        DJD  . Deep vein thrombosis Sister   . Diabetes Sister   . Heart disease Sister   . Hyperlipidemia Sister   . Hypertension Sister   . Diabetes Brother   . Other Brother        DJD  . Hypertension Son   . Hyperlipidemia Son   . Colon cancer Neg Hx   . Stomach cancer Neg Hx   . Esophageal cancer Neg Hx   . Rectal cancer Neg Hx   . Liver cancer Neg Hx     ALLERGIES:  has No Known Allergies.  MEDICATIONS:  Current Outpatient Medications  Medication Sig Dispense Refill  . amLODipine (NORVASC) 10 MG tablet TAKE 1 TABLET BY MOUTH  DAILY 90 tablet 0  . apixaban (ELIQUIS) 5 MG TABS tablet TAKE 1 TABLET(5 MG) BY MOUTH TWICE DAILY 60 tablet 1  . lansoprazole (PREVACID) 15 MG capsule Take 1 capsule (15 mg total) by mouth daily. 90 capsule 3  . metFORMIN (GLUCOPHAGE) 1000 MG tablet Take 1 tablet (1,000 mg total) by  mouth 2 (two) times daily with a meal. 180 tablet 3  . methimazole (TAPAZOLE) 10 MG tablet Take 10 mg by mouth 2 (two) times daily.  5  . metoprolol tartrate (LOPRESSOR) 50 MG tablet TAKE 1 TABLET BY MOUTH  EVERY DAY 90 tablet 0  . rosuvastatin (CRESTOR) 40 MG tablet TAKE 1 TABLET BY MOUTH  DAILY 90 tablet 0  . triamterene-hydrochlorothiazide (MAXZIDE-25) 37.5-25 MG tablet TAKE 1 TABLET BY MOUTH  DAILY 90 tablet 0  . valsartan (DIOVAN) 160 MG tablet TAKE 1 TABLET BY MOUTH TWO  TIMES DAILY 180 tablet 0  . nitroGLYCERIN (NITROSTAT) 0.4 MG SL tablet Place 1 tablet (0.4 mg total) under the tongue every 5 (five) minutes as needed for chest pain. (Patient not taking: Reported on 07/17/2017) 25 tablet 6   Current Facility-Administered Medications  Medication Dose Route Frequency Provider Last Rate Last Dose  . 0.9 %  sodium chloride infusion  500 mL Intravenous Continuous Gatha Mayer, MD      . dextrose 5 % solution   Intravenous Continuous Gatha Mayer, MD        REVIEW OF SYSTEMS:   Constitutional: Denies fevers, chills or abnormal night sweats Eyes: Denies blurriness of vision, double vision or watery eyes Ears, nose, mouth, throat, and face: Denies mucositis or sore throat Respiratory: Denies cough, dyspnea or wheezes Cardiovascular: Denies palpitation, chest discomfort or lower extremity swelling Gastrointestinal:  Denies nausea, heartburn or change in bowel habits Skin: Denies abnormal skin rashes Lymphatics: Denies new lymphadenopathy or easy bruising Neurological:Denies numbness, tingling or new weaknesses Behavioral/Psych: Mood is stable, no new changes  All other systems were reviewed with the patient and are negative.  PHYSICAL EXAMINATION: ECOG PERFORMANCE STATUS: 0 - Asymptomatic  Vitals:   07/17/17 1328  BP: 108/74  Pulse: (!) 58  Resp: 18  Temp: 98.2 F (36.8 C)  SpO2: 100%   There were no vitals filed for this visit.  GENERAL:alert, no distress and  comfortable SKIN: skin color, texture, turgor are normal, no rashes  or significant lesions EYES: normal, conjunctiva are pink and non-injected, sclera clear OROPHARYNX:no exudate, no erythema and lips, buccal mucosa, and tongue normal  NECK: supple, thyroid normal size, non-tender, without nodularity LYMPH:  no palpable lymphadenopathy in the cervical, axillary or inguinal LUNGS: clear to auscultation and percussion with normal breathing effort HEART: regular rate & rhythm and no murmurs and no lower extremity edema ABDOMEN:abdomen soft, non-tender and normal bowel sounds Musculoskeletal:no cyanosis of digits and no clubbing  PSYCH: alert & oriented x 3 with fluent speech NEURO: no focal motor/sensory deficits BREAST: 2.5. by 2 cm mass at 9 o/clock position on the left breast.   LABORATORY DATA:  I have reviewed the data as listed CBC Latest Ref Rng & Units 07/17/2017 03/31/2013 02/13/2012  WBC 3.9 - 10.3 K/uL 5.8 6.0 7.7  Hemoglobin 12.0 - 15.0 g/dL - 13.5 12.1  Hematocrit 34.8 - 46.6 % 32.5(L) 39.1 35.8(L)  Platelets 145 - 400 K/uL 245 278 215    CMP Latest Ref Rng & Units 07/17/2017 11/30/2013 03/31/2013  Glucose 70 - 140 mg/dL 109 145(H) 137(H)  BUN 7 - 26 mg/dL 23 24(H) 25(H)  Creatinine 0.50 - 1.10 mg/dL - 1.22(H) 1.60(H)  Sodium 136 - 145 mmol/L 142 138 141  Potassium 3.3 - 4.7 mmol/L 4.0 3.7 4.0  Chloride 98 - 109 mmol/L 103 103 100  CO2 22 - 29 mmol/L 30(H) 25 32  Calcium 8.4 - 10.4 mg/dL 10.0 9.6 10.0  Total Protein 6.4 - 8.3 g/dL 7.4 6.9 7.3  Total Bilirubin 0.2 - 1.2 mg/dL 0.5 0.5 0.5  Alkaline Phos 40 - 150 U/L 93 65 61  AST 5 - 34 U/L '18 14 15  ' ALT 0 - 55 U/L '15 10 9   ' PATHOLOGY Initial Biopsy 07/05/17 Diagnosis Breast, left, needle core biopsy, 9:00 o'clock - HIGH GRADE DUCTAL CARCINOMA IN SITU WITH FOCI SUSPICIOUS FOR MICROINVASION - NECROSIS AND CALCIFICATIONS - SEE COMMENT Microscopic Comment Based on the biopsy, the ductal carcinoma in situ has a comedo  pattern, high nuclear grade with marked nuclear pleomorphism and measures 1.0 cm in greatest linear extent. There are foci suspicious for microinvasion. Prognostic markers (ER/PR) are pending and will be reported in an addendum. Dr. Tresa Moore has reviewed the case and agrees with the diagnosis. The results were called to [facility] on July 08, 2017. Results: IMMUNOHISTOCHEMICAL AND MORPHOMETRIC ANALYSIS PERFORMED MANUALLY Estrogen Receptor: 0%, NEGATIVE Progesterone Receptor: 0%, NEGATIVE  RADIOGRAPHIC STUDIES: I have personally reviewed the radiological images as listed and agreed with the findings in the report. US Breast Ltd Uni Left Inc Axilla  Result Date: 07/03/2017 CLINICAL DATA:  Follow-up right breast mass EXAM: 2D DIGITAL DIAGNOSTIC BILATERAL MAMMOGRAM WITH CAD AND ADJUNCT TOMO ULTRASOUND LEFT BREAST COMPARISON:  Previous exam(s). ACR Breast Density Category b: There are scattered areas of fibroglandular density. FINDINGS: The mass in the lateral right breast, followed in 2012, is stable mammographically. Multiple masses on the right are stable. There is a new focal asymmetry in the medial inferior left breast containing the amorphic calcifications. This focal asymmetry is irregular in shape and measures up to 1.8 cm. No other suspicious findings. Mammographic images were processed with CAD. On physical exam, no suspicious lumps are identified. Targeted ultrasound is performed, showing a mass in the left breast at 9 o'clock, 4 cm from the nipple measuring 1.4 x 1.9 x 1.2 cm. This mass contains calcifications in correlates with the mammographic finding. No adenopathy. IMPRESSION: New mass in the left  breast at 9 o'clock, 4 cm from the nipple containing pleomorphism calcifications. RECOMMENDATION: Recommend ultrasound-guided biopsy of the new left breast mass. I have discussed the findings and recommendations with the patient. Results were also provided in writing at the  conclusion of the visit. If applicable, a reminder letter will be sent to the patient regarding the next appointment. BI-RADS CATEGORY  4: Suspicious. Electronically Signed   By: Dorise Bullion III M.D   On: 07/03/2017 12:45   Mm Diag Breast Tomo Bilateral  Result Date: 07/03/2017 CLINICAL DATA:  Follow-up right breast mass EXAM: 2D DIGITAL DIAGNOSTIC BILATERAL MAMMOGRAM WITH CAD AND ADJUNCT TOMO ULTRASOUND LEFT BREAST COMPARISON:  Previous exam(s). ACR Breast Density Category b: There are scattered areas of fibroglandular density. FINDINGS: The mass in the lateral right breast, followed in 2012, is stable mammographically. Multiple masses on the right are stable. There is a new focal asymmetry in the medial inferior left breast containing the amorphic calcifications. This focal asymmetry is irregular in shape and measures up to 1.8 cm. No other suspicious findings. Mammographic images were processed with CAD. On physical exam, no suspicious lumps are identified. Targeted ultrasound is performed, showing a mass in the left breast at 9 o'clock, 4 cm from the nipple measuring 1.4 x 1.9 x 1.2 cm. This mass contains calcifications in correlates with the mammographic finding. No adenopathy. IMPRESSION: New mass in the left breast at 9 o'clock, 4 cm from the nipple containing pleomorphism calcifications. RECOMMENDATION: Recommend ultrasound-guided biopsy of the new left breast mass. I have discussed the findings and recommendations with the patient. Results were also provided in writing at the conclusion of the visit. If applicable, a reminder letter will be sent to the patient regarding the next appointment. BI-RADS CATEGORY  4: Suspicious. Electronically Signed   By: Dorise Bullion III M.D   On: 07/03/2017 12:45   Mm Clip Placement Left  Result Date: 07/05/2017 CLINICAL DATA:  Evaluate biopsy marker EXAM: DIAGNOSTIC LEFT MAMMOGRAM POST ULTRASOUND BIOPSY COMPARISON:  Previous exam(s). FINDINGS: Mammographic  images were obtained following ultrasound guided biopsy of a left breast mass with calcifications. The ribbon shaped clip is along the lateral margin of the biopsied mass/calcifications. IMPRESSION: Clip placement as above. Final Assessment: Post Procedure Mammograms for Marker Placement Electronically Signed   By: Dorise Bullion III M.D   On: 07/05/2017 13:36   [place] Lt Breast Bx W Loc Dev 1st Lesion Img Bx Spec US Guide  Addendum Date: 07/09/2017   ADDENDUM REPORT: 07/08/2017 14:22 ADDENDUM: Pathology revealed HIGH GRADE DUCTAL CARCINOMA IN SITU WITH FOCI SUSPICIOUS FOR MICROINVASION, NECROSIS AND CALCIFICATIONS of the Left breast, 9:00 o'clock. This was found to be concordant by Dr. Jimmye Norman. Pathology results were discussed with the patient by telephone. The patient reported doing well after the biopsy with tenderness at the site. Post biopsy instructions and care were reviewed and questions were answered. The patient was encouraged to call [facility] for any additional concerns. The patient was referred to [facility] on July 17, 2017. Pathology results reported by Terie Purser, RN on 07/08/2017. Electronically Signed   By: Dorise Bullion III M.D   On: 07/08/2017 14:22   Result Date: 07/09/2017 CLINICAL DATA:  Biopsy of left breast mass/calcifications EXAM: ULTRASOUND GUIDED LEFT BREAST CORE NEEDLE BIOPSY COMPARISON:  Previous exam(s). FINDINGS: I met with the patient and we discussed the procedure of ultrasound-guided biopsy, including benefits and alternatives. We  discussed the high likelihood of a successful procedure. We discussed the risks of the procedure, including infection, bleeding, tissue injury, clip migration, and inadequate sampling. Informed written consent was given. The usual time-out protocol was performed immediately prior to the procedure. Lesion quadrant: Lower inner Using sterile  technique and 1% Lidocaine as local anesthetic, under direct ultrasound visualization, a 12 gauge spring-loaded device was used to perform biopsy of the medial left breast mass/calcifications using a medial approach. At the conclusion of the procedure a ribbon shaped tissue marker clip was deployed into the biopsy cavity. Follow up 2 view mammogram was performed and dictated separately. IMPRESSION: Ultrasound guided biopsy of the left breast mass/calcifications. No apparent complications. Electronically Signed: By: Dorise Bullion III M.D On: 07/05/2017 13:37    ASSESSMENT & PLAN:  [name] is a 67 y.o. Female with ductal carcinoma is situ of left breast  1. 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 -She is a candidate for breast conservation surgery. She has been seen by breast surgeon Dr. Marlou Starks, who recommends lumpectomy.  Due to the suspicion of microinvasion, she will have sentinel lymph node biopsy also. -Her DCIS will be cured by complete surgical resection. Any form of adjuvant therapy is preventive. -Given her negative ER and PR, I do not recommend antiestrogen therapy  -We also discussed that biopsy may have sampling limitation, we will review her surgical path, to see if she has any invasive carcinoma components.  We discussed the role of adjuvant chemotherapy for invasive triple negative breast cancer, if the tumor is more than 5 mm.  -She was also seen by radiation oncologist Dr. Sondra Come today. She will likely proceed with radiation after her lumpectomy to reduce her risk of local recurrence. -We also discussed the breast cancer surveillance after her surgery. She will continue annual screening mammogram, self exam, and a routine office visit with lab and exam with [place]. -I encouraged her to have healthy diet and exercise regularly -Labs today (07/17/17) reveal her Hgb is 10.4 and her  HCT is 32.5 which is mildly anemic. Her Cr is 2.08 -She knows to call with any questions or concerns   2. HTN, DM, CKD, CAD s/p CABG -She has multiple comorbidities, will continue follow-up with her primary care physician and her cardiologist.   Plan -She will likely have lumpectomy and sentinel lymph node biopsy soon -I'll see her after adjuvant breast radiation, or sooner after her surgery if her surgical pathology reveals invasive carcinoma.   No orders of the defined types were placed in this encounter.   All questions were answered. The patient knows to call the clinic with any problems, questions or concerns. I spent 40 minutes counseling the patient face to face. The total time spent in the appointment was 45 minutes and more than 50% was on counseling.  This document serves as a record of services personally performed by Truitt Merle, MD. It was created on her behalf by Theresia Bough, a trained medical scribe. The creation of this record is based on the scribe's personal observations and the provider's statements to them.   I have reviewed the above documentation for accuracy and completeness, and I agree with the above.      Truitt Merle, MD 07/17/2017 4:37 PM

## 2017-07-16 NOTE — Progress Notes (Signed)
Radiation Oncology         [phone] ________________________________  Initial outpatient Consultation  Name: [name] MRN: [id number]  Date: 07/17/2017  DOB: 20-Mar-1951  ZJ:IRCV, Steva Ready, MD  Prince Solian, MD   REFERRING PHYSICIAN: Prince Solian, MD  DIAGNOSIS: The encounter diagnosis was Ductal carcinoma in situ (DCIS) of left breast. Stage 0 (cTis (DCIS), cN0, cM0, G3, ER: Negative, PR: Negative, HER2: Unknown) high-grade   HISTORY OF PRESENT ILLNESS:[name] 67 y.o. 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 The patient presents to the clinic today accompanied by her sister.  She states she did not feel a lump, or notice any breast changes. Her cancer was found on her screening mammogram. Her last mammogram before this was in 2012. Her mammogram on 07/03/17 that revealed a an area of distortion in the medial aspect of the breast at the 9:00 position. Ultrasound of this area revealed a 1.4 x 1.9 x 1.2 cm mass containing calcifications correlated with the mammographic finding.   Her initial biopsy on 07/05/17 revealed high grade ductal carcinoma in situ with foci suspicious for microinvasion. There is calcification and necrosis as well.   She denies any chest pain, trouble swallowing, SOB or breast discomfort currently.   She has a MHx of HTN, DM, CAD, HLD, and hyperthyroism that she takes daily medication for. Her HTN and HLD is controlled. Pt has a hx of partial hysterectomy 30 years ago and a carotid stent insertion in 2013. She sees her cardiologist, Dr. Sallyanne [name] regularly and her latest ECHO reveals nothing of concern. She has a Hx of vaginal cancer. She has no FHx of CA. Pt has not used tobacco in 10 years. She does not drink.    Socially she lives with her husband and is retired.  GYN HISTORY  Menarchal: 73 LMP: 35 due to hysterectomy Contraceptive: HRT: No GP: 2/2, first birth at 37      PREVIOUS RADIATION THERAPY:  No  PAST MEDICAL HISTORY:  has a past medical history of Acute kidney failure ([place]), ASHD (arteriosclerotic heart disease), Atrial fibrillation ([place]), CAD (coronary artery disease) (2004), Carotid artery disease ([place]), CKD (chronic kidney disease), stage III, Diabetes ([place]), GERD (gastroesophageal reflux disease), History of tobacco abuse, adenomatous polyp of colon (07/26/2016), Hyperlipidemia, Hypertension, Hyperthyroidism, Myocardial infarction (2002), Peripheral vascular disease ([place]), Renovascular hypertension, S/P CABG x 3 (2004), Thyroid goiter, Tobacco abuse, and 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    PAST SURGICAL HISTORY: Past Surgical History:  Procedure Laterality Date  . CARDIAC CATHETERIZATION  09/23/2002   3 vessel CAD >> CABG (Dr. Marella Chimes)  . CARDIAC CATHETERIZATION  12/23/2003   patent LIMA, LAD, SVG to OM1, SVG to RCA, distal disease beyond graft insertion (Dr. Marella Chimes)  . CARDIOVERSION  09/11/2011   Procedure: CARDIOVERSION;  Surgeon: Sanda Klein, MD;  Location: [place];  Service: Cardiovascular;  Laterality: N/A;  . CAROTID ANGIOGRAM Bilateral 02/12/2012   Procedure: CAROTID ANGIOGRAM;  Surgeon: Serafina Mitchell, MD;  Location: [facility] C[facility] LAB;  Service: Cardiovascular;  Laterality: Bilateral;  . CAROTID DOPPLER  03/2013   right bulb & prox ICA w/50-69% stenosis, L ICA stent open/patent  . CAROTID STENT  02/12/2012   Carotid Stent Insertion, Carotid Angiogram - 9x40 Cordis Precise stent  (Dr. Pierre Bali)  . CAROTID STENT INSERTION N/A 02/12/2012   Procedure: CAROTID STENT INSERTION;  Surgeon: Serafina Mitchell, MD;  Location: [facility] CATH LAB;  Service: Cardiovascular;  Laterality: N/A;  .  CORONARY ARTERY BYPASS GRAFT  09/24/2002   LIMA to LAD, SVG to Cfx, SVG to RCA (Dr. Prescott Gum)  . LOWER EXTREMITY ARTERIAL DOPPLER  09/2012   bilateral ABIS (mod arterial insuff at rest); R SFA 70-99% stenosis, L SFA 50-69% stenosis  . NM MYOCAR PERF WALL MOTION  12/2010   persnatine - moderate perfusion  defect in apical anterior/apical/basal inferolateral/mid inferolateral/apical lateral walls - EF 65% - low risk  . RENAL ARTERY ANGIOPLASTY  02/11/2003   transluminal angioplasty & stent wtih 4x55m Genesis balloon expandable stent premounted on Cordis aviatory balloon (Dr. RCharlyne Petrin  . RENAL DOPPLER  09/2012   R prox renal artery @ stent equal/less that 60% diameter reduction; L renal artery 1-59% stenosis  . TRANSTHORACIC ECHOCARDIOGRAM  07/2012   EF 55-65%, mild conc LVH, grade 1 diastolic dysfunction; calcicifed MV annulus, mild MR; LA mild-mod dilated; RA mod dilated; mod TR; PA peak pressure 410mg, RVSP increased (pulm htn)    FAMILY HISTORY: family history includes Deep vein thrombosis in her sister; Diabetes in her brother and sister; Heart disease in her sister; Hyperlipidemia in her sister and son; Hypertension in her mother, sister, and son; Other in her brother and mother.  SOCIAL HISTORY:  reports that she quit smoking about 16 years ago. She has a 30.00 pack-year smoking history. she has never used smokeless tobacco. She reports that she drinks about 1.2 - 1.8 oz of alcohol per week. She reports that she does not use drugs.  ALLERGIES: Patient has no known allergies.  MEDICATIONS:  Current Outpatient Medications  Medication Sig Dispense Refill  . amLODipine (NORVASC) 10 MG tablet TAKE 1 TABLET BY MOUTH  DAILY 90 tablet 0  . apixaban (ELIQUIS) 5 MG TABS tablet TAKE 1 TABLET(5 MG) BY MOUTH TWICE DAILY 60 tablet 1  . ELIQUIS 5 MG TABS tablet TAKE 1 TABLET(5 MG) BY MOUTH TWICE DAILY 180 tablet 0  . fluticasone (FLONASE) 50 MCG/ACT nasal spray SHAKE LQ AND U 2 SPRAYS IEN QD  11  . isosorbide mononitrate (IMDUR) 30 MG 24 hr tablet Take 1 tablet (30 mg total) by mouth daily. 30 tablet 11  . lansoprazole (PREVACID) 15 MG capsule Take 1 capsule (15 mg total) by mouth daily. 90 capsule 3  . metFORMIN (GLUCOPHAGE) 1000 MG tablet Take 1 tablet (1,000 mg total) by mouth 2 (two) times daily  with a meal. 180 tablet 3  . metoprolol tartrate (LOPRESSOR) 50 MG tablet TAKE 1 TABLET BY MOUTH  EVERY DAY 90 tablet 0  . nitroGLYCERIN (NITROSTAT) 0.4 MG SL tablet Place 1 tablet (0.4 mg total) under the tongue every 5 (five) minutes as needed for chest pain. 25 tablet 6  . rosuvastatin (CRESTOR) 40 MG tablet TAKE 1 TABLET BY MOUTH  DAILY 90 tablet 0  . triamterene-hydrochlorothiazide (MAXZIDE-25) 37.5-25 MG tablet TAKE 1 TABLET BY MOUTH  DAILY 90 tablet 0  . valsartan (DIOVAN) 160 MG tablet TAKE 1 TABLET BY MOUTH TWO  TIMES DAILY 180 tablet 0   Current Facility-Administered Medications  Medication Dose Route Frequency Provider Last Rate Last Dose  . 0.9 %  sodium chloride infusion  500 mL Intravenous Continuous GeGatha MayerMD      . dextrose 5 % solution   Intravenous Continuous GeGatha MayerMD        REVIEW OF SYSTEMS: REVIEW OF SYSTEMS: A 10+ POINT REVIEW OF SYSTEMS WAS OBTAINED including neurology, dermatology, psychiatry, cardiac, respiratory, lymph, extremities, GI, GU, musculoskeletal, constitutional, reproductive, HEENT. All  pertinent positives are noted in the HPI. All others are negative.  PHYSICAL EXAM:  Vitals:   07/17/17 1328  BP: 108/74  Pulse: (!) 58  Resp: 18  Temp: 98.2 F (36.8 C)  SpO2: 100%     General: Alert and oriented, in no acute distress HEENT: Head is normocephalic. Extraocular movements are intact. Oropharynx is clear. Neck: Neck is supple, no palpable cervical or supraclavicular lymphadenopathy. Heart: irregular rhythm consistent with atrial fibrillation Chest: Clear to auscultation bilaterally, with no rhonchi, wheezes, or rales. Right breast: No palpable mass nipple discharge or bleeding Left breast: Ecchymosis in the 9:00 position of the left breast. The patient also appears to have a palpable area of induration measuring 1.5 cm Abdomen: Soft, nontender, nondistended, with no rigidity or guarding. Extremities: No cyanosis or  edema. Lymphatics: see Neck Exam Skin: No concerning lesions. Musculoskeletal: symmetric strength and muscle tone throughout. Neurologic: Cranial nerves II through XII are grossly intact. No obvious focalities. Speech is fluent. Coordination is intact. Psychiatric: Judgment and insight are intact. Affect is appropriate.     ECOG = 1  0 - Asymptomatic (Fully active, able to carry on all predisease activities without restriction)  1 - Symptomatic but completely ambulatory (Restricted in physically strenuous activity but ambulatory and able to carry out work of a light or sedentary nature. For example, light housework, office work)  2 - Symptomatic, <50% in bed during the day (Ambulatory and capable of all self care but unable to carry out any work activities. Up and about more than 50% of waking hours)  3 - Symptomatic, >50% in bed, but not bedbound (Capable of only limited self-care, confined to bed or chair 50% or more of waking hours)  4 - Bedbound (Completely disabled. Cannot carry on any self-care. Totally confined to bed or chair)  5 - Death   Eustace Pen MM, Creech RH, Tormey DC, et al. [phone]). "Toxicity and response criteria of the [facility]". [place] Oncol. 5 (6): 649-55  LABORATORY DATA:  Lab Results  Component Value Date   WBC 6.0 03/31/2013   HGB 13.5 03/31/2013   HCT 39.1 03/31/2013   MCV 90.3 03/31/2013   PLT 278 03/31/2013   NEUTROABS 2.6 03/31/2013   Lab Results  Component Value Date   NA 138 11/30/2013   K 3.7 11/30/2013   CL 103 11/30/2013   CO2 25 11/30/2013   GLUCOSE 145 (H) 11/30/2013   CREATININE 1.22 (H) 11/30/2013   CALCIUM 9.6 11/30/2013      RADIOGRAPHY: US Breast Ltd Uni Left Inc Axilla  Result Date: 07/03/2017 CLINICAL DATA:  Follow-up right breast mass EXAM: 2D DIGITAL DIAGNOSTIC BILATERAL MAMMOGRAM WITH CAD AND ADJUNCT TOMO ULTRASOUND LEFT BREAST COMPARISON:  Previous exam(s). ACR Breast Density Category b: There are  scattered areas of fibroglandular density. FINDINGS: The mass in the lateral right breast, followed in 2012, is stable mammographically. Multiple masses on the right are stable. There is a new focal asymmetry in the medial inferior left breast containing the amorphic calcifications. This focal asymmetry is irregular in shape and measures up to 1.8 cm. No other suspicious findings. Mammographic images were processed with CAD. On physical exam, no suspicious lumps are identified. Targeted ultrasound is performed, showing a mass in the left breast at 9 o'clock, 4 cm from the nipple measuring 1.4 x 1.9 x 1.2 cm. This mass contains calcifications in correlates with the mammographic finding. No adenopathy. IMPRESSION: New mass in the left breast at 9 o'clock,  4 cm from the nipple containing pleomorphism calcifications. RECOMMENDATION: Recommend ultrasound-guided biopsy of the new left breast mass. I have discussed the findings and recommendations with the patient. Results were also provided in writing at the conclusion of the visit. If applicable, a reminder letter will be sent to the patient regarding the next appointment. BI-RADS CATEGORY  4: Suspicious. Electronically Signed   By: Dorise Bullion III M.D   On: 07/03/2017 12:45   Mm Diag Breast Tomo Bilateral  Result Date: 07/03/2017 CLINICAL DATA:  Follow-up right breast mass EXAM: 2D DIGITAL DIAGNOSTIC BILATERAL MAMMOGRAM WITH CAD AND ADJUNCT TOMO ULTRASOUND LEFT BREAST COMPARISON:  Previous exam(s). ACR Breast Density Category b: There are scattered areas of fibroglandular density. FINDINGS: The mass in the lateral right breast, followed in 2012, is stable mammographically. Multiple masses on the right are stable. There is a new focal asymmetry in the medial inferior left breast containing the amorphic calcifications. This focal asymmetry is irregular in shape and measures up to 1.8 cm. No other suspicious findings. Mammographic images were processed with CAD. On  physical exam, no suspicious lumps are identified. Targeted ultrasound is performed, showing a mass in the left breast at 9 o'clock, 4 cm from the nipple measuring 1.4 x 1.9 x 1.2 cm. This mass contains calcifications in correlates with the mammographic finding. No adenopathy. IMPRESSION: New mass in the left breast at 9 o'clock, 4 cm from the nipple containing pleomorphism calcifications. RECOMMENDATION: Recommend ultrasound-guided biopsy of the new left breast mass. I have discussed the findings and recommendations with the patient. Results were also provided in writing at the conclusion of the visit. If applicable, a reminder letter will be sent to the patient regarding the next appointment. BI-RADS CATEGORY  4: Suspicious. Electronically Signed   By: Dorise Bullion III M.D   On: 07/03/2017 12:45   Mm Clip Placement Left  Result Date: 07/05/2017 CLINICAL DATA:  Evaluate biopsy marker EXAM: DIAGNOSTIC LEFT MAMMOGRAM POST ULTRASOUND BIOPSY COMPARISON:  Previous exam(s). FINDINGS: Mammographic images were obtained following ultrasound guided biopsy of a left breast mass with calcifications. The ribbon shaped clip is along the lateral margin of the biopsied mass/calcifications. IMPRESSION: Clip placement as above. Final Assessment: Post Procedure Mammograms for Marker Placement Electronically Signed   By: Dorise Bullion III M.D   On: 07/05/2017 13:36   [place] Lt Breast Bx W Loc Dev 1st Lesion Img Bx Spec US Guide  Addendum Date: 07/09/2017   ADDENDUM REPORT: 07/08/2017 14:22 ADDENDUM: Pathology revealed HIGH GRADE DUCTAL CARCINOMA IN SITU WITH FOCI SUSPICIOUS FOR MICROINVASION, NECROSIS AND CALCIFICATIONS of the Left breast, 9:00 o'clock. This was found to be concordant by Dr. Jimmye Norman. Pathology results were discussed with the patient by telephone. The patient reported doing well after the biopsy with tenderness at the site. Post biopsy instructions and care were reviewed and questions were answered. The patient  was encouraged to call [facility] for any additional concerns. The patient was referred to [facility] on July 17, 2017. Pathology results reported by Terie Purser, RN on 07/08/2017. Electronically Signed   By: Dorise Bullion III M.D   On: 07/08/2017 14:22   Result Date: 07/09/2017 CLINICAL DATA:  Biopsy of left breast mass/calcifications EXAM: ULTRASOUND GUIDED LEFT BREAST CORE NEEDLE BIOPSY COMPARISON:  Previous exam(s). FINDINGS: I met with the patient and we discussed the procedure of ultrasound-guided biopsy, including benefits and alternatives. We discussed the high likelihood  of a successful procedure. We discussed the risks of the procedure, including infection, bleeding, tissue injury, clip migration, and inadequate sampling. Informed written consent was given. The usual time-out protocol was performed immediately prior to the procedure. Lesion quadrant: Lower inner Using sterile technique and 1% Lidocaine as local anesthetic, under direct ultrasound visualization, a 12 gauge spring-loaded device was used to perform biopsy of the medial left breast mass/calcifications using a medial approach. At the conclusion of the procedure a ribbon shaped tissue marker clip was deployed into the biopsy cavity. Follow up 2 view mammogram was performed and dictated separately. IMPRESSION: Ultrasound guided biopsy of the left breast mass/calcifications. No apparent complications. Electronically Signed: By: Dorise Bullion III M.D On: 07/05/2017 13:37      IMPRESSION: Stage 0 (cTis (DCIS), cN0, cM0, G3, ER: Negative, PR: Negative, HER2: Unknown) presenting in the left breast. Patient's pathology is suspicious for microinvasion and therefore she will proceed with sentinel node procedure the same time as her lumpectomy. Patient would appear to be a good candidate for breast conserving therapy with radiation therapy  directed at the left breast. I discussed the overall course of treatment side effects and potential long-term toxicities of radiation therapy in this situation with the patient. She appears to understand and wishes to proceed with planned course of treatment.   PLAN: Patient will be scheduled for surgery under the direction of Dr. Marlou Starks. The patient will be seen in the postoperative setting for further evaluation as it relates to radiation therapy as breast conserving treatment.    ------------------------------------------------  Blair Promise, PhD, MD

## 2017-07-17 ENCOUNTER — Ambulatory Visit: Payer: Self-pay | Admitting: General Surgery

## 2017-07-17 ENCOUNTER — Inpatient Hospital Stay: Payer: Medicare Other

## 2017-07-17 ENCOUNTER — Other Ambulatory Visit: Payer: Self-pay

## 2017-07-17 ENCOUNTER — Encounter: Payer: Self-pay | Admitting: Hematology

## 2017-07-17 ENCOUNTER — Ambulatory Visit: Payer: Medicare Other | Attending: General Surgery | Admitting: Physical Therapy

## 2017-07-17 ENCOUNTER — Encounter: Payer: Self-pay | Admitting: Physical Therapy

## 2017-07-17 ENCOUNTER — Inpatient Hospital Stay: Payer: Medicare Other | Attending: Hematology | Admitting: Hematology

## 2017-07-17 ENCOUNTER — Ambulatory Visit
Admission: RE | Admit: 2017-07-17 | Discharge: 2017-07-17 | Disposition: A | Discharge status: Home / Self Care | Payer: Medicare Other | Source: Ambulatory Visit | Attending: Radiation Oncology | Admitting: Radiation Oncology

## 2017-07-17 VITALS — BP 108/74 | HR 58 | Temp 98.2°F | Resp 18

## 2017-07-17 DIAGNOSIS — Z951 Presence of aortocoronary bypass graft: Secondary | ICD-10-CM | POA: Diagnosis not present

## 2017-07-17 DIAGNOSIS — K219 Gastro-esophageal reflux disease without esophagitis: Secondary | ICD-10-CM | POA: Insufficient documentation

## 2017-07-17 DIAGNOSIS — I2510 Atherosclerotic heart disease of native coronary artery without angina pectoris: Secondary | ICD-10-CM | POA: Insufficient documentation

## 2017-07-17 DIAGNOSIS — D0512 Intraductal carcinoma in situ of left breast: Secondary | ICD-10-CM

## 2017-07-17 DIAGNOSIS — E785 Hyperlipidemia, unspecified: Secondary | ICD-10-CM | POA: Insufficient documentation

## 2017-07-17 DIAGNOSIS — Z87891 Personal history of nicotine dependence: Secondary | ICD-10-CM | POA: Diagnosis not present

## 2017-07-17 DIAGNOSIS — I252 Old myocardial infarction: Secondary | ICD-10-CM | POA: Diagnosis not present

## 2017-07-17 DIAGNOSIS — Z79899 Other long term (current) drug therapy: Secondary | ICD-10-CM | POA: Insufficient documentation

## 2017-07-17 DIAGNOSIS — E1122 Type 2 diabetes mellitus with diabetic chronic kidney disease: Secondary | ICD-10-CM | POA: Insufficient documentation

## 2017-07-17 DIAGNOSIS — R293 Abnormal posture: Secondary | ICD-10-CM | POA: Diagnosis present

## 2017-07-17 DIAGNOSIS — Z7984 Long term (current) use of oral hypoglycemic drugs: Secondary | ICD-10-CM | POA: Diagnosis not present

## 2017-07-17 DIAGNOSIS — M25611 Stiffness of right shoulder, not elsewhere classified: Secondary | ICD-10-CM | POA: Diagnosis present

## 2017-07-17 DIAGNOSIS — N183 Chronic kidney disease, stage 3 (moderate): Secondary | ICD-10-CM | POA: Diagnosis not present

## 2017-07-17 DIAGNOSIS — I4891 Unspecified atrial fibrillation: Secondary | ICD-10-CM | POA: Insufficient documentation

## 2017-07-17 DIAGNOSIS — M25612 Stiffness of left shoulder, not elsewhere classified: Secondary | ICD-10-CM | POA: Insufficient documentation

## 2017-07-17 DIAGNOSIS — I129 Hypertensive chronic kidney disease with stage 1 through stage 4 chronic kidney disease, or unspecified chronic kidney disease: Secondary | ICD-10-CM | POA: Insufficient documentation

## 2017-07-17 LAB — CBC WITH DIFFERENTIAL (CANCER CENTER ONLY)
BASOS ABS: 0 10*3/uL (ref 0.0–0.1)
BASOS PCT: 1 %
Eosinophils Absolute: 0.3 10*3/uL (ref 0.0–0.5)
Eosinophils Relative: 4 %
HEMATOCRIT: 32.5 % — AB (ref 34.8–46.6)
HEMOGLOBIN: 10.4 g/dL — AB (ref 11.6–15.9)
LYMPHS PCT: 32 %
Lymphs Abs: 1.9 10*3/uL (ref 0.9–3.3)
MCH: 29.6 pg (ref 25.1–34.0)
MCHC: 32 g/dL (ref 31.5–36.0)
MCV: 92.6 fL (ref 79.5–101.0)
MONO ABS: 0.5 10*3/uL (ref 0.1–0.9)
Monocytes Relative: 9 %
NEUTROS ABS: 3.2 10*3/uL (ref 1.5–6.5)
NEUTROS PCT: 54 %
Platelet Count: 245 10*3/uL (ref 145–400)
RBC: 3.51 MIL/uL — ABNORMAL LOW (ref 3.70–5.45)
RDW: 16.2 % — ABNORMAL HIGH (ref 11.2–16.1)
WBC Count: 5.8 10*3/uL (ref 3.9–10.3)

## 2017-07-17 LAB — CMP (CANCER CENTER ONLY)
ALBUMIN: 3.8 g/dL (ref 3.5–5.0)
ALK PHOS: 93 U/L (ref 40–150)
ALT: 15 U/L (ref 0–55)
ANION GAP: 9 (ref 3–11)
AST: 18 U/L (ref 5–34)
BILIRUBIN TOTAL: 0.5 mg/dL (ref 0.2–1.2)
BUN: 23 mg/dL (ref 7–26)
CALCIUM: 10 mg/dL (ref 8.4–10.4)
CO2: 30 mmol/L — AB (ref 22–29)
Chloride: 103 mmol/L (ref 98–109)
Creatinine: 2.08 mg/dL — ABNORMAL HIGH (ref 0.60–1.10)
GFR, EST AFRICAN AMERICAN: 27 mL/min — AB (ref >60)
GFR, Estimated: 24 mL/min — ABNORMAL LOW (ref >60)
GLUCOSE: 109 mg/dL (ref 70–140)
POTASSIUM: 4 mmol/L (ref 3.3–4.7)
SODIUM: 142 mmol/L (ref 136–145)
TOTAL PROTEIN: 7.4 g/dL (ref 6.4–8.3)

## 2017-07-17 NOTE — Therapy (Signed)
[place], [place], [address] Phone: [phone]   Fax:  [phone]  Physical Therapy Evaluation  Patient Details  Name: [name] MRN: [id number] Date of Birth: [birth year] Referring Provider: Dr. Autumn Messing   Encounter Date: 07/17/2017  PT End of Session - 07/17/17 1511    Visit Number  1    Number of Visits  2    Date for PT Re-Evaluation  09/11/17    PT Start Time  3244    PT Stop Time  1449 Also saw pt from 1400-1406 for a total of 21 minutes    PT Time Calculation (min)  15 min    Activity Tolerance  Patient tolerated treatment well    Behavior During Therapy  [facility] for tasks assessed/performed       Past Medical History:  Diagnosis Date  . Acute kidney failure ([place])   . ASHD (arteriosclerotic heart disease)    native vessel  . Atrial fibrillation ([place])   . CAD (coronary artery disease) 2004   multivessel, status post CABG x3  . Carotid artery disease ([place])    stent in 2013  . CKD (chronic kidney disease), stage III ([place])   . Diabetes ([place])   . Diabetes ([place])   . GERD (gastroesophageal reflux disease)   . History of tobacco abuse   . Hx of adenomatous polyp of colon 07/26/2016  . Hyperlipidemia   . Hypertension   . Hyperthyroidism   . Myocardial infarction ([place]) 2002  . Peripheral vascular disease ([facility])    S/P popliteal artery and renal artery stents  . Renovascular hypertension   . S/P CABG x 3 2004  . Thyroid goiter   . Tobacco abuse   . 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 CARDIAC CATHETERIZATION  09/23/2002   3 vessel CAD >> CABG (Dr. Marella Chimes)  . CARDIAC CATHETERIZATION  12/23/2003   patent LIMA, LAD, SVG to OM1, SVG to RCA, distal disease beyond graft insertion (Dr. Marella Chimes)  . CARDIOVERSION  09/11/2011   Procedure: CARDIOVERSION;  Surgeon: Sanda Klein, MD;  Location: [place];  Service: Cardiovascular;   Laterality: N/A;  . CAROTID ANGIOGRAM Bilateral 02/12/2012   Procedure: CAROTID ANGIOGRAM;  Surgeon: Serafina Mitchell, MD;  Location: [facility] CATH LAB;  Service: Cardiovascular;  Laterality: Bilateral;  . CAROTID DOPPLER  03/2013   right bulb & prox ICA w/50-69% stenosis, L ICA stent open/patent  . CAROTID STENT  02/12/2012   Carotid Stent Insertion, Carotid Angiogram - 9x40 Cordis Precise stent  (Dr. Pierre Bali)  . CAROTID STENT INSERTION N/A 02/12/2012   Procedure: CAROTID STENT INSERTION;  Surgeon: Serafina Mitchell, MD;  Location: [facility] CATH LAB;  Service: Cardiovascular;  Laterality: N/A;  . CORONARY ARTERY BYPASS GRAFT  09/24/2002   LIMA to LAD, SVG to Cfx, SVG to RCA (Dr. Prescott Gum)  . LOWER EXTREMITY ARTERIAL DOPPLER  09/2012   bilateral ABIS (mod arterial insuff at rest); R SFA 70-99% stenosis, L SFA 50-69% stenosis  . NM MYOCAR PERF WALL MOTION  12/2010   persnatine - moderate perfusion defect in apical anterior/apical/basal inferolateral/mid inferolateral/apical lateral walls - EF 65% - low risk  . RENAL ARTERY ANGIOPLASTY  02/11/2003   transluminal angioplasty & stent wtih 4x70m Genesis balloon expandable stent premounted on Cordis aviatory balloon (Dr. RCharlyne Petrin  . RENAL DOPPLER  09/2012   R prox renal artery @ stent equal/less that 60%  diameter reduction; L renal artery 1-59% stenosis  . TRANSTHORACIC ECHOCARDIOGRAM  07/2012   EF 55-65%, mild conc LVH, grade 1 diastolic dysfunction; calcicifed MV annulus, mild MR; LA mild-mod dilated; RA mod dilated; mod TR; PA peak pressure 50mHg, RVSP increased (pulm htn)    There were no vitals filed for this visit.   Subjective Assessment - 07/17/17 1451    Subjective  Patient reports she is here today to be seen by her medical team for her newly diagnosed left breast cancer.    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 It measures 1.9 cm and is  located in the lower inner quadrant. It is ER/PR negative. She had a CABG in 2002, has diabetes, hypertension, and thyroid dysfunction.    Patient Stated Goals  Reduce lymphedema risk and learn post op shoulder ROM HEP    Currently in Pain?  No/denies         [facility] - 07/17/17 0001      Assessment   Medical Diagnosis  Left breast cancer    Referring Provider  Dr. PAutumn Messing   Onset Date/Surgical Date  07/03/17    Hand Dominance  Right    Prior Therapy  none      Precautions   Precautions  Other (comment)    Precaution Comments  active cancer; cardiac hx      Restrictions   Weight Bearing Restrictions  No      Balance Screen   Has the patient fallen in the past 6 months  No    Has the patient had a decrease in activity level because of a fear of falling?   No    Is the patient reluctant to leave their home because of a fear of falling?   No      Home [place]video [place]idence    Living Arrangements  Spouse/significant other    Available Help at Discharge  Family      Prior Function   [place]    Leisure  She does chair exercises once a week      Cognition   Overall Cognitive Status  Within Functional Limits for tasks assessed      Posture/Postural Control   Posture/Postural Control  Postural limitations    Postural Limitations  Rounded Shoulders;Forward head      ROM / Strength   AROM / PROM / Strength  AROM;Strength      AROM   AROM Assessment Site  Shoulder;Cervical    Right/Left Shoulder  Right;Left    Right Shoulder Extension  55 Degrees    Right Shoulder Flexion  128 Degrees    Right Shoulder ABduction  148 Degrees    Right Shoulder Internal Rotation  40 Degrees    Right Shoulder External Rotation  62 Degrees    Left Shoulder Extension  52 Degrees    Left Shoulder Flexion  130 Degrees    Left Shoulder ABduction  136 Degrees    Left Shoulder Internal Rotation  58 Degrees     Left Shoulder External Rotation  80 Degrees    Cervical Flexion  WNL    Cervical Extension  WNL    Cervical - Right Side Bend  WNL    Cervical - Left Side Bend  WNL  Cervical - Right Rotation  25% limited    Cervical - Left Rotation  25% limited      Strength   Overall Strength  Within functional limits for tasks performed        LYMPHEDEMA/ONCOLOGY QUESTIONNAIRE - 07/17/17 1505      Type   Cancer Type  Left breast cancer DCIS      Lymphedema Assessments   Lymphedema Assessments  Upper extremities      Right Upper Extremity Lymphedema   10 cm Proximal to Olecranon Process  27.5 cm    Olecranon Process  23.5 cm    10 cm Proximal to Ulnar Styloid Process  20 cm    Just Proximal to Ulnar Styloid Process  15.4 cm    Across Hand at [employer]  18.5 cm    At [place] of 2nd Digit  6.1 cm      Left Upper Extremity Lymphedema   10 cm Proximal to Olecranon Process  27.6 cm    Olecranon Process  23.7 cm    10 cm Proximal to Ulnar Styloid Process  19 cm    Just Proximal to Ulnar Styloid Process  15.5 cm    Across Hand at [employer]  18.4 cm    At [place] of 2nd Digit  6 cm          Objective measurements completed on examination: See above findings.    Patient was instructed today in a home exercise program today for post op shoulder range of motion. These included active assist shoulder flexion in sitting, scapular retraction, wall walking with shoulder abduction, and hands behind head external rotation.  She was encouraged to do these twice a day, holding 3 seconds and repeating 5 times when permitted by her physician.     PT Education - 07/17/17 1510    Education provided  Yes    Education Details  Lymphedema risk reduction and post op shoulder ROM HEP    Person(s) Educated  Patient;Other (comment) sister    Methods  Explanation;Demonstration;Handout    Comprehension  Returned demonstration;Verbalized understanding          PT Long Term Goals - 07/17/17  1515      PT LONG TERM GOAL #1   Title  Patient will demonstrate she has returned to baseline with her left shoulder ROM and HEP    Time  8    Period  Weeks    Status  New      Breast Clinic Goals - 07/17/17 1515      Patient will be able to verbalize understanding of pertinent lymphedema risk reduction practices relevant to her diagnosis specifically related to skin care.   Time  1    Period  Days    Status  Achieved      Patient will be able to return demonstrate and/or verbalize understanding of the post-op home exercise program related to regaining shoulder range of motion.   Time  1    Period  Days    Status  Achieved      Patient will be able to verbalize understanding of the importance of attending the postoperative After Breast Cancer Class for further lymphedema risk reduction education and therapeutic exercise.   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 It measures 1.9 cm and is located in the lower inner quadrant. It is ER/PR negative. She had a CABG in 2002, has diabetes, hypertension, and thyroid dysfunction. Her multidisciplinary medical team met prior to hre assessments to determine a recommended treatment plan. She is planning to have a left lumpectomy and sentinel node biopsy followed by radiation. She will benefit from a post op PT visit to determine needs.    History and Personal Factors relevant to plan of care:  CABG 2002    Clinical Presentation  Stable    Clinical Decision Making  Low    Rehab Potential  Excellent    Clinical Impairments Affecting Rehab Potential  None    PT Frequency  -- eval and 1 f/u visit    PT Treatment/Interventions  ADLs/Self Care Home Management;Therapeutic exercise;Patient/family education    PT Next Visit Plan  Will reassess 3-4 weeks post op    PT Home Exercise Plan  Post  op shoulder ROM HEP    Consulted and Agree with Plan of Care  Patient;Family member/caregiver    Family Member Consulted  Sister       Patient will benefit from skilled therapeutic intervention in order to improve the following deficits and impairments:  Pain, Decreased range of motion, Decreased knowledge of precautions, Impaired UE functional use, Postural dysfunction  Visit Diagnosis: Ductal carcinoma in situ (DCIS) of left breast - Plan: PT plan of care cert/re-cert  Abnormal posture - Plan: PT plan of care cert/re-cert  Stiffness of left shoulder, not elsewhere classified - Plan: PT plan of care cert/re-cert  Stiffness of right shoulder, not elsewhere classified - Plan: PT plan of care cert/re-cert   Patient will follow up at outpatient cancer rehab 3-4 weeks following surgery.  If the patient requires physical therapy at that time, a specific plan will be dictated and sent to the referring physician for approval. The patient was educated today on appropriate basic range of motion exercises to begin post operatively and the importance of attending the After Breast Cancer class following surgery.  Patient was educated today on lymphedema risk reduction practices as it pertains to recommendations that will benefit the patient immediately following surgery.  She verbalized good understanding.      Problem List Patient Active Problem List   Diagnosis Date Noted  . Ductal carcinoma in situ (DCIS) of left breast 07/10/2017  . Hx of adenomatous polyp of colon 07/26/2016  . Abnormal nuclear stress test 12/18/2013  . Right renal artery stenosis s/p stent 11/23/2013  . Bradycardia 02/13/2012  . PAF (paroxysmal atrial fibrillation) ([place]) 02/13/2012  . PVD (peripheral vascular disease) ([place]) 02/13/2012  . CAD, CABG X 3 2004, distal LAD/OM disease 2005, Myoview low risk 7/12 02/13/2012  . Diabetes mellitus, Type 2 NIDDM 02/13/2012  . HTN (hypertension) 02/13/2012  . Dyslipidemia 02/13/2012   . Chronic anticoagulation 02/13/2012  . Carotid stenosis 01/28/2012    Annia Friendly, PT 07/17/17 3:18 PM  [place], [place], [address] Phone: [phone]   Fax:  [phone]  Name: [name] MRN: [id number] Date of Birth: 1950-11-08

## 2017-07-17 NOTE — Patient Instructions (Signed)

## 2017-07-17 NOTE — Progress Notes (Signed)
Nutrition Assessment  Reason for Assessment:  Pt seen in Breast Clinic  ASSESSMENT:   67 year old female with new diagnosis of breast cancer.  Past medical history of DM, dyslipidemia, CAD, HTN.  Patient reports normal appetite  Medications:  reviewed  Labs: reviewed  Anthropometrics:   Height: 65 inches Weight: 180 lb BMI: 30   NUTRITION DIAGNOSIS: Food and nutrition related knowledge deficit related to new diagnosis of breast cancer as evidenced by no prior need for nutrition related information.  INTERVENTION:   Discussed and provided packet of information regarding nutritional tips for breast cancer patients.  Questions answered.  Teachback method used.  Contact information provided and patient knows to contact me with questions/concerns.    MONITORING, EVALUATION, and GOAL: Pt will consume a healthy plant based diet to maintain lean body mass throughout treatment.   [name], [place], [place] Registered Dietitian [phone] (pager)

## 2017-07-18 ENCOUNTER — Encounter: Payer: Self-pay | Admitting: General Practice

## 2017-07-18 ENCOUNTER — Telehealth: Payer: Self-pay | Admitting: Hematology

## 2017-07-18 ENCOUNTER — Telehealth: Payer: Self-pay | Admitting: *Deleted

## 2017-07-18 NOTE — Telephone Encounter (Signed)
   [facility] Pre-operative Risk Assessment    Request for surgical clearance:  1. What type of surgery is being performed? BREAST LUMPECTOMY    2. When is this surgery scheduled? TBD   3. Are there any medications that need to be held prior to surgery and how long?ELIQUIS    4. Practice name and name of physician performing surgery? [facility] Eddie Dibbles [facility]   5. What is your office phone and fax number?PHONE [place]    6. Anesthesia type (None, local, MAC, general) ? GENERAL ANESTHESIA

## 2017-07-18 NOTE — Telephone Encounter (Signed)
No 1/16 los.  

## 2017-07-18 NOTE — Progress Notes (Signed)
[name] presented to Breast Multidisciplinary Clinic, where she received full packet of [place] team/resource into and completed distress screen per protocol.  The patient scored a 0 on the Psychosocial Distress Thermometer which indicates minimal distress.   ONCBCN DISTRESS SCREENING 07/18/2017  Screening Type Initial Screening  Distress experienced in past week (1-10) 0  Referral to support programs Yes    Follow up needed: No. Pt screens out at this time, but please page if immediate needs arise or circumstances change. Thank you.   [place], [place], [facility] Pager [phone] Voicemail [phone]

## 2017-07-19 NOTE — Telephone Encounter (Signed)
I called the pt and advised per Ellen Henri, PA pt will need an appt before she can be cleared for her surgery. Pt has been scheduled to see Rosaria Ferries, PA 07/29/17 @ 11:30 for pre-op clearance. See Pre-Op call back pool for information for surgery. Pt is agreeable to plan of care.

## 2017-07-19 NOTE — Telephone Encounter (Signed)
   Primary Cardiologist:No primary care provider on file.  Chart reviewed as part of pre-operative protocol coverage. Because of [name]'s past medical history and time since last visit, he/she will require a follow-up visit in order to better assess preoperative cardiovascular risk.  Pre-op covering staff: - Please schedule appointment and call patient to inform them. - Please contact requesting surgeon's office via preferred method (i.e, phone, fax) to inform them of need for appointment prior to surgery.  Lyda Jester, PA-C  07/19/2017, 2:35 PM

## 2017-07-24 ENCOUNTER — Ambulatory Visit (HOSPITAL_COMMUNITY)
Admission: RE | Admit: 2017-07-24 | Discharge: 2017-07-24 | Disposition: A | Discharge status: Home / Self Care | Payer: Medicare Other | Source: Ambulatory Visit | Attending: Cardiology | Admitting: Cardiology

## 2017-07-24 ENCOUNTER — Ambulatory Visit: Payer: Medicare Other | Admitting: Cardiology

## 2017-07-24 DIAGNOSIS — I6523 Occlusion and stenosis of bilateral carotid arteries: Secondary | ICD-10-CM | POA: Diagnosis not present

## 2017-07-25 ENCOUNTER — Telehealth: Payer: Self-pay | Admitting: *Deleted

## 2017-07-25 NOTE — Telephone Encounter (Signed)
Spoke to pt concerning [place] from 07/17/17. Denies questions or needs regarding dx or treatment care plan. Encourage pt to call with needs. Received verbal understanding. Contact information provided.

## 2017-07-29 ENCOUNTER — Encounter: Payer: Self-pay | Admitting: Physician Assistant

## 2017-07-29 ENCOUNTER — Ambulatory Visit: Payer: Medicare Other | Admitting: Physician Assistant

## 2017-07-29 VITALS — BP 106/54 | HR 70 | Ht 65.0 in | Wt 161.0 lb

## 2017-07-29 DIAGNOSIS — I10 Essential (primary) hypertension: Secondary | ICD-10-CM | POA: Diagnosis not present

## 2017-07-29 DIAGNOSIS — I481 Persistent atrial fibrillation: Secondary | ICD-10-CM

## 2017-07-29 DIAGNOSIS — E785 Hyperlipidemia, unspecified: Secondary | ICD-10-CM

## 2017-07-29 DIAGNOSIS — Z01818 Encounter for other preprocedural examination: Secondary | ICD-10-CM

## 2017-07-29 DIAGNOSIS — I4819 Other persistent atrial fibrillation: Secondary | ICD-10-CM

## 2017-07-29 NOTE — Telephone Encounter (Signed)
   Primary Cardiologist: Sanda Klein, MD  Chart reviewed as part of pre-operative protocol coverage  Pt seen in the office today, evaluated and data reviewed. Based on ACC/AHA guidelines, [name] would be at increased but acceptable risk for the planned procedure without further cardiovascular testing. Pt is aware and wants to have the surgery.   I will route this recommendation to the requesting party via Epic fax function and remove from pre-op pool.  Please call with questions.  Rosaria Ferries, PA-C 07/29/2017, 5:00 PM

## 2017-07-29 NOTE — Progress Notes (Signed)
Would not change her BP and antianginal meds at this time. Would tweak them after her surgery. Make sure her beta blocker is not interrupted periop.

## 2017-07-29 NOTE — Progress Notes (Signed)
Agree she does not need additional cardiac workup before the breast surgery. Due to impaired renal function, I would DC the Eliquis 3 days before surgery, rather than just 2 days. Thanks [employer]

## 2017-07-29 NOTE — Patient Instructions (Addendum)
Medication Instructions:  Your physician recommends that you continue on your current medications as directed. Please refer to the Current Medication list given to you today.  Labwork: None    Testing/Procedures: None   Follow-Up: Keep your upcoming appointment with Dr Sallyanne Kuster  Increase activity as tolerated  Any Other Special Instructions Will Be Listed Below (If Applicable). If you need a refill on your cardiac medications before your next appointment, please call your pharmacy.

## 2017-07-29 NOTE — Progress Notes (Signed)
Cardiology Office Note   Date:  07/29/2017   ID:  [name], DOB [birth year], MRN [id number]  PCP:  Prince Solian, MD  Cardiologist: Dr. Sallyanne Kuster, 08/17/2016 [name], [place]   Chief Complaint  Patient presents with  . Preoperative Clearance    pt states no Sx.    History of Present Illness: [name] is a 67 y.o. female with a history of persistent A. Fib, CABG 2004 (LIMA-LAD, SVG-OM1, SVG-RCA); grafts patent cath 2005 w/ distal LAD/OM dz, carotid stent 2013, renal stent 2004, popliteal stent >6 yr ago, DM, HTN, HLD, GERD, remoteTob use  07/18/2017 phone notes regarding recent dx breast CA>>need for breast lumpectomy>>appt made  [name] presents for cardiology follow up and evaluation.   She does not do strenuous activity. She can vacuum, no chest pain or SOB with this. She can carry groceries in from the car. When she does too much, she will get chest pressure. However, she has not done enough to have that in 3 years or so.  She can walk a block and a half to visit her neighbor, no CP. She will rarely get SOB with walking, will slow down or rest so she does not get SOB. No yard work.   She occasionally feels her heart out of rhythm, no presyncope, does not get light-headed.   She denies any orthopnea or PND.  No LE edema, rare LE cramps once a month or so, relieved by rest. Pt saw Dr Trula Slade remotely, not in >2 years.    Past Medical History:  Diagnosis Date  . Acute kidney failure ([place])   . ASHD (arteriosclerotic heart disease)    native vessel  . Atrial fibrillation ([place])   . CAD (coronary artery disease) 2004   multivessel, status post CABG x3  . Carotid artery disease ([place])    stent in 2013  . CKD (chronic kidney disease), stage III ([place])   . Diabetes ([place])   . GERD (gastroesophageal reflux disease)   . History of tobacco abuse   . Hx of adenomatous polyp of colon 07/26/2016  . Hyperlipidemia   . Hypertension   .  Hyperthyroidism   . Myocardial infarction ([place]) 2002  . Peripheral vascular disease ([facility])    S/P popliteal artery and renal artery stents  . Renovascular hypertension   . S/P CABG x 3 2004  . Thyroid goiter   . Tobacco abuse   . 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 CARDIAC CATHETERIZATION  09/23/2002   3 vessel CAD >> CABG (Dr. Marella Chimes)  . CARDIAC CATHETERIZATION  12/23/2003   patent LIMA, LAD, SVG to OM1, SVG to RCA, distal disease beyond graft insertion (Dr. Marella Chimes)  . CARDIOVERSION  09/11/2011   Procedure: CARDIOVERSION;  Surgeon: Sanda Klein, MD;  Location: [facility];  Service: Cardiovascular;  Laterality: N/A;  . CAROTID ANGIOGRAM Bilateral 02/12/2012   Procedure: CAROTID ANGIOGRAM;  Surgeon: Serafina Mitchell, MD;  Location: [facility] CATH LAB;  Service: Cardiovascular;  Laterality: Bilateral;  . CAROTID DOPPLER  03/2013   right bulb & prox ICA w/50-69% stenosis, L ICA stent open/patent  . CAROTID STENT  02/12/2012   Carotid Stent Insertion, Carotid Angiogram - 9x40 Cordis Precise stent  (Dr. Pierre Bali)  . CAROTID STENT INSERTION N/A 02/12/2012   Procedure: CAROTID STENT INSERTION;  Surgeon: Serafina Mitchell, MD;  Location: [facility] CATH LAB;  Service: Cardiovascular;  Laterality: N/A;  .  CORONARY ARTERY BYPASS GRAFT  09/24/2002   LIMA to LAD, SVG to Cfx, SVG to RCA (Dr. Prescott Gum)  . LOWER EXTREMITY ARTERIAL DOPPLER  09/2012   bilateral ABIS (mod arterial insuff at rest); R SFA 70-99% stenosis, L SFA 50-69% stenosis  . NM MYOCAR PERF WALL MOTION  12/2010   persnatine - moderate perfusion defect in apical anterior/apical/basal inferolateral/mid inferolateral/apical lateral walls - EF 65% - low risk  . RENAL ARTERY ANGIOPLASTY  02/11/2003   transluminal angioplasty & stent wtih 4x74mm Genesis balloon expandable stent premounted on Cordis aviatory balloon (Dr. Charlyne Petrin)  . RENAL DOPPLER  09/2012   R prox renal artery @ stent equal/less that 60%  diameter reduction; L renal artery 1-59% stenosis  . TRANSTHORACIC ECHOCARDIOGRAM  07/2012   EF 55-65%, mild conc LVH, grade 1 diastolic dysfunction; calcicifed MV annulus, mild MR; LA mild-mod dilated; RA mod dilated; mod TR; PA peak pressure 44mmHg, RVSP increased (pulm htn)    Current Outpatient Medications  Medication Sig Dispense Refill  . amLODipine (NORVASC) 10 MG tablet TAKE 1 TABLET BY MOUTH  DAILY 90 tablet 0  . apixaban (ELIQUIS) 5 MG TABS tablet TAKE 1 TABLET(5 MG) BY MOUTH TWICE DAILY 60 tablet 1  . lansoprazole (PREVACID) 15 MG capsule Take 1 capsule (15 mg total) by mouth daily. 90 capsule 3  . metFORMIN (GLUCOPHAGE) 1000 MG tablet Take 1 tablet (1,000 mg total) by mouth 2 (two) times daily with a meal. 180 tablet 3  . methimazole (TAPAZOLE) 10 MG tablet Take 10 mg by mouth 2 (two) times daily.  5  . metoprolol tartrate (LOPRESSOR) 50 MG tablet TAKE 1 TABLET BY MOUTH  EVERY DAY 90 tablet 0  . nitroGLYCERIN (NITROSTAT) 0.4 MG SL tablet Place 1 tablet (0.4 mg total) under the tongue every 5 (five) minutes as needed for chest pain. 25 tablet 6  . rosuvastatin (CRESTOR) 40 MG tablet TAKE 1 TABLET BY MOUTH  DAILY 90 tablet 0  . triamterene-hydrochlorothiazide (MAXZIDE-25) 37.5-25 MG tablet TAKE 1 TABLET BY MOUTH  DAILY 90 tablet 0  . valsartan (DIOVAN) 160 MG tablet TAKE 1 TABLET BY MOUTH TWO  TIMES DAILY 180 tablet 0   Current Facility-Administered Medications  Medication Dose Route Frequency Provider Last Rate Last Dose  . 0.9 %  sodium chloride infusion  500 mL Intravenous Continuous Gatha Mayer, MD      . dextrose 5 % solution   Intravenous Continuous Gatha Mayer, MD        Allergies:   Patient has no known allergies.    Social History:  The patient  reports that she quit smoking about 16 years ago. She has a 30.00 pack-year smoking history. she has never used smokeless tobacco. She reports that she drinks about 1.2 - 1.8 oz of alcohol per week. She reports that she  does not use drugs.   Family History:  The patient's family history includes Deep vein thrombosis in her sister; Diabetes in her brother and sister; Heart disease in her sister; Hyperlipidemia in her sister and son; Hypertension in her mother, sister, and son; Other in her brother and mother.    ROS:  Please see the history of present illness. All other systems are reviewed and negative.    PHYSICAL EXAM: VS:  BP (!) 106/54 (BP Location: Left Arm, Patient Position: Sitting, Cuff Size: Normal)   Pulse 70   Ht 5\' 5"  (1.651 m)   Wt 161 lb (73 kg)   BMI 26.79  kg/m  , BMI Body mass index is 26.79 kg/m. GEN: Well nourished, well developed, female in no acute distress  HEENT: normal for age  Neck: no JVD, faint carotid bruits bilaterally (L>R) ?rad SEM, no masses Cardiac: irregularly irregular rhythm; 2/6 systolic murmur, no rubs or gallops Respiratory:  clear to auscultation bilaterally, normal work of breathing GI: soft, nontender, nondistended, + BS MS: no deformity or atrophy; no edema; upper extremity pulses are 2+, lower extremity pulses 1+  Skin: warm and dry, no rash Neuro:  Strength and sensation are intact Psych: euthymic mood, full affect   EKG:  EKG is ordered today. The ekg ordered today demonstrates atrial fibrillation with ventricular rate of 70, T wave inversion in II, III, aVF and V2-V6, no sig change from 08/2016  CATH: 12/23/2003 LV angiogram demonstrated hypokinesis of the mid anterior wall that was  mild.  Hypokinesis of the basilar and mid inferior and posterior apical  wall.  Estimated EF was approximately 50% with no significant MR.   Abdominal angiogram demonstrated single normal left renal artery.  Double  right renal artery.  The right superior renal artery was widely patent.  The  right inferior renal artery appeared stenosed at the origin, but this was  hidden because of projection.  The infrarenal abdominal aorta had very mild  atherosclerotic disease  and was relatively small.  The common and external  iliacs bilaterally were patent with no significant stenosis and hypogastrics  were intact.  The CFAs bilaterally were intact and the right profunda SFA  junction was intact.  There appeared to be fairly good runoff down to this  level.   Selective right renal angiogram revealed 90% in-stent ostial restenosis and  about 70% proximal restenosis of the previously placed right renal stent.   The main left coronary was small and short without significant stenosis.   The LAD was totally occluded in its proximal third after a large, but very  thin diagonal branch that had multiple collaterals forming.  This diagonal  was not jeopardized, but was very small.  The first septal perforator was  patent and large and also supplied collaterals to the circumflex and distal  right distribution.   The native circumflex artery showed 50% smooth narrowing just before the OM-  1 branch.  The OM-1 branch had competitive filling and was the grafted  marginal branch.  The circumflex proper had irregularities, but two  moderately large distal branches with no significant stenosis.  There were  collaterals from the distal native circumflex to the distal RCA.   The native right coronary had diffuse disease from beginning to end,  probably greater than 70-80% with focal 95 and 99% in the mid portion.  Very  small PDA and PLA branches.   The saphenous vein graft to the RCA was widely patent with excellent  anastomosis to a very small distal RCA.  There was 80% and diffuse narrowing  beyond the insertion, but there was good filling through the graft to these  very small grafted vessel and tributaries.   Saphenous vein graft to the OM-1 branch was widely patent with an excellent  anastomosis.  The superior bifurcation was widely patent.  The inferior  bifurcation had a 90% stenosis in a very small less than 1 mm vessel.   The LIMA was widely patent  through the mid LAD.  There was good flow, but  the LAD beyond the insertion was diffusely diseased with focal 80-85%  narrowing, but diffuse 70-80%  narrowing throughout its course, but good  filling to the apex despite the small vessel. The right renal artery was intubated with 6 [place] IMA catheter after the  patient had 3500 units of heparin IV and ACT was therapeutic.  The lesion  was crossed with 0.014 inch Asahi soft guide wire.  The in-stent restenosis  was dilated with a 4.0/10 coronary cutting balloon with inflations up to 10-  12 atmospheres.  The balloon was pulled back and angiography showed  excellent result with 85-90% ostial and proximal in-stent restenosis reduced  to less than 20% with excellent flow.  Dilatation system was removed, side  arm sheath was flushed and the patient was transferred to the holding area  for sheath removal and pressure hemostasis.  CATHETERIZATION DIAGNOSES:  1. Arteriosclerotic heart disease, severe diffuse diabetic type three-     vessel disease treated with coronary artery bypass graft x3 September 25, 2002 by Dr. Tharon Aquas Trigt.  2. Recurrent angina and positive Cardiolite.  3. Widely patent LIMA-LAD;     patent SVG-OM-1; patent SVG-dRCA     severe distal disease beyond graft     insertions in all three vessels as outlined above  4. Well-preserved left ventricular function, segmental wall motion     abnormality, ejection fraction 50%. 5. Adult onset diabetes mellitus.  6. Hyperlipidemia.  7. Past heavy smoker.  8. Renal vascular hypertension status post right renal artery percutaneous     transluminal angioplasty and stent August 2004, redilatation for ISR     March 2005; second redilatation for ISR and elevated blood pressure December 23, 2003.  9. Arteriosclerotic peripheral vascular disease, Fontaine 2b claudication     and high grade right popliteal stenosis treated with percutaneous     transluminal angioplasty successfully March  2005  LEXISCAN 12/15/2013:  QPS Raw Data Images:  Normal; no motion artifact; normal heart/lung ratio. Stress Images:  There is decreased uptake in the anterior wall. Rest Images:  Normal homogeneous uptake in all areas of the myocardium. Subtraction (SDS):  These findings are consistent with ischemia. Impression Exercise Capacity:  Lexiscan with no exercise. BP Response:  Normal blood pressure response. Clinical Symptoms:  Mild chest pain/dyspnea. ECG Impression:  Developed TWI inferior and AL leads Comparison with Prior Nuclear Study: New ischemia since prior study Dr Sallyanne Kuster reviewed cath films w/ Dr Ellyn Hack, no good revasc options, continue med rx  ECHO: 07/16/2012 - Left ventricle: The cavity size was normal. There was mild concentric hypertrophy. Systolic function was normal. The estimated ejection fraction was in the range of 55% to 65%. Wall motion was normal; there were no regional wall motion abnormalities. Doppler parameters are consistent with abnormal left ventricular relaxation (grade 1 diastolic dysfunction). - Mitral valve: Calcified annulus. Mild regurgitation. Valve area by pressure half-time: 1.91cm^2. - Left atrium: The atrium was mildly to moderately dilated. - Right atrium: The atrium was moderately dilated. - Atrial septum: No defect or patent foramen ovale was identified. - Tricuspid valve: Moderate regurgitation. - Pulmonary arteries: PA peak pressure: 30mm Hg (S). Impressions: - The right ventricular systolic pressure was increased consistent with moderate pulmonary hypertension.  Recent Labs: 07/17/2017: ALT 15; BUN 23; Platelet Count 245; Potassium 4.0; Sodium 142    Lipid Panel    Component Value Date/Time   CHOL 101 11/30/2013 1014   TRIG 70 11/30/2013 1014   HDL 43 11/30/2013 1014   CHOLHDL 2.3 11/30/2013 1014   VLDL 14 11/30/2013 1014  LDLCALC 44 11/30/2013 1014     Wt Readings from Last 3 Encounters:  07/29/17 161  lb (73 kg)  08/17/16 180 lb 12.8 oz (82 kg)  07/17/16 182 lb (82.6 kg)     ASSESSMENT AND PLAN:  1.  Preoperative Clearance - Patient was recently diagnosed with breast cancer and is here today for preop clearance for a lumpectomy.  - Patient denies chest pain or shortness of breath at rest. She is able to vacuum and carrying in groceries without any chest pain or shortness of breath. If she does too much, she does note some chest pressure. She tries not to exert herself to the point where she experiences symptoms. - Lexiscan in 12/15/2013 showed T wave inversion in inferior and lateral leads consistent with new ischemia from prior study in 12/2010. At following office visit on 02/15/2014, Dr. Sallyanne Kuster noted that she is best suited for medical therapy as there are no good revascularization options for the anterior wall. - Patient is at increased but risk for lumpectomy due to cardiac condition, but the risk is not prohibitive and no further workup is needed prior to the procedure. - OK to hold Eliquis as needed for the procedure.   2. Persistent Atrial Fibrillation - EKG today demonstrated atrial fibrillation with ventricular rate of 70, no acute ST changes - Patient is occasionally aware of palpitations. - Continue Eliquis 5mg  BID and Lopressor 50mg  daily.  3. HTN - BP 106/54 today. - Continue Valsartan 160mg  daily and amlodipine 10mg  daily for now  4. HLD  - Lipid panel: total cholesterol 101, triglycerides 70, HDL 43, LDL 44 - Continue Rosuvastatin  5. CKD IV - Cr 2.08 when recently checked, was 2.2 06/07/2017 - 03/2017 Cr 1.7 - pt metformin dose has not been decreased by Dr Dagmar Hait - Discuss w/ Croitoru if we should decrease or d/c the valsartan (no recent dose change)  6. CAD: She is on good therapy with metoprolol 50 mg bid and Crestor 40 mg qd. No ASA 2nd Eliquis. On valsartan 160 mg bid and amlodipine 10 mg qd. - not on Imdur but BP is borderline. Could decrease valsartan or  amlodipine to get Imdur on board.  - discuss med changes w/ MD. - no echo since 2014, no MV since 2015. However, since no good revasc options in 2015 and sx do not seem to have progressed significantly, do not see a benefit to additional testing at this time.   Current medicines are reviewed at length with the patient today.  The patient does not have concerns regarding medicines.  The following changes have been made:  no change  Labs/ tests ordered today include:   Orders Placed This Encounter  Procedures  . EKG 12-Lead     Disposition:   FU with Dr. Sallyanne Kuster  Signed, [name], Student-PA  07/29/2017 1:05 PM    [facility] Phone: [phone]; Fax: [phone]  This note was written with the assistance of speech recognition software. Please excuse any transcriptional errors.

## 2017-07-30 NOTE — Telephone Encounter (Signed)
   Dr. Sallyanne Kuster has reviewed all the data.     His preference is that Ms. Hancox holds her Eliquis for 3 days prior to the surgery, restart as soon after the surgery as Dr. Marlou Starks feels appropriate.  Rosaria Ferries, PA-C 07/30/2017 10:27 AM Beeper [phone]

## 2017-07-30 NOTE — Telephone Encounter (Signed)
    Dr. Sallyanne Kuster recommends continuing her beta-blocker in the perioperative period without interruption.  Rosaria Ferries, PA-C 07/30/2017 10:30 AM Beeper [phone]

## 2017-08-02 ENCOUNTER — Encounter (HOSPITAL_BASED_OUTPATIENT_CLINIC_OR_DEPARTMENT_OTHER): Payer: Self-pay | Admitting: *Deleted

## 2017-08-02 ENCOUNTER — Other Ambulatory Visit: Payer: Self-pay | Admitting: General Surgery

## 2017-08-02 DIAGNOSIS — D0512 Intraductal carcinoma in situ of left breast: Secondary | ICD-10-CM

## 2017-08-06 ENCOUNTER — Other Ambulatory Visit: Payer: Self-pay | Admitting: *Deleted

## 2017-08-06 ENCOUNTER — Ambulatory Visit
Admission: RE | Admit: 2017-08-06 | Discharge: 2017-08-06 | Disposition: A | Discharge status: Home / Self Care | Payer: Medicare Other | Source: Ambulatory Visit | Attending: General Surgery | Admitting: General Surgery

## 2017-08-06 DIAGNOSIS — D0512 Intraductal carcinoma in situ of left breast: Secondary | ICD-10-CM

## 2017-08-06 NOTE — Progress Notes (Signed)
Ensure pre surgery drink given with instructions to complete by 0400 dos, pt verbalized understanding. 

## 2017-08-06 NOTE — Progress Notes (Signed)
b

## 2017-08-07 NOTE — Anesthesia Preprocedure Evaluation (Addendum)
Anesthesia Evaluation  Patient identified by MRN, date of birth, ID band Patient awake    Reviewed: Allergy & Precautions, NPO status , Patient's Chart, lab work & pertinent test results  Airway Mallampati: II       Dental  (+) Edentulous Upper   Pulmonary neg pulmonary ROS, former smoker,    Pulmonary exam normal        Cardiovascular Exercise Tolerance: Good hypertension, + CAD, + Past MI and + Peripheral Vascular Disease  + dysrhythmias Atrial Fibrillation  Rhythm:Regular Rate:Normal     Neuro/Psych negative neurological ROS     GI/Hepatic GERD  ,  Endo/Other  diabetes, Type 1  Renal/GU Renal disease     Musculoskeletal   Abdominal   Peds  Hematology negative hematology ROS (+)   Anesthesia Other Findings   Reproductive/Obstetrics                            Lab Results  Component Value Date   WBC 5.8 07/17/2017   HGB 13.5 03/31/2013   HCT 32.5 (L) 07/17/2017   MCV 92.6 07/17/2017   PLT 245 07/17/2017   Lab Results  Component Value Date   CREATININE 2.08 (H) 07/17/2017   BUN 23 07/17/2017   NA 142 07/17/2017   K 4.0 07/17/2017   CL 103 07/17/2017   CO2 30 (H) 07/17/2017    Anesthesia Physical Anesthesia Plan  ASA: III  Anesthesia Plan: General   Post-op Pain Management:    Induction:   PONV Risk Score and Plan: 4 or greater and Treatment may vary due to age or medical condition, Ondansetron, Dexamethasone and Midazolam  Airway Management Planned: LMA, Natural Airway and Nasal Cannula  Additional Equipment:   Intra-op Plan:   Post-operative Plan: Extubation in OR  Informed Consent: I have reviewed the patients History and Physical, chart, labs and discussed the procedure including the risks, benefits and alternatives for the proposed anesthesia with the patient or authorized representative who has indicated his/her understanding and acceptance.   Dental  advisory given  Plan Discussed with: CRNA and Anesthesiologist  Anesthesia Plan Comments:        Anesthesia Quick Evaluation

## 2017-08-08 ENCOUNTER — Ambulatory Visit (HOSPITAL_BASED_OUTPATIENT_CLINIC_OR_DEPARTMENT_OTHER): Payer: Medicare Other | Admitting: Anesthesiology

## 2017-08-08 ENCOUNTER — Encounter (HOSPITAL_COMMUNITY)
Admission: RE | Admit: 2017-08-08 | Discharge: 2017-08-08 | Disposition: A | Discharge status: Home / Self Care | Payer: Medicare Other | Source: Ambulatory Visit | Attending: General Surgery | Admitting: General Surgery

## 2017-08-08 ENCOUNTER — Ambulatory Visit
Admission: RE | Admit: 2017-08-08 | Discharge: 2017-08-08 | Disposition: A | Discharge status: Home / Self Care | Payer: Medicare Other | Source: Ambulatory Visit | Attending: General Surgery | Admitting: General Surgery

## 2017-08-08 ENCOUNTER — Encounter: Payer: Self-pay | Admitting: Radiation Oncology

## 2017-08-08 ENCOUNTER — Encounter (HOSPITAL_BASED_OUTPATIENT_CLINIC_OR_DEPARTMENT_OTHER)
Admission: RE | Disposition: A | Discharge status: Home / Self Care | Payer: Self-pay | Source: Ambulatory Visit | Attending: General Surgery

## 2017-08-08 ENCOUNTER — Ambulatory Visit (HOSPITAL_BASED_OUTPATIENT_CLINIC_OR_DEPARTMENT_OTHER)
Admission: RE | Admit: 2017-08-08 | Discharge: 2017-08-08 | Disposition: A | Discharge status: Home / Self Care | Payer: Medicare Other | Source: Ambulatory Visit | Attending: General Surgery | Admitting: General Surgery

## 2017-08-08 ENCOUNTER — Encounter (HOSPITAL_BASED_OUTPATIENT_CLINIC_OR_DEPARTMENT_OTHER): Payer: Self-pay | Admitting: Certified Registered"

## 2017-08-08 DIAGNOSIS — Z7901 Long term (current) use of anticoagulants: Secondary | ICD-10-CM | POA: Diagnosis not present

## 2017-08-08 DIAGNOSIS — D0512 Intraductal carcinoma in situ of left breast: Secondary | ICD-10-CM | POA: Diagnosis present

## 2017-08-08 DIAGNOSIS — E1051 Type 1 diabetes mellitus with diabetic peripheral angiopathy without gangrene: Secondary | ICD-10-CM | POA: Diagnosis not present

## 2017-08-08 DIAGNOSIS — Z951 Presence of aortocoronary bypass graft: Secondary | ICD-10-CM | POA: Insufficient documentation

## 2017-08-08 DIAGNOSIS — I252 Old myocardial infarction: Secondary | ICD-10-CM | POA: Insufficient documentation

## 2017-08-08 DIAGNOSIS — Z87891 Personal history of nicotine dependence: Secondary | ICD-10-CM | POA: Diagnosis not present

## 2017-08-08 DIAGNOSIS — I10 Essential (primary) hypertension: Secondary | ICD-10-CM | POA: Diagnosis not present

## 2017-08-08 DIAGNOSIS — Z952 Presence of prosthetic heart valve: Secondary | ICD-10-CM | POA: Insufficient documentation

## 2017-08-08 DIAGNOSIS — I6529 Occlusion and stenosis of unspecified carotid artery: Secondary | ICD-10-CM | POA: Insufficient documentation

## 2017-08-08 DIAGNOSIS — I4891 Unspecified atrial fibrillation: Secondary | ICD-10-CM | POA: Diagnosis not present

## 2017-08-08 HISTORY — PX: BREAST LUMPECTOMY WITH RADIOACTIVE SEED AND SENTINEL LYMPH NODE BIOPSY: SHX6550

## 2017-08-08 LAB — POCT I-STAT, CHEM 8
BUN: 31 mg/dL — AB (ref 6–20)
CALCIUM ION: 1.21 mmol/L (ref 1.15–1.40)
CHLORIDE: 102 mmol/L (ref 101–111)
Creatinine, Ser: 2 mg/dL — ABNORMAL HIGH (ref 0.44–1.00)
Glucose, Bld: 89 mg/dL (ref 65–99)
HCT: 33 % — ABNORMAL LOW (ref 36.0–46.0)
Hemoglobin: 11.2 g/dL — ABNORMAL LOW (ref 12.0–15.0)
POTASSIUM: 3.6 mmol/L (ref 3.5–5.1)
Sodium: 140 mmol/L (ref 135–145)
TCO2: 25 mmol/L (ref 22–32)

## 2017-08-08 LAB — GLUCOSE, CAPILLARY: GLUCOSE-CAPILLARY: 92 mg/dL (ref 65–99)

## 2017-08-08 SURGERY — BREAST LUMPECTOMY WITH RADIOACTIVE SEED AND SENTINEL LYMPH NODE BIOPSY
Anesthesia: General | Site: Breast | Laterality: Left

## 2017-08-08 MED ORDER — PROMETHAZINE HCL 25 MG/ML IJ SOLN
6.2500 mg | Freq: Once | INTRAMUSCULAR | Status: AC | PRN
  Administered 2017-08-08: 6.25 mg via INTRAVENOUS

## 2017-08-08 MED ORDER — MIDAZOLAM HCL 2 MG/2ML IJ SOLN
1.0000 mg | INTRAMUSCULAR | Status: DC | PRN
  Administered 2017-08-08: 1 mg via INTRAVENOUS

## 2017-08-08 MED ORDER — GABAPENTIN 300 MG PO CAPS
300.0000 mg | ORAL_CAPSULE | ORAL | Status: AC
  Administered 2017-08-08: 300 mg via ORAL

## 2017-08-08 MED ORDER — LACTATED RINGERS IV SOLN
INTRAVENOUS | Status: DC
  Administered 2017-08-08 (×2): via INTRAVENOUS

## 2017-08-08 MED ORDER — FENTANYL CITRATE (PF) 100 MCG/2ML IJ SOLN
INTRAMUSCULAR | Status: DC | PRN
  Administered 2017-08-08: 25 ug via INTRAVENOUS

## 2017-08-08 MED ORDER — CHLORHEXIDINE GLUCONATE CLOTH 2 % EX PADS: 6.0000 | MEDICATED_PAD | Freq: Once | CUTANEOUS | Status: DC

## 2017-08-08 MED ORDER — EPHEDRINE SULFATE 50 MG/ML IJ SOLN
INTRAMUSCULAR | Status: DC | PRN
  Administered 2017-08-08 (×4): 10 mg via INTRAVENOUS

## 2017-08-08 MED ORDER — CELECOXIB 200 MG PO CAPS
ORAL_CAPSULE | ORAL | Status: AC
  Filled 2017-08-08: qty 1

## 2017-08-08 MED ORDER — CEFAZOLIN SODIUM-DEXTROSE 2-4 GM/100ML-% IV SOLN
2.0000 g | INTRAVENOUS | Status: AC
  Administered 2017-08-08: 2 g via INTRAVENOUS

## 2017-08-08 MED ORDER — DEXAMETHASONE SODIUM PHOSPHATE 4 MG/ML IJ SOLN
INTRAMUSCULAR | Status: DC | PRN
  Administered 2017-08-08: 6 mg via INTRAVENOUS

## 2017-08-08 MED ORDER — BUPIVACAINE HCL (PF) 0.25 % IJ SOLN
INTRAMUSCULAR | Status: AC
  Filled 2017-08-08: qty 60

## 2017-08-08 MED ORDER — GABAPENTIN 300 MG PO CAPS
ORAL_CAPSULE | ORAL | Status: AC
  Filled 2017-08-08: qty 1

## 2017-08-08 MED ORDER — HYDROMORPHONE HCL 1 MG/ML IJ SOLN
0.2500 mg | INTRAMUSCULAR | Status: DC | PRN
  Administered 2017-08-08: 0.25 mg via INTRAVENOUS

## 2017-08-08 MED ORDER — HYDROCODONE-ACETAMINOPHEN 5-325 MG PO TABS: 1.0000 | ORAL_TABLET | Freq: Four times a day (QID) | ORAL | 0 refills | Status: DC | PRN

## 2017-08-08 MED ORDER — METHYLENE BLUE 0.5 % INJ SOLN
INTRAVENOUS | Status: AC
  Filled 2017-08-08: qty 30

## 2017-08-08 MED ORDER — FENTANYL CITRATE (PF) 100 MCG/2ML IJ SOLN
INTRAMUSCULAR | Status: AC
  Filled 2017-08-08: qty 2

## 2017-08-08 MED ORDER — MEPERIDINE HCL 25 MG/ML IJ SOLN: 6.2500 mg | INTRAMUSCULAR | Status: DC | PRN

## 2017-08-08 MED ORDER — ONDANSETRON HCL 4 MG/2ML IJ SOLN
INTRAMUSCULAR | Status: DC | PRN
  Administered 2017-08-08: 4 mg via INTRAVENOUS

## 2017-08-08 MED ORDER — HYDROMORPHONE HCL 1 MG/ML IJ SOLN
INTRAMUSCULAR | Status: AC
  Filled 2017-08-08: qty 0.5

## 2017-08-08 MED ORDER — CELECOXIB 200 MG PO CAPS
200.0000 mg | ORAL_CAPSULE | ORAL | Status: AC
  Administered 2017-08-08: 200 mg via ORAL

## 2017-08-08 MED ORDER — LIDOCAINE HCL (CARDIAC) 20 MG/ML IV SOLN
INTRAVENOUS | Status: DC | PRN
  Administered 2017-08-08: 30 mg via INTRAVENOUS

## 2017-08-08 MED ORDER — BUPIVACAINE HCL (PF) 0.5 % IJ SOLN
INTRAMUSCULAR | Status: AC
  Filled 2017-08-08: qty 30

## 2017-08-08 MED ORDER — SODIUM CHLORIDE 0.9 % IJ SOLN
INTRAMUSCULAR | Status: AC
  Filled 2017-08-08: qty 30

## 2017-08-08 MED ORDER — ACETAMINOPHEN 500 MG PO TABS
ORAL_TABLET | ORAL | Status: AC
  Filled 2017-08-08: qty 2

## 2017-08-08 MED ORDER — ROPIVACAINE HCL 5 MG/ML IJ SOLN
INTRAMUSCULAR | Status: DC | PRN
  Administered 2017-08-08: 20 mL

## 2017-08-08 MED ORDER — ACETAMINOPHEN 500 MG PO TABS
1000.0000 mg | ORAL_TABLET | ORAL | Status: AC
  Administered 2017-08-08: 1000 mg via ORAL

## 2017-08-08 MED ORDER — SCOPOLAMINE 1 MG/3DAYS TD PT72: 1.0000 | MEDICATED_PATCH | Freq: Once | TRANSDERMAL | Status: DC | PRN

## 2017-08-08 MED ORDER — FENTANYL CITRATE (PF) 100 MCG/2ML IJ SOLN
50.0000 ug | INTRAMUSCULAR | Status: DC | PRN
  Administered 2017-08-08: 50 ug via INTRAVENOUS

## 2017-08-08 MED ORDER — BUPIVACAINE-EPINEPHRINE (PF) 0.25% -1:200000 IJ SOLN
INTRAMUSCULAR | Status: AC
  Filled 2017-08-08: qty 30

## 2017-08-08 MED ORDER — TECHNETIUM TC 99M SULFUR COLLOID FILTERED
1.0000 | Freq: Once | INTRAVENOUS | Status: AC | PRN
  Administered 2017-08-08: 1 via INTRADERMAL

## 2017-08-08 MED ORDER — ACETAMINOPHEN 10 MG/ML IV SOLN: 1000.0000 mg | Freq: Once | INTRAVENOUS | Status: DC | PRN

## 2017-08-08 MED ORDER — CEFAZOLIN SODIUM-DEXTROSE 2-4 GM/100ML-% IV SOLN
INTRAVENOUS | Status: AC
  Filled 2017-08-08: qty 100

## 2017-08-08 MED ORDER — PROMETHAZINE HCL 25 MG/ML IJ SOLN
INTRAMUSCULAR | Status: AC
  Filled 2017-08-08: qty 1

## 2017-08-08 MED ORDER — PROPOFOL 10 MG/ML IV BOLUS
INTRAVENOUS | Status: DC | PRN
  Administered 2017-08-08: 120 mg via INTRAVENOUS

## 2017-08-08 MED ORDER — BUPIVACAINE HCL (PF) 0.25 % IJ SOLN
INTRAMUSCULAR | Status: DC | PRN
  Administered 2017-08-08: 10 mL

## 2017-08-08 MED ORDER — MIDAZOLAM HCL 2 MG/2ML IJ SOLN
INTRAMUSCULAR | Status: AC
  Filled 2017-08-08: qty 2

## 2017-08-08 MED ORDER — HYDROCODONE-ACETAMINOPHEN 7.5-325 MG PO TABS: 1.0000 | ORAL_TABLET | Freq: Once | ORAL | Status: DC | PRN

## 2017-08-08 SURGICAL SUPPLY — 50 items
APPLIER CLIP 9.375 MED OPEN (MISCELLANEOUS) ×3
BLADE SURG 15 STRL LF DISP TIS (BLADE) ×1 IMPLANT
BLADE SURG 15 STRL SS (BLADE) ×2
CANISTER SUC SOCK COL 7IN (MISCELLANEOUS) IMPLANT
CANISTER SUCT 1200ML W/VALVE (MISCELLANEOUS) ×3 IMPLANT
CHLORAPREP W/TINT 26ML (MISCELLANEOUS) ×3 IMPLANT
CLIP APPLIE 9.375 MED OPEN (MISCELLANEOUS) ×1 IMPLANT
COVER BACK TABLE 60X90IN (DRAPES) ×3 IMPLANT
COVER MAYO STAND STRL (DRAPES) ×3 IMPLANT
COVER PROBE W GEL 5X96 (DRAPES) ×3 IMPLANT
DECANTER SPIKE VIAL GLASS SM (MISCELLANEOUS) IMPLANT
DERMABOND ADVANCED (GAUZE/BANDAGES/DRESSINGS) ×2
DERMABOND ADVANCED .7 DNX12 (GAUZE/BANDAGES/DRESSINGS) ×1 IMPLANT
DEVICE DUBIN W/COMP PLATE 8390 (MISCELLANEOUS) ×3 IMPLANT
DRAPE LAPAROSCOPIC ABDOMINAL (DRAPES) ×3 IMPLANT
DRAPE UTILITY XL STRL (DRAPES) ×3 IMPLANT
ELECT COATED BLADE 2.86 ST (ELECTRODE) ×3 IMPLANT
ELECT REM PT RETURN 9FT ADLT (ELECTROSURGICAL) ×3
ELECTRODE REM PT RTRN 9FT ADLT (ELECTROSURGICAL) ×1 IMPLANT
GLOVE BIO SURGEON STRL SZ7.5 (GLOVE) ×3 IMPLANT
GLOVE BIOGEL PI IND STRL 7.0 (GLOVE) ×2 IMPLANT
GLOVE BIOGEL PI IND STRL 8 (GLOVE) ×1 IMPLANT
GLOVE BIOGEL PI INDICATOR 7.0 (GLOVE) ×4
GLOVE BIOGEL PI INDICATOR 8 (GLOVE) ×2
GLOVE ECLIPSE 6.5 STRL STRAW (GLOVE) ×3 IMPLANT
GLOVE SURG SS PI 7.5 STRL IVOR (GLOVE) ×3 IMPLANT
GOWN STRL REUS W/ TWL LRG LVL3 (GOWN DISPOSABLE) ×2 IMPLANT
GOWN STRL REUS W/ TWL XL LVL3 (GOWN DISPOSABLE) ×1 IMPLANT
GOWN STRL REUS W/TWL LRG LVL3 (GOWN DISPOSABLE) ×4
GOWN STRL REUS W/TWL XL LVL3 (GOWN DISPOSABLE) ×2
ILLUMINATOR WAVEGUIDE N/F (MISCELLANEOUS) IMPLANT
KIT MARKER MARGIN INK (KITS) ×3 IMPLANT
LIGHT WAVEGUIDE WIDE FLAT (MISCELLANEOUS) IMPLANT
NDL SAFETY ECLIPSE 18X1.5 (NEEDLE) IMPLANT
NEEDLE HYPO 18GX1.5 SHARP (NEEDLE)
NEEDLE HYPO 25X1 1.5 SAFETY (NEEDLE) ×3 IMPLANT
NS IRRIG 1000ML POUR BTL (IV SOLUTION) ×3 IMPLANT
PACK BASIN DAY SURGERY FS (CUSTOM PROCEDURE TRAY) ×3 IMPLANT
PENCIL BUTTON HOLSTER BLD 10FT (ELECTRODE) ×3 IMPLANT
SLEEVE SCD COMPRESS KNEE MED (MISCELLANEOUS) ×3 IMPLANT
SPONGE LAP 18X18 X RAY DECT (DISPOSABLE) ×3 IMPLANT
SUT MON AB 4-0 PC3 18 (SUTURE) ×6 IMPLANT
SUT SILK 2 0 SH (SUTURE) IMPLANT
SUT VICRYL 3-0 CR8 SH (SUTURE) ×3 IMPLANT
SYR CONTROL 10ML LL (SYRINGE) ×3 IMPLANT
TOWEL OR 17X24 6PK STRL BLUE (TOWEL DISPOSABLE) ×3 IMPLANT
TOWEL OR NON WOVEN STRL DISP B (DISPOSABLE) ×3 IMPLANT
TUBE CONNECTING 20'X1/4 (TUBING) ×1
TUBE CONNECTING 20X1/4 (TUBING) ×2 IMPLANT
YANKAUER SUCT BULB TIP NO VENT (SUCTIONS) ×3 IMPLANT

## 2017-08-08 NOTE — Interval H&P Note (Signed)
History and Physical Interval Note:  08/08/2017 7:20 AM  [name]  has presented today for surgery, with the diagnosis of LEFT BREAST CANCER  The various methods of treatment have been discussed with the patient and family. After consideration of risks, benefits and other options for treatment, the patient has consented to  Procedure(s): LEFT BREAST LUMPECTOMY WITH RADIOACTIVE SEED AND SENTINEL LYMPH NODE BIOPSY (Left) as a surgical intervention .  The patient's history has been reviewed, patient examined, no change in status, stable for surgery.  I have reviewed the patient's chart and labs.  Questions were answered to the patient's satisfaction.     TOTH III,PAUL S

## 2017-08-08 NOTE — H&P (Signed)
[name]  Location: [facility] Surgery Patient #: [id number] DOB: [birth year] Undefined / Language: [name] / Race: Black or African American Female   History of Present Illness  The patient is a 67 year old female who presents with breast cancer. We are asked to see the patient in consultation by Dr. Sondra Come to evaluate her for a new left breast cancer. The patient is a 67 yo bf who presents with a screen detected mass with calcification in the lower inner quadrant of the left breast. It measured 1.9cm. It was biopsied and came back as DCIS with some areas suspicious for invasion. It was ER and PR -. She does have a h/o CABG and carotid stenosis and is on Eliquis.   Past Surgical History Breast Biopsy  Left. Cataract Surgery  Left. Coronary Artery Bypass Graft  Hysterectomy (due to cancer) - Partial  Valve Replacement   Diagnostic Studies History  Colonoscopy  1-5 years ago Mammogram  within last year Pap Smear  1-5 years ago  Medication History  Medications Reconciled  Social History  Alcohol use  Remotely quit alcohol use. Caffeine use  Coffee. No drug use  Tobacco use  Former smoker.  Family History  Bleeding disorder  Sister. Diabetes Mellitus  Sister. Heart Disease  Sister. Hypertension  Sister.  Other Problems Cancer  Diabetes Mellitus  Gastroesophageal Reflux Disease  High blood pressure  Hypercholesterolemia  Myocardial infarction  Other disease, cancer, significant illness  Thyroid Disease     Review of Systems  General Present- Appetite Loss and Weight Loss. Not Present- Chills, Fatigue, Fever, Night Sweats and Weight Gain. Skin Not Present- Change in Wart/Mole, Dryness, Hives, Jaundice, New Lesions, Non-Healing Wounds, Rash and Ulcer. HEENT Present- Seasonal Allergies. Not Present- Earache, Hearing Loss, Hoarseness, Nose Bleed, Oral Ulcers, Ringing in the Ears, Sinus Pain, Sore Throat, Visual Disturbances, Wears  glasses/contact lenses and Yellow Eyes. Respiratory Not Present- Bloody sputum, Chronic Cough, Difficulty Breathing, Snoring and Wheezing. Breast Present- Breast Mass. Not Present- Breast Pain, Nipple Discharge and Skin Changes. Cardiovascular Not Present- Chest Pain, Difficulty Breathing Lying Down, Leg Cramps, Palpitations, Rapid Heart Rate, Shortness of Breath and Swelling of Extremities. Gastrointestinal Not Present- Abdominal Pain, Bloating, Bloody Stool, Change in Bowel Habits, Chronic diarrhea, Constipation, Difficulty Swallowing, Excessive gas, Gets full quickly at meals, Hemorrhoids, Indigestion, Nausea, Rectal Pain and Vomiting. Female Genitourinary Not Present- Frequency, Nocturia, Painful Urination, Pelvic Pain and Urgency. Musculoskeletal Not Present- Back Pain, Joint Pain, Joint Stiffness, Muscle Pain, Muscle Weakness and Swelling of Extremities. Neurological Not Present- Decreased Memory, Fainting, Headaches, Numbness, Seizures, Tingling, Tremor, Trouble walking and Weakness. Psychiatric Not Present- Anxiety, Bipolar, Change in Sleep Pattern, Depression, Fearful and Frequent crying. Endocrine Not Present- Cold Intolerance, Excessive Hunger, Hair Changes, Heat Intolerance, Hot flashes and New Diabetes. Hematology Present- Blood Thinners and Easy Bruising. Not Present- Excessive bleeding, Gland problems, HIV and Persistent Infections.   Physical Exam  General Mental Status-Alert. General Appearance-Consistent with stated age. Hydration-Well hydrated. Voice-Normal.  Head and Neck Head-normocephalic, atraumatic with no lesions or palpable masses. Trachea-midline. Thyroid Gland Characteristics - normal size and consistency.  Eye Eyeball - Bilateral-Extraocular movements intact. Sclera/Conjunctiva - Bilateral-No scleral icterus.  Chest and Lung Exam Chest and lung exam reveals -quiet, even and easy respiratory effort with no use of accessory muscles and on  auscultation, normal breath sounds, no adventitious sounds and normal vocal resonance. Inspection Chest Wall - Normal. Back - normal.  Breast Note: There is a palpable bruise in the lower inner quadrant  of the left breast. There is a couple small mobile lymph nodes in both axillas. There is no supraclavicular or cervical lymphadenopathy   Cardiovascular Cardiovascular examination reveals -normal heart sounds, regular rate and rhythm with no murmurs and normal pedal pulses bilaterally.  Abdomen Inspection Inspection of the abdomen reveals - No Hernias. Skin - Scar - no surgical scars. Palpation/Percussion Palpation and Percussion of the abdomen reveal - Soft, Non Tender, No Rebound tenderness, No Rigidity (guarding) and No hepatosplenomegaly. Auscultation Auscultation of the abdomen reveals - Bowel sounds normal.  Neurologic Neurologic evaluation reveals -alert and oriented x 3 with no impairment of recent or remote memory. Mental Status-Normal.  Musculoskeletal Normal Exam - Left-Upper Extremity Strength Normal and Lower Extremity Strength Normal. Normal Exam - Right-Upper Extremity Strength Normal and Lower Extremity Strength Normal.  Lymphatic Head & Neck  General Head & Neck Lymphatics: Bilateral - Description - Normal. Axillary  General Axillary Region: Bilateral - Description - Normal. Tenderness - Non Tender. Femoral & Inguinal  Generalized Femoral & Inguinal Lymphatics: Bilateral - Description - Normal. Tenderness - Non Tender.    Assessment & Plan  DUCTAL CARCINOMA IN SITU (DCIS) OF LEFT BREAST (D05.12) Impression: The patient appears to have a 1.9cm area of dcis in the lower inner left breast with areas suspicious for invasion. I have discussed with her the different options for treatment and at this point she favors breast conservation. I think this is a reasonable way of treating her cancer. She will need a node evaluation because it is suspicious. She  is also a candidate for sentinel node mapping. She will need cardiac clearance. I have discussed with her the risks and benefits of the surgery as well as some of the technical aspects and she understands and wishes to proceed

## 2017-08-08 NOTE — Discharge Instructions (Signed)

## 2017-08-08 NOTE — Transfer of Care (Signed)
Immediate Anesthesia Transfer of Care Note  Patient: [name]  Procedure(s) Performed: LEFT BREAST LUMPECTOMY WITH RADIOACTIVE SEED AND SENTINEL LYMPH NODE BIOPSY (Left Breast)  Patient Location: PACU  Anesthesia Type:GA combined with regional for post-op pain  Level of Consciousness: awake and patient cooperative  Airway & Oxygen Therapy: Patient Spontanous Breathing and Patient connected to face mask oxygen  Post-op Assessment: Report given to RN and Post -op Vital signs reviewed and stable  Post vital signs: Reviewed and stable  Last Vitals:  Vitals:   08/08/17 0724 08/08/17 0730  BP: (!) 129/93   Pulse: 68 (!) 54  Resp: (!) 22 17  Temp:    SpO2: 100% 94%    Last Pain:  Vitals:   08/08/17 0644  TempSrc: Oral         Complications: No apparent anesthesia complications

## 2017-08-08 NOTE — Progress Notes (Signed)
Assisted Dr Valma Cava with left, ultrasound guided, pectoralis block and nuc med tech with nuc med inj. Side rails up, monitors on throughout procedure. See vital signs in flow sheet. Tolerated Procedure well.

## 2017-08-08 NOTE — Anesthesia Procedure Notes (Signed)
Anesthesia Regional Block: Pectoralis block   Pre-Anesthetic Checklist: ,, timeout performed, Correct Patient, Correct Site, Correct Laterality, Correct Procedure, Correct Position, site marked, Risks and benefits discussed,  Surgical consent,  Pre-op evaluation,  At surgeon's request and post-op pain management  Laterality: Left  Prep: chloraprep       Needles:  Injection technique: Single-shot  Needle Type: Echogenic Needle     Needle Length: 9cm  Needle Gauge: 21     Additional Needles:   Narrative:  Start time: 08/08/2017 7:15 AM End time: 08/08/2017 7:21 AM Injection made incrementally with aspirations every 5 mL.  Performed by: Personally  Anesthesiologist: Barnet Glasgow, MD

## 2017-08-08 NOTE — Op Note (Signed)
08/08/2017  9:02 AM  PATIENT:  [name]  67 y.o. female  PRE-OPERATIVE DIAGNOSIS:  LEFT BREAST CANCER  POST-OPERATIVE DIAGNOSIS:  LEFT BREAST CANCER  PROCEDURE:  Procedure(s): LEFT BREAST LUMPECTOMY WITH RADIOACTIVE SEED AND DEEP LEFT AXILLARY SENTINEL LYMPH NODE BIOPSY (Left)  SURGEON:  Surgeon(s) and Role:    * Jovita Kussmaul, MD - Primary  PHYSICIAN ASSISTANT:   ASSISTANTS: none   ANESTHESIA:   local and general  EBL:  minimal   BLOOD ADMINISTERED:none  DRAINS: none   LOCAL MEDICATIONS USED:  MARCAINE     SPECIMEN:  Source of Specimen:  left breast tissue with sentinel nodes X 2  DISPOSITION OF SPECIMEN:  PATHOLOGY  COUNTS:  YES  TOURNIQUET:  * No tourniquets in log *  DICTATION: .Dragon Dictation   After informed consent was obtained the patient was brought to the operating room and placed in the supine position on the operating table.  After adequate induction of general anesthesia the patient's left chest, breast, and axillary area were prepped with ChloraPrep, allowed to dry, and draped in usual sterile manner.  An appropriate timeout was performed.  Previously an I-125 seed was placed in the inner aspect of the left breast to mark an area of ductal carcinoma in situ with some areas that were suspicious for invasion.  Earlier in the day the patient underwent injection of 1 mCi of technetium sulfur colloid in the subareolar position on the left.  The neoprobe was set to technetium in the area of radioactivity in the left axilla was readily identified.  This area was infiltrated with quarter percent Marcaine.  A small transversely oriented incision was made overlying the area of radioactivity with a 15 blade knife.  The incision was carried through the skin and subcutaneous tissue sharply with electrocautery until the deep left axillary space was entered.  Blunt hemostat dissection was directed by the neoprobe until I was able to identify a hot lymph node.  This  lymph node was excised sharply with the electrocautery and the lymphatics were controlled with clips.  Ex vivo counts on this node were approximately 2000.  There was a second palpable node in the same area.  This was also excised sharply with the electrocautery and the lymphatics were controlled with clips.  There was no radioactivity in this node.  There were no other hot or palpable lymph nodes identified in the left axilla.  These nodes were sent as sentinel nodes numbers 1 and 2.  The deep layer of the wound was then closed with interrupted 3-0 Vicryl stitches.  The skin was then closed with a running 4-0 Monocryl subcuticular stitch.  Attention was then turned to the left breast.  The area of the cancer was fairly large along the center portion of the left breast.  Because of this I decided to not try to hide the scar but instead make an elliptical incision overlying the area of radial activity.  The neoprobe was set to I-125 in the area of radioactivity was readily identified.  The incision was carried through the skin and subcutaneous tissue sharply with electrocautery.  The dissection was carried around the radioactive seed while checking the area of radioactivity frequently with the neoprobe.  The dissection was carried all the way to the chest wall.  Once the specimen was removed it was oriented with the appropriate paint colors.  A specimen radiograph was obtained that showed the clip and seed to be in the center of the  specimen.  The specimen was then sent to pathology for further evaluation.  Hemostasis was achieved using the Bovie electrocautery.  The cavity was irrigated with saline and then marked with clips.  The cavity was also infiltrated with quarter percent Marcaine.  The deep layer of the wound was then closed with layers of interrupted 3-0 Vicryl stitches.  The skin was then closed with a running 4-0 Monocryl subcuticular stitch.  Dermabond dressings were applied.  The patient tolerated the  procedure well.  At the end of the case all needle sponge and instrument counts were correct.  The patient was then awakened and taken to recovery in stable condition.  PLAN OF CARE: Discharge to home after PACU  PATIENT DISPOSITION:  PACU - hemodynamically stable.   Delay start of Pharmacological VTE agent (>24hrs) due to surgical blood loss or risk of bleeding: not applicable

## 2017-08-08 NOTE — Anesthesia Postprocedure Evaluation (Signed)
Anesthesia Post Note  Patient: [name]  Procedure(s) Performed: LEFT BREAST LUMPECTOMY WITH RADIOACTIVE SEED AND SENTINEL LYMPH NODE BIOPSY (Left Breast)     Patient location during evaluation: PACU Anesthesia Type: General Level of consciousness: awake and alert Pain management: pain level controlled Vital Signs Assessment: post-procedure vital signs reviewed and stable Respiratory status: spontaneous breathing, nonlabored ventilation, respiratory function stable and patient connected to nasal cannula oxygen Cardiovascular status: blood pressure returned to baseline and stable Postop Assessment: no apparent nausea or vomiting Anesthetic complications: no    Last Vitals:  Vitals:   08/08/17 1045 08/08/17 1115  BP: 118/71 117/62  Pulse: 82 86  Resp: 14 18  Temp:  36.4 C  SpO2: 99% 100%    Last Pain:  Vitals:   08/08/17 1115  TempSrc:   PainSc: 3                  Barnet Glasgow

## 2017-08-08 NOTE — Anesthesia Procedure Notes (Signed)
Procedure Name: LMA Insertion Date/Time: 08/08/2017 7:39 AM Performed by: Cheryel Kyte, Ernesta Amble, CRNA Pre-anesthesia Checklist: Patient identified, Emergency Drugs available, Suction available and Patient being monitored Patient Re-evaluated:Patient Re-evaluated prior to induction Oxygen Delivery Method: Circle system utilized Preoxygenation: Pre-oxygenation with 100% oxygen Induction Type: IV induction Ventilation: Mask ventilation without difficulty LMA: LMA inserted LMA Size: 4.0 Number of attempts: 1 Airway Equipment and Method: Bite block Placement Confirmation: positive ETCO2 Tube secured with: Tape Dental Injury: Teeth and Oropharynx as per pre-operative assessment

## 2017-08-09 ENCOUNTER — Encounter (HOSPITAL_BASED_OUTPATIENT_CLINIC_OR_DEPARTMENT_OTHER): Payer: Self-pay | Admitting: General Surgery

## 2017-08-16 ENCOUNTER — Encounter: Payer: Self-pay | Admitting: Radiation Oncology

## 2017-08-16 NOTE — Progress Notes (Addendum)
Location of Breast Cancer: carcinoma in situ (DCIS) of left breast  Histology per Pathology Report:   08/08/17 Diagnosis 1. Lymph node, sentinel, biopsy, Left Axillary #1 - NO CARCINOMA IDENTIFIED IN ONE LYMPH NODE (0/1) 2. Lymph node, sentinel, biopsy, Left Axillary #2 - NO CARCINOMA IDENTIFIED IN ONE LYMPH NODE (0/1) 3. Breast, lumpectomy, Left w/seed - DUCTAL CARCINOMA IN SITU, HIGH GRADE - PREVIOUS SURGICAL SITE CHANGES - SEE ONCOLOGY TABLE AND COMMENT BELOW  07/05/17 Diagnosis Breast, left, needle core biopsy, 9:00 o'clock - HIGH GRADE DUCTAL CARCINOMA IN SITU WITH FOCI SUSPICIOUS FOR MICROINVASION - NECROSIS AND CALCIFICATIONS  Receptor Status: ER(0%), PR (0%), Her2-neu (), Ki-()  Did patient present with symptoms (if so, please note symptoms) or was this found on screening mammography?: screening mammogram  Past/Anticipated interventions by surgeon, if any: 08/08/17 - Procedure: LEFT BREAST LUMPECTOMY WITH RADIOACTIVE SEED AND SENTINEL LYMPH NODE BIOPSY;  Surgeon: Jovita Kussmaul, MD  Past/Anticipated interventions by medical oncology, if any: none  Lymphedema issues, if any:  Swelling in axila area post surgical  Pain issues, if any: Pain in left axilla 7 out of 10, initially starts with swelling and then the pain comes.  Ice packs to axilla area help temporary.  No narcotics used for it presently.  SAFETY ISSUES:  Prior radiation? no  Pacemaker/ICD? no  Possible current pregnancy?no  Is the patient on methotrexate? no  Current Complaints / other details:  Mrs. [name] is a married female who presents today with consultation for left breast cancer.  She reports still having some swelling to the axilla area with pain associated.  Only distress or concern is the swelling and pain.  Vitals:   08/26/17 0820  BP: (!) 118/56  Pulse: 61  Resp: 18  Temp: 98.2 F (36.8 C)  TempSrc: Oral  SpO2: 100%  Weight: 162 lb 12.8 oz (73.8 kg)    Wt Readings from Last 3  Encounters:  08/26/17 162 lb 12.8 oz (73.8 kg)  08/08/17 162 lb 2 oz (73.5 kg)  07/29/17 161 lb (73 kg)       Jacqulyn Liner, RN 08/16/2017,8:10 AM

## 2017-08-26 ENCOUNTER — Ambulatory Visit
Admission: RE | Admit: 2017-08-26 | Discharge: 2017-08-26 | Disposition: A | Discharge status: Home / Self Care | Payer: Medicare Other | Source: Ambulatory Visit | Attending: Radiation Oncology | Admitting: Radiation Oncology

## 2017-08-26 ENCOUNTER — Encounter: Payer: Self-pay | Admitting: Radiation Oncology

## 2017-08-26 ENCOUNTER — Other Ambulatory Visit: Payer: Self-pay

## 2017-08-26 VITALS — BP 118/56 | HR 61 | Temp 98.2°F | Resp 18 | Wt 162.8 lb

## 2017-08-26 DIAGNOSIS — Z7901 Long term (current) use of anticoagulants: Secondary | ICD-10-CM | POA: Insufficient documentation

## 2017-08-26 DIAGNOSIS — Z7984 Long term (current) use of oral hypoglycemic drugs: Secondary | ICD-10-CM | POA: Diagnosis not present

## 2017-08-26 DIAGNOSIS — Z79899 Other long term (current) drug therapy: Secondary | ICD-10-CM | POA: Diagnosis not present

## 2017-08-26 DIAGNOSIS — D0512 Intraductal carcinoma in situ of left breast: Secondary | ICD-10-CM | POA: Diagnosis not present

## 2017-08-26 DIAGNOSIS — Z170 Estrogen receptor positive status [ER+]: Secondary | ICD-10-CM | POA: Diagnosis not present

## 2017-08-26 HISTORY — DX: Malignant neoplasm of unspecified site of left female breast: C50.912

## 2017-08-26 NOTE — Progress Notes (Signed)
Radiation Oncology         [phone] ________________________________  Name: [name] MRN: [id number]  Date: 08/26/2017  DOB: 11-Oct-1950  Re-Evaluation Visit Note  CC: Prince Solian, MD  Jovita Kussmaul, MD    ICD-10-CM   1. Ductal carcinoma in situ (DCIS) of left breast D05.12     Diagnosis: Stage 0 (cTis (DCIS), cN0, cM0, G3, ER: Negative, PR: Negative, HER2: Unknown) high-grade, left breast  Narrative:  The patient returns today for re-evaluation since breast clinic on 07/17/16. The patient underwent left breast lumpectomy with sentinel lymph node biopsy on 08/08/17. This revealed DCIS, high grade. No carcinoma was found in either of the two sampled lymph nodes (0/2). She reports swelling to the left axilla following surgery. She notes this has now become painful and reports 7/10 pain to the left axilla. Pain is temporarily alleviated with an ice pack and she is currently not using narcotics. She denies any swelling in the left arm or hand. She denies any tubing being placed or drainage during surgery. She reports she will return to see Dr. Marlou Starks tomorrow. She presents today to review the role of radiotherapy as part of her disease management.                           ALLERGIES:  has No Known Allergies.  Meds: Current Outpatient Medications  Medication Sig Dispense Refill  . amLODipine (NORVASC) 10 MG tablet TAKE 1 TABLET BY MOUTH  DAILY 90 tablet 0  . apixaban (ELIQUIS) 5 MG TABS tablet TAKE 1 TABLET(5 MG) BY MOUTH TWICE DAILY 60 tablet 1  . lansoprazole (PREVACID) 15 MG capsule Take 1 capsule (15 mg total) by mouth daily. 90 capsule 3  . metFORMIN (GLUCOPHAGE) 1000 MG tablet Take 1 tablet (1,000 mg total) by mouth 2 (two) times daily with a meal. 180 tablet 3  . methimazole (TAPAZOLE) 10 MG tablet Take 10 mg by mouth 2 (two) times daily.  5  . metoprolol tartrate (LOPRESSOR) 50 MG tablet TAKE 1 TABLET BY MOUTH  EVERY DAY 90 tablet 0  . rosuvastatin (CRESTOR) 40 MG  tablet TAKE 1 TABLET BY MOUTH  DAILY 90 tablet 0  . triamterene-hydrochlorothiazide (MAXZIDE-25) 37.5-25 MG tablet TAKE 1 TABLET BY MOUTH  DAILY 90 tablet 0  . valsartan (DIOVAN) 160 MG tablet TAKE 1 TABLET BY MOUTH TWO  TIMES DAILY 180 tablet 0  . HYDROcodone-acetaminophen (NORCO/VICODIN) 5-325 MG tablet Take 1-2 tablets by mouth every 6 (six) hours as needed for moderate pain or severe pain. (Patient not taking: Reported on 08/26/2017) 15 tablet 0  . ketorolac (ACULAR) 0.5 % ophthalmic solution Place 2 drops into the left eye 2 (two) times daily.  6  . nitroGLYCERIN (NITROSTAT) 0.4 MG SL tablet Place 1 tablet (0.4 mg total) under the tongue every 5 (five) minutes as needed for chest pain. (Patient not taking: Reported on 08/26/2017) 25 tablet 6   No current facility-administered medications for this encounter.     Physical Findings: The patient is in no acute distress. Patient is alert and oriented.  weight is 162 lb 12.8 oz (73.8 kg). Her oral temperature is 98.2 F (36.8 C). Her blood pressure is 118/56 (abnormal) and her pulse is 61. Her respiration is 18 and oxygen saturation is 100%. .  No significant changes. Lungs are clear to auscultation bilaterally. Heart has regular rate and rhythm. No palpable cervical, supraclavicular, or axillary adenopathy. Abdomen soft, non-tender, normal  bowel sounds. Good range of movement in the left shoulder. In the left breast, the patient has a horizontal scar in the 9 o'clock position that is healing well without signs of drainage or infection. Patient also has a separate scar in the axillary region that is also healing well without signs of drainage or infection. Possibly some fluid underneath both scars.  Lab Findings: Lab Results  Component Value Date   WBC 5.8 07/17/2017   HGB 11.2 (L) 08/08/2017   HCT 33.0 (L) 08/08/2017   MCV 92.6 07/17/2017   PLT 245 07/17/2017    Radiographic Findings: Nm Sentinel Node Inj-no Rpt (breast)  Result Date:  08/08/2017 Sulfur colloid was injected by the nuclear medicine technologist for melanoma sentinel node.   Mm Breast Surgical Specimen  Result Date: 08/08/2017 CLINICAL DATA:  Status post lumpectomy today after earlier radioactive seed localization. EXAM: SPECIMEN RADIOGRAPH OF THE LEFT BREAST COMPARISON:  Previous exam(s). FINDINGS: Status post excision of the left breast. The radioactive seed and biopsy marker clip are present, completely intact, and were marked for pathology. The positions of the radioactive seed and biopsy marker clip within the specimen were discussed with the OR staff during the procedure. IMPRESSION: Specimen radiograph of the left breast. Electronically Signed   By: Franki Cabot M.D.   On: 08/08/2017 08:43   Mm Lt Radioactive Seed Loc Mammo Guide  Result Date: 08/06/2017 CLINICAL DATA:  Patient with a left breast cancer scheduled for breast conservation surgery requiring preoperative radioactive seed localization. EXAM: MAMMOGRAPHIC GUIDED RADIOACTIVE SEED LOCALIZATION OF THE LEFT BREAST COMPARISON:  Previous exam(s). FINDINGS: Patient presents for radioactive seed localization prior to breast conservation surgery. I met with the patient and we discussed the procedure of seed localization including benefits and alternatives. We discussed the high likelihood of a successful procedure. We discussed the risks of the procedure including infection, bleeding, tissue injury and further surgery. We discussed the low dose of radioactivity involved in the procedure. Informed, written consent was given. The usual time-out protocol was performed immediately prior to the procedure. Using mammographic guidance, sterile technique, 1% lidocaine and an I-125 radioactive seed, the ribbon shaped clip within the inner left breast was localized using a medial approach. The follow-up mammogram images confirm the seed in the expected location and were marked for Dr. Marlou Starks. Follow-up survey of the patient  confirms presence of the radioactive seed. Order number of I-125 seed:  [id number]. Total activity:  5.456 millicuries reference Date: 07/24/2017 The patient tolerated the procedure well and was released from the [facility]. She was given instructions regarding seed removal. IMPRESSION: Radioactive seed localization left breast. No apparent complications. Electronically Signed   By: Franki Cabot M.D.   On: 08/06/2017 15:40    Impression:  High-grade carcinoma in situ of the left breast. The patient's tumor measured 1.5 cm with clear margins. The closest margin being 0.9 cm. In light of the high grade nature of the malignancy I would recommend radiation therapy as part of her over all disease management. We discussed the course of treatment, side effects, and potential long-termtoxicities of radiotherapy. We discussed the possible use of deep breath inspiration technique if the treatment field is near the heart. She appears to understand and wishes to proceed with treatment. The patient appears to be a candidate hypofractionated accelerated radiation and I anticipate 4 weeks of radiotherapy to the left breast. A consent form was signed and a copy was provided to the patient.    Plan: CT simulation and  treatment planning is scheduled for 09/10/17 at 10 am. Anticipate treatment to begin 6 weeks post-op.  ____________________________________    This document serves as a record of services personally performed by Gery Pray, MD. It was created on his behalf by Bethann Humble, a trained medical scribe. The creation of this record is based on the scribe's personal observations and the provider's statements to them. This document has been checked and approved by the attending provider.

## 2017-09-06 ENCOUNTER — Other Ambulatory Visit: Payer: Self-pay | Admitting: Cardiovascular Disease

## 2017-09-06 NOTE — Telephone Encounter (Signed)
REFILL 

## 2017-09-09 ENCOUNTER — Other Ambulatory Visit: Payer: Self-pay

## 2017-09-09 ENCOUNTER — Encounter: Payer: Self-pay | Admitting: Physical Therapy

## 2017-09-09 ENCOUNTER — Ambulatory Visit: Payer: Medicare Other | Attending: General Surgery | Admitting: Physical Therapy

## 2017-09-09 DIAGNOSIS — D0512 Intraductal carcinoma in situ of left breast: Secondary | ICD-10-CM | POA: Diagnosis present

## 2017-09-09 DIAGNOSIS — M25612 Stiffness of left shoulder, not elsewhere classified: Secondary | ICD-10-CM | POA: Diagnosis present

## 2017-09-09 DIAGNOSIS — R293 Abnormal posture: Secondary | ICD-10-CM | POA: Insufficient documentation

## 2017-09-09 DIAGNOSIS — Z483 Aftercare following surgery for neoplasm: Secondary | ICD-10-CM | POA: Insufficient documentation

## 2017-09-09 NOTE — Patient Instructions (Signed)
Closed Chain: Shoulder Abduction / Adduction - on Wall    One hand on wall, step to side and return. Stepping causes shoulder to abduct and adduct. Step _5__ times,hold 5 seconds, __2-3_ times per day.  http://ss.exer.us/267   Copyright  VHI. All rights reserved.  Cane Exercise: Abduction    Hold cane with right hand over end, palm-up, with other hand palm-down. Move arm out from side and up by pushing with other arm. Hold __3__ seconds. Repeat __5__ times. Do __2-3__ sessions per day.  http://gt2.exer.us/82   Copyright  VHI. All rights reserved.

## 2017-09-09 NOTE — Therapy (Signed)
[place], [place], [address] Phone: [phone]   Fax:  [phone]  Physical Therapy Treatment  Patient Details  Name: [name] MRN: [id number] Date of Birth: [birth year] Referring Provider: Dr. Autumn Messing   Encounter Date: 09/09/2017  PT End of Session - 09/09/17 1444    Visit Number  2    Number of Visits  2    PT Start Time  8333    PT Stop Time  1443    PT Time Calculation (min)  38 min    Activity Tolerance  Patient tolerated treatment well    Behavior During Therapy  [facility] for tasks assessed/performed       Past Medical History:  Diagnosis Date  . Acute kidney failure ([place])   . ASHD (arteriosclerotic heart disease)    native vessel  . Atrial fibrillation ([place])   . Breast cancer, left ([place])   . CAD (coronary artery disease) 2004   multivessel, status post CABG x3  . Carotid artery disease ([place])    stent in 2013  . CKD (chronic kidney disease), stage III ([place])   . Diabetes ([place])   . GERD (gastroesophageal reflux disease)   . History of tobacco abuse   . Hx of adenomatous polyp of colon 07/26/2016  . Hyperlipidemia   . Hypertension   . Hyperthyroidism   . Myocardial infarction ([place]) 2002  . Peripheral vascular disease ([facility])    S/P popliteal artery and renal artery stents  . Renovascular hypertension   . S/P CABG x 3 2004  . Thyroid goiter   . Tobacco abuse   . Vaginal cancer ([place]) 1983    Past Surgical History:  Procedure Laterality Date  . BREAST LUMPECTOMY WITH RADIOACTIVE SEED AND SENTINEL LYMPH NODE BIOPSY Left 08/08/2017   Procedure: LEFT BREAST LUMPECTOMY WITH RADIOACTIVE SEED AND SENTINEL LYMPH NODE BIOPSY;  Surgeon: Jovita Kussmaul, MD;  Location: [facility];  Service: General;  Laterality: Left;  . CARDIAC CATHETERIZATION  09/23/2002   3 vessel CAD >> CABG (Dr. Marella Chimes)  . CARDIAC CATHETERIZATION  12/23/2003   patent LIMA, LAD, SVG to OM1, SVG to  RCA, distal disease beyond graft insertion (Dr. Marella Chimes)  . CARDIOVERSION  09/11/2011   Procedure: CARDIOVERSION;  Surgeon: Sanda Klein, MD;  Location: [facility];  Service: Cardiovascular;  Laterality: N/A;  . CAROTID ANGIOGRAM Bilateral 02/12/2012   Procedure: CAROTID ANGIOGRAM;  Surgeon: Serafina Mitchell, MD;  Location: [facility] CATH LAB;  Service: Cardiovascular;  Laterality: Bilateral;  . CAROTID DOPPLER  03/2013   right bulb & prox ICA w/50-69% stenosis, L ICA stent open/patent  . CAROTID STENT  02/12/2012   Carotid Stent Insertion, Carotid Angiogram - 9x40 Cordis Precise stent  (Dr. Pierre Bali)  . CAROTID STENT INSERTION N/A 02/12/2012   Procedure: CAROTID STENT INSERTION;  Surgeon: Serafina Mitchell, MD;  Location: [facility] CATH LAB;  Service: Cardiovascular;  Laterality: N/A;  . CORONARY ARTERY BYPASS GRAFT  09/24/2002   LIMA to LAD, SVG to Cfx, SVG to RCA (Dr. Prescott Gum)  . LOWER EXTREMITY ARTERIAL DOPPLER  09/2012   bilateral ABIS (mod arterial insuff at rest); R SFA 70-99% stenosis, L SFA 50-69% stenosis  . NM MYOCAR PERF WALL MOTION  12/2010   persnatine - moderate perfusion defect in apical anterior/apical/basal inferolateral/mid inferolateral/apical lateral walls - EF 65% - low risk  . RENAL ARTERY ANGIOPLASTY  02/11/2003   transluminal angioplasty & stent wtih 4x43m Genesis  balloon expandable stent premounted on Cordis aviatory balloon (Dr. Charlyne Petrin)  . RENAL DOPPLER  09/2012   R prox renal artery @ stent equal/less that 60% diameter reduction; L renal artery 1-59% stenosis  . TRANSTHORACIC ECHOCARDIOGRAM  07/2012   EF 55-65%, mild conc LVH, grade 1 diastolic dysfunction; calcicifed MV annulus, mild MR; LA mild-mod dilated; RA mod dilated; mod TR; PA peak pressure 42mHg, RVSP increased (pulm htn)    There were no vitals filed for this visit.  Subjective Assessment - 09/09/17 1411    Subjective  Patient underwent left lumpectomy and sentinel node biopsy (2 negative nodes) on 08/08/17. No chemo  is needed but begins radiation 09/10/17.    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 It measures 1.9 cm and is located in the lower inner quadrant. It is ER/PR negative. She had a CABG in 2002, has diabetes, hypertension, and thyroid dysfunction. Patient underwent left lumpectomy and sentinel node biopsy (2 negative nodes) on 08/08/17.    Patient Stated Goals  Make sure I'm doing well    Currently in Pain?  No/denies         [facility] - 09/09/17 0001      Assessment   Medical Diagnosis  s/p left lumpectomy SLNB    Referring Provider  Dr. PAutumn Messing   Onset Date/Surgical Date  08/08/17    Hand Dominance  Right    Prior Therapy  Baselines      Precautions   Precautions  Other (comment)    Precaution Comments  Recent cancer surgery; cardiax hx      Restrictions   Weight Bearing Restrictions  No      Balance Screen   Has the patient fallen in the past 6 months  No    Has the patient had a decrease in activity level because of a fear of falling?   No    Is the patient reluctant to leave their home because of a fear of falling?   No      Home Environment   Living Environment  Private residence    Living Arrangements  Spouse/significant other    Available Help at Discharge  Family      Prior Function   Level of [place] Retired    Leisure  She does chair exercises once a week and has gotten back to that recently.      Cognition   Overall Cognitive Status  Within Functional Limits for tasks assessed      Posture/Postural Control   Posture/Postural Control  Postural limitations    Postural Limitations  Rounded Shoulders;Forward head      ROM / Strength   AROM / PROM / Strength  AROM      AROM   AROM Assessment Site  Shoulder    Right/Left Shoulder  Left    Left Shoulder Extension  49 Degrees    Left Shoulder Flexion  126 Degrees    Left Shoulder ABduction  140 Degrees With c/o left lateral trunk tightness     Left Shoulder Internal Rotation  45 Degrees    Left Shoulder External Rotation  79 Degrees      Strength   Overall Strength  Within functional limits for tasks performed      Palpation   Palpation comment  Incision sites appear to be well healed in left breast and axilla        LYMPHEDEMA/ONCOLOGY QUESTIONNAIRE -  09/09/17 1418      Type   Cancer Type  Left breast cancer DCIS      Surgeries   Lumpectomy Date  08/08/17    Sentinel Lymph Node Biopsy Date  08/08/17    Number Lymph Nodes Removed  2      Treatment   Active Chemotherapy Treatment  No    Past Chemotherapy Treatment  No    Active Radiation Treatment  No Begins 09/10/17    Past Radiation Treatment  No    Current Hormone Treatment  No    Past Hormone Therapy  No      What other symptoms do you have   Are you Having Heaviness or Tightness  No    Are you having Pain  No    Are you having pitting edema  No    Is it Hard or Difficult finding clothes that fit  No    Do you have infections  No    Is there Decreased scar mobility  No    Stemmer Sign  No      Lymphedema Assessments   Lymphedema Assessments  Upper extremities      Right Upper Extremity Lymphedema   10 cm Proximal to Olecranon Process  27.1 cm    Olecranon Process  23.3 cm    10 cm Proximal to Ulnar Styloid Process  19.3 cm    Just Proximal to Ulnar Styloid Process  15.3 cm    Across Hand at [employer]  18.5 cm    At [place] of 2nd Digit  5.8 cm      Left Upper Extremity Lymphedema   10 cm Proximal to Olecranon Process  28 cm    Olecranon Process  23.8 cm    10 cm Proximal to Ulnar Styloid Process  19.3 cm    Just Proximal to Ulnar Styloid Process  15.3 cm    Across Hand at [employer]  17.8 cm    At [place] of 2nd Digit  5.8 cm        Quick Dash - 09/09/17 0001    Open a tight or new jar  Mild difficulty    Do heavy household chores (wash walls, wash floors)  Mild difficulty    Carry a shopping bag or briefcase  Moderate difficulty     Wash your back  Unable    Use a knife to cut food  Mild difficulty    Recreational activities in which you take some force or impact through your arm, shoulder, or hand (golf, hammering, tennis)  Mild difficulty    During the past week, to what extent has your arm, shoulder or hand problem interfered with your normal social activities with family, friends, neighbors, or groups?  Not at all    During the past week, to what extent has your arm, shoulder or hand problem limited your work or other regular daily activities  Not at all    Arm, shoulder, or hand pain.  Mild    Tingling (pins and needles) in your arm, shoulder, or hand  Mild    Difficulty Sleeping  Mild difficulty    DASH Score  29.55 %            [facility] Adult PT Treatment/Exercise - 09/09/17 0001      Exercises   Exercises  Shoulder      Shoulder Exercises: Pulleys   Flexion  2 minutes    Flexion Limitations  With PT tactile  cues for proper technique    ABduction  2 minutes    ABduction Limitations  With PT tactile cues for proper technique             PT Education - 09/09/17 1442    Education provided  Yes    Education Details  Shoulder abduction stretches to reduce lateral trunk tightness. Also encouraged her to attend Prehab exercise class during radiation.    Person(s) Educated  Patient    Methods  Explanation;Demonstration;Handout    Comprehension  Returned demonstration;Verbalized understanding          PT Long Term Goals - 09/09/17 1447      PT LONG TERM GOAL #1   Title  Patient will demonstrate she has returned to baseline with her left shoulder ROM and HEP    Time  8    Period  Weeks    Status  Achieved      Breast Clinic Goals - 07/17/17 1515      Patient will be able to verbalize understanding of pertinent lymphedema risk reduction practices relevant to her diagnosis specifically related to skin care.   Time  1    Period  Days    Status  Achieved      Patient will be able to return  demonstrate and/or verbalize understanding of the post-op home exercise program related to regaining shoulder range of motion.   Time  1    Period  Days    Status  Achieved      Patient will be able to verbalize understanding of the importance of attending the postoperative After Breast Cancer Class for further lymphedema risk reduction education and therapeutic exercise.   Time  1    Period  Days    Status  Achieved           Plan - 09/09/17 1444    Clinical Impression Statement  Patient is doing very well s.p left lumpectomy and SLNB (2 negative nodes) for DCIS breast cancer. She has regained full shoulder ROM and function but reported left lateral trunk tightness with left shoulder abduction. She reported this was significantly decreased after doing exercises with PT today. She begins radiation tomorrow and was encouraged to attend the free Prehab exercise class during radiation to reduce fatigue and better tolerate treatments. No other need for skilled PT at this time.    PT Treatment/Interventions  ADLs/Self Care Home Management;Therapeutic exercise;Patient/family education    PT Next Visit Plan  D/C    PT Home Exercise Plan  Post op shoulder ROM HEP and abduction stretches    Consulted and Agree with Plan of Care  Patient       Patient will benefit from skilled therapeutic intervention in order to improve the following deficits and impairments:     Visit Diagnosis: Ductal carcinoma in situ (DCIS) of left breast  Abnormal posture  Stiffness of left shoulder, not elsewhere classified  Aftercare following surgery for neoplasm     Problem List Patient Active Problem List   Diagnosis Date Noted  . Ductal carcinoma in situ (DCIS) of left breast 07/10/2017  . Hx of adenomatous polyp of colon 07/26/2016  . Abnormal nuclear stress test 12/18/2013  . Right renal artery stenosis s/p stent 11/23/2013  . Bradycardia 02/13/2012  . PAF (paroxysmal atrial fibrillation) ([place])  02/13/2012  . PVD (peripheral vascular disease) ([place]) 02/13/2012  . CAD, CABG X 3 2004, distal LAD/OM disease 2005, Myoview low risk 7/12 02/13/2012  . Diabetes mellitus,  Type 2 NIDDM 02/13/2012  . HTN (hypertension) 02/13/2012  . Dyslipidemia 02/13/2012  . Chronic anticoagulation 02/13/2012  . Carotid stenosis 01/28/2012    PHYSICAL THERAPY DISCHARGE SUMMARY  Visits from Start of Care: 2  Current functional level related to goals / functional outcomes: Goals met   Remaining deficits: Some mild lateral trunk tightness which may be due to scar tissue or just tightness following surgery.   Education / Equipment: HEP Plan: Patient agrees to discharge.  Patient goals were met. Patient is being discharged due to meeting the stated rehab goals.  ?????         Annia Friendly, [place] 09/09/17 2:51 PM   [place], [place], [address] Phone: [phone]   Fax:  [phone]  Name: [name] MRN: [id number] Date of Birth: April 06, 1951

## 2017-09-10 ENCOUNTER — Ambulatory Visit
Admission: RE | Admit: 2017-09-10 | Discharge: 2017-09-10 | Disposition: A | Discharge status: Home / Self Care | Payer: Medicare Other | Source: Ambulatory Visit | Attending: Radiation Oncology | Admitting: Radiation Oncology

## 2017-09-10 DIAGNOSIS — D0512 Intraductal carcinoma in situ of left breast: Secondary | ICD-10-CM | POA: Insufficient documentation

## 2017-09-10 DIAGNOSIS — Z170 Estrogen receptor positive status [ER+]: Secondary | ICD-10-CM | POA: Diagnosis not present

## 2017-09-11 NOTE — Progress Notes (Signed)
  Radiation Oncology         [phone] ________________________________  Name: [name] MRN: [id number]  Date: 09/10/2017  DOB: 29-Jan-1951  SIMULATION AND TREATMENT PLANNING NOTE    ICD-10-CM   1. Ductal carcinoma in situ (DCIS) of left breast D05.12     DIAGNOSIS:  Stage 0 (cTis (DCIS), cN0, cM0, G3, ER: Negative, PR: Negative, high-grade, left breast    NARRATIVE:  The patient was brought to the [facility].  Identity was confirmed.  All relevant records and images related to the planned course of therapy were reviewed.  The patient freely provided informed written consent to proceed with treatment after reviewing the details related to the planned course of therapy. The consent form was witnessed and verified by the simulation staff.  Then, the patient was set-up in a stable reproducible  supine position for radiation therapy.  CT images were obtained.  Surface markings were placed.  The CT images were loaded into the planning software.  Then the target and avoidance structures were contoured.  Treatment planning then occurred.  The radiation prescription was entered and confirmed.  Then, I designed and supervised the construction of a total of 3 medically necessary complex treatment devices.  I have requested : 3D Simulation  I have requested a DVH of the following structures: Heart, lungs, lumpectomy cavity.  I have ordered:CBC  PLAN:  The patient will receive 40.05 Gy in 15 fractions followed by a boost to the lumpectomy cavity of 10 gray in 5 fractions. Hypo-fractionated accelerated radiation therapy.    Optical Surface Tracking Plan:  Since intensity modulated radiotherapy (IMRT) and 3D conformal radiation treatment methods are predicated on accurate and precise positioning for treatment, intrafraction motion monitoring is medically necessary to ensure accurate and safe treatment delivery.  The ability to quantify intrafraction motion without excessive  ionizing radiation dose can only be performed with optical surface tracking. Accordingly, surface imaging offers the opportunity to obtain 3D measurements of patient position throughout IMRT and 3D treatments without excessive radiation exposure.  I am ordering optical surface tracking for this patient's upcoming course of radiotherapy. ________________________________   Special treatment procedure was performed today due to the extra time and effort required by myself to plan and prepare this patient for deep inspiration breath hold technique.  I have determined cardiac sparing to be of benefit to this patient to prevent long term cardiac damage due to radiation of the heart.  Bellows were placed on the patient's abdomen. To facilitate cardiac sparing, the patient was coached by the radiation therapists on breath hold techniques and breathing practice was performed. Practice waveforms were obtained. The patient was then scanned while maintaining breath hold in the treatment position.  This image was then transferred over to the imaging specialist. The imaging specialist then created a fusion of the free breathing and breath hold scans using the chest wall as the stable structure. I personally reviewed the fusion in axial, coronal and sagittal image planes.  Excellent cardiac sparing was obtained.  I felt the patient is an appropriate candidate for breath hold and the patient will be treated as such.  The image fusion was then reviewed with the patient to reinforce the necessity of reproducible breath hold.    -----------------------------------  Blair Promise, PhD, MD

## 2017-09-16 DIAGNOSIS — D0512 Intraductal carcinoma in situ of left breast: Secondary | ICD-10-CM | POA: Diagnosis not present

## 2017-09-17 ENCOUNTER — Ambulatory Visit
Admission: RE | Admit: 2017-09-17 | Discharge: 2017-09-17 | Disposition: A | Discharge status: Home / Self Care | Payer: Medicare Other | Source: Ambulatory Visit | Attending: Radiation Oncology | Admitting: Radiation Oncology

## 2017-09-17 ENCOUNTER — Telehealth: Payer: Self-pay | Admitting: Hematology

## 2017-09-17 DIAGNOSIS — D0512 Intraductal carcinoma in situ of left breast: Secondary | ICD-10-CM | POA: Diagnosis not present

## 2017-09-17 NOTE — Progress Notes (Signed)
  Radiation Oncology         [phone] ________________________________  Name: [name] MRN: [id number]  Date: 09/17/2017  DOB: [birth year]  Simulation Verification Note    ICD-10-CM   1. Ductal carcinoma in situ (DCIS) of left breast D05.12     Status: outpatient  NARRATIVE: The patient was brought to the treatment unit and placed in the planned treatment position. The clinical setup was verified. Then port films were obtained and uploaded to the radiation oncology medical record software.  The treatment beams were carefully compared against the planned radiation fields. The position location and shape of the radiation fields was reviewed. They targeted volume of tissue appears to be appropriately covered by the radiation beams. Organs at risk appear to be excluded as planned.  Based on my personal review, I approved the simulation verification. The patient's treatment will proceed as planned.  -----------------------------------  Blair Promise, PhD, MD

## 2017-09-17 NOTE — Telephone Encounter (Signed)
tried to call patient regarding voicemail

## 2017-09-18 ENCOUNTER — Ambulatory Visit
Admission: RE | Admit: 2017-09-18 | Discharge: 2017-09-18 | Disposition: A | Discharge status: Home / Self Care | Payer: Medicare Other | Source: Ambulatory Visit | Attending: Radiation Oncology | Admitting: Radiation Oncology

## 2017-09-18 DIAGNOSIS — D0512 Intraductal carcinoma in situ of left breast: Secondary | ICD-10-CM | POA: Diagnosis not present

## 2017-09-19 ENCOUNTER — Ambulatory Visit
Admission: RE | Admit: 2017-09-19 | Discharge: 2017-09-19 | Disposition: A | Discharge status: Home / Self Care | Payer: Medicare Other | Source: Ambulatory Visit | Attending: Radiation Oncology | Admitting: Radiation Oncology

## 2017-09-19 DIAGNOSIS — D0512 Intraductal carcinoma in situ of left breast: Secondary | ICD-10-CM | POA: Diagnosis not present

## 2017-09-20 ENCOUNTER — Ambulatory Visit
Admission: RE | Admit: 2017-09-20 | Discharge: 2017-09-20 | Disposition: A | Discharge status: Home / Self Care | Payer: Medicare Other | Source: Ambulatory Visit | Attending: Radiation Oncology | Admitting: Radiation Oncology

## 2017-09-20 DIAGNOSIS — D0512 Intraductal carcinoma in situ of left breast: Secondary | ICD-10-CM | POA: Diagnosis not present

## 2017-09-23 ENCOUNTER — Ambulatory Visit
Admission: RE | Admit: 2017-09-23 | Discharge: 2017-09-23 | Disposition: A | Discharge status: Home / Self Care | Payer: Medicare Other | Source: Ambulatory Visit | Attending: Radiation Oncology | Admitting: Radiation Oncology

## 2017-09-23 DIAGNOSIS — D0512 Intraductal carcinoma in situ of left breast: Secondary | ICD-10-CM | POA: Diagnosis not present

## 2017-09-24 ENCOUNTER — Ambulatory Visit
Admission: RE | Admit: 2017-09-24 | Discharge: 2017-09-24 | Disposition: A | Discharge status: Home / Self Care | Payer: Medicare Other | Source: Ambulatory Visit | Attending: Radiation Oncology | Admitting: Radiation Oncology

## 2017-09-24 ENCOUNTER — Other Ambulatory Visit: Payer: Self-pay | Admitting: Radiation Oncology

## 2017-09-24 DIAGNOSIS — D0512 Intraductal carcinoma in situ of left breast: Secondary | ICD-10-CM

## 2017-09-24 MED ORDER — ALRA NON-METALLIC DEODORANT (RAD-ONC)
1.0000 "application " | Freq: Once | TOPICAL | Status: AC
  Administered 2017-09-24: 1 via TOPICAL

## 2017-09-24 MED ORDER — PROCHLORPERAZINE MALEATE 10 MG PO TABS: 10.0000 mg | ORAL_TABLET | Freq: Four times a day (QID) | ORAL | 0 refills | Status: DC | PRN

## 2017-09-24 MED ORDER — RADIAPLEXRX EX GEL
Freq: Once | CUTANEOUS | Status: AC
  Administered 2017-09-24: 12:00:00 via TOPICAL

## 2017-09-24 NOTE — Progress Notes (Signed)

## 2017-09-25 ENCOUNTER — Ambulatory Visit
Admission: RE | Admit: 2017-09-25 | Discharge: 2017-09-25 | Disposition: A | Discharge status: Home / Self Care | Payer: Medicare Other | Source: Ambulatory Visit | Attending: Radiation Oncology | Admitting: Radiation Oncology

## 2017-09-25 DIAGNOSIS — D0512 Intraductal carcinoma in situ of left breast: Secondary | ICD-10-CM | POA: Diagnosis not present

## 2017-09-26 ENCOUNTER — Ambulatory Visit
Admission: RE | Admit: 2017-09-26 | Discharge: 2017-09-26 | Disposition: A | Discharge status: Home / Self Care | Payer: Medicare Other | Source: Ambulatory Visit | Attending: Radiation Oncology | Admitting: Radiation Oncology

## 2017-09-26 DIAGNOSIS — D0512 Intraductal carcinoma in situ of left breast: Secondary | ICD-10-CM | POA: Diagnosis not present

## 2017-09-27 ENCOUNTER — Ambulatory Visit
Admission: RE | Admit: 2017-09-27 | Discharge: 2017-09-27 | Disposition: A | Discharge status: Home / Self Care | Payer: Medicare Other | Source: Ambulatory Visit | Attending: Radiation Oncology | Admitting: Radiation Oncology

## 2017-09-27 DIAGNOSIS — D0512 Intraductal carcinoma in situ of left breast: Secondary | ICD-10-CM | POA: Diagnosis not present

## 2017-09-30 ENCOUNTER — Ambulatory Visit
Admission: RE | Admit: 2017-09-30 | Discharge: 2017-09-30 | Disposition: A | Discharge status: Home / Self Care | Payer: Medicare Other | Source: Ambulatory Visit | Attending: Radiation Oncology | Admitting: Radiation Oncology

## 2017-09-30 DIAGNOSIS — D0512 Intraductal carcinoma in situ of left breast: Secondary | ICD-10-CM | POA: Diagnosis present

## 2017-09-30 DIAGNOSIS — Z171 Estrogen receptor negative status [ER-]: Secondary | ICD-10-CM | POA: Diagnosis not present

## 2017-09-30 DIAGNOSIS — Z510 Encounter for antineoplastic radiation therapy: Secondary | ICD-10-CM | POA: Insufficient documentation

## 2017-10-01 ENCOUNTER — Ambulatory Visit
Admission: RE | Admit: 2017-10-01 | Discharge: 2017-10-01 | Disposition: A | Discharge status: Home / Self Care | Payer: Medicare Other | Source: Ambulatory Visit | Attending: Radiation Oncology | Admitting: Radiation Oncology

## 2017-10-01 DIAGNOSIS — D0512 Intraductal carcinoma in situ of left breast: Secondary | ICD-10-CM | POA: Diagnosis not present

## 2017-10-02 ENCOUNTER — Ambulatory Visit
Admission: RE | Admit: 2017-10-02 | Discharge: 2017-10-02 | Disposition: A | Discharge status: Home / Self Care | Payer: Medicare Other | Source: Ambulatory Visit | Attending: Radiation Oncology | Admitting: Radiation Oncology

## 2017-10-02 DIAGNOSIS — D0512 Intraductal carcinoma in situ of left breast: Secondary | ICD-10-CM | POA: Diagnosis not present

## 2017-10-03 ENCOUNTER — Ambulatory Visit
Admission: RE | Admit: 2017-10-03 | Discharge: 2017-10-03 | Disposition: A | Discharge status: Home / Self Care | Payer: Medicare Other | Source: Ambulatory Visit | Attending: Radiation Oncology | Admitting: Radiation Oncology

## 2017-10-03 DIAGNOSIS — D0512 Intraductal carcinoma in situ of left breast: Secondary | ICD-10-CM | POA: Diagnosis not present

## 2017-10-04 ENCOUNTER — Other Ambulatory Visit: Payer: Self-pay | Admitting: Nephrology

## 2017-10-04 ENCOUNTER — Ambulatory Visit
Admission: RE | Admit: 2017-10-04 | Discharge: 2017-10-04 | Disposition: A | Discharge status: Home / Self Care | Payer: Medicare Other | Source: Ambulatory Visit | Attending: Radiation Oncology | Admitting: Radiation Oncology

## 2017-10-04 DIAGNOSIS — N183 Chronic kidney disease, stage 3 unspecified: Secondary | ICD-10-CM

## 2017-10-04 DIAGNOSIS — D0512 Intraductal carcinoma in situ of left breast: Secondary | ICD-10-CM | POA: Diagnosis not present

## 2017-10-07 ENCOUNTER — Ambulatory Visit
Admission: RE | Admit: 2017-10-07 | Discharge: 2017-10-07 | Disposition: A | Discharge status: Home / Self Care | Payer: Medicare Other | Source: Ambulatory Visit | Attending: Radiation Oncology | Admitting: Radiation Oncology

## 2017-10-07 ENCOUNTER — Ambulatory Visit: Payer: Medicare Other | Admitting: Cardiovascular Disease

## 2017-10-07 DIAGNOSIS — D0512 Intraductal carcinoma in situ of left breast: Secondary | ICD-10-CM | POA: Diagnosis not present

## 2017-10-08 ENCOUNTER — Ambulatory Visit: Payer: Medicare Other | Admitting: Radiation Oncology

## 2017-10-08 ENCOUNTER — Ambulatory Visit
Admission: RE | Admit: 2017-10-08 | Discharge: 2017-10-08 | Disposition: A | Discharge status: Home / Self Care | Payer: Medicare Other | Source: Ambulatory Visit | Attending: Radiation Oncology | Admitting: Radiation Oncology

## 2017-10-08 DIAGNOSIS — D0512 Intraductal carcinoma in situ of left breast: Secondary | ICD-10-CM | POA: Diagnosis not present

## 2017-10-09 ENCOUNTER — Ambulatory Visit
Admission: RE | Admit: 2017-10-09 | Discharge: 2017-10-09 | Disposition: A | Discharge status: Home / Self Care | Payer: Medicare Other | Source: Ambulatory Visit | Attending: Radiation Oncology | Admitting: Radiation Oncology

## 2017-10-09 DIAGNOSIS — D0512 Intraductal carcinoma in situ of left breast: Secondary | ICD-10-CM | POA: Diagnosis not present

## 2017-10-10 ENCOUNTER — Ambulatory Visit: Payer: Medicare Other | Admitting: Cardiovascular Disease

## 2017-10-10 ENCOUNTER — Ambulatory Visit
Admission: RE | Admit: 2017-10-10 | Discharge: 2017-10-10 | Disposition: A | Discharge status: Home / Self Care | Payer: Medicare Other | Source: Ambulatory Visit | Attending: Radiation Oncology | Admitting: Radiation Oncology

## 2017-10-10 DIAGNOSIS — D0512 Intraductal carcinoma in situ of left breast: Secondary | ICD-10-CM | POA: Diagnosis not present

## 2017-10-11 ENCOUNTER — Ambulatory Visit
Admission: RE | Admit: 2017-10-11 | Discharge: 2017-10-11 | Disposition: A | Discharge status: Home / Self Care | Payer: Medicare Other | Source: Ambulatory Visit | Attending: Radiation Oncology | Admitting: Radiation Oncology

## 2017-10-11 DIAGNOSIS — D0512 Intraductal carcinoma in situ of left breast: Secondary | ICD-10-CM | POA: Diagnosis not present

## 2017-10-14 ENCOUNTER — Ambulatory Visit
Admission: RE | Admit: 2017-10-14 | Discharge: 2017-10-14 | Disposition: A | Discharge status: Home / Self Care | Payer: Medicare Other | Source: Ambulatory Visit | Attending: Radiation Oncology | Admitting: Radiation Oncology

## 2017-10-14 DIAGNOSIS — D0512 Intraductal carcinoma in situ of left breast: Secondary | ICD-10-CM | POA: Diagnosis not present

## 2017-10-15 ENCOUNTER — Ambulatory Visit
Admission: RE | Admit: 2017-10-15 | Discharge: 2017-10-15 | Disposition: A | Discharge status: Home / Self Care | Payer: Medicare Other | Source: Ambulatory Visit | Attending: Radiation Oncology | Admitting: Radiation Oncology

## 2017-10-15 ENCOUNTER — Encounter: Payer: Self-pay | Admitting: Radiation Oncology

## 2017-10-15 DIAGNOSIS — D0512 Intraductal carcinoma in situ of left breast: Secondary | ICD-10-CM | POA: Diagnosis not present

## 2017-10-15 NOTE — Progress Notes (Signed)
  Radiation Oncology         [phone] ________________________________  Name: [name] MRN: [id number]  Date: 10/15/2017  DOB: 1951/03/06  End of Treatment Note  Diagnosis:   Stage 0 (cTis (DCIS), cN0, cM0, G3, ER: Negative, PR: Negative, HER2: Unknown)high-grade, left breast   Indication for treatment:  Curative       Radiation treatment dates:   09/18/2017-10/15/2017  Site/dose:   1. Left breast, 2.67 Gy in 15 fractions for a total dose of 40.05 Gy           2. Boost, 2 Gy in 5 fractions for a total dose of 10 Gy   Beams/energy:   1. 3D, 6X        2. 3D, 6X  Narrative: The patient tolerated radiation treatment relatively well. Pt developed radiation related skin changes, including hyperpigmentation without skin breakdown. Pt was noted to be using radiaplex BID.   Plan: The patient has completed radiation treatment. The patient will return to radiation oncology clinic for routine followup in one month. I advised them to call or return sooner if they have any questions or concerns related to their recovery or treatment.  -----------------------------------  Blair Promise, PhD, MD  This document serves as a record of services personally performed by Gery Pray, MD. It was created on his behalf by [facility], a trained medical scribe. The creation of this record is based on the scribe's personal observations and the provider's statements to them. This document has been checked and approved by the attending provider.

## 2017-10-17 ENCOUNTER — Inpatient Hospital Stay: Payer: Medicare Other | Attending: Hematology | Admitting: Hematology

## 2017-10-18 ENCOUNTER — Telehealth: Payer: Self-pay | Admitting: Hematology

## 2017-10-18 NOTE — Telephone Encounter (Signed)
Spoke with patient about r/s a missed appt from 4/18 - per 4/18 sch message - patient says she is unaware of the appts and has never seen Dr. Burr Medico and doesn't want to schedule an appt with Dr. Burr Medico.

## 2017-10-21 ENCOUNTER — Other Ambulatory Visit: Payer: Medicare Other

## 2017-10-29 ENCOUNTER — Ambulatory Visit: Payer: Medicare Other | Admitting: Cardiovascular Disease

## 2017-10-29 ENCOUNTER — Encounter: Payer: Self-pay | Admitting: Cardiovascular Disease

## 2017-10-29 VITALS — BP 124/64 | HR 61 | Ht 65.0 in | Wt 164.2 lb

## 2017-10-29 DIAGNOSIS — I6523 Occlusion and stenosis of bilateral carotid arteries: Secondary | ICD-10-CM

## 2017-10-29 DIAGNOSIS — I701 Atherosclerosis of renal artery: Secondary | ICD-10-CM

## 2017-10-29 DIAGNOSIS — I739 Peripheral vascular disease, unspecified: Secondary | ICD-10-CM | POA: Diagnosis not present

## 2017-10-29 DIAGNOSIS — I25718 Atherosclerosis of autologous vein coronary artery bypass graft(s) with other forms of angina pectoris: Secondary | ICD-10-CM

## 2017-10-29 DIAGNOSIS — E785 Hyperlipidemia, unspecified: Secondary | ICD-10-CM

## 2017-10-29 DIAGNOSIS — I480 Paroxysmal atrial fibrillation: Secondary | ICD-10-CM | POA: Diagnosis not present

## 2017-10-29 DIAGNOSIS — E049 Nontoxic goiter, unspecified: Secondary | ICD-10-CM

## 2017-10-29 DIAGNOSIS — E1151 Type 2 diabetes mellitus with diabetic peripheral angiopathy without gangrene: Secondary | ICD-10-CM | POA: Diagnosis not present

## 2017-10-29 DIAGNOSIS — Z7901 Long term (current) use of anticoagulants: Secondary | ICD-10-CM | POA: Diagnosis not present

## 2017-10-29 NOTE — Progress Notes (Signed)
Cardiology Office Note    Date:  10/30/2017   ID:  [name], DOB December 03, 1950, MRN [id number]  PCP:  Prince Solian, MD  Cardiologist:   Sanda Klein, MD   Chief Complaint  Patient presents with  . Follow-up    pt denied chest pain     History of Present Illness:  [name] is a 67 y.o. female with paroxysmal atrial fibrillation, extensive coronary and peripheral arterial complications of atherosclerosis, with onset at early age, type 2 DM.   Had a challenging year.  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  She just completed radiation therapy about 2 weeks ago.  There has also been substantial progression of her chronic kidney disease, with her creatinine now at her baseline around 2.0.  She is seeing Dr. Lorrene Reid.  At her most recent appointment in January when she saw Rosaria Ferries she was in atrial fibrillation with controlled ventricular rate.  Today again she has an irregular rhythm but her rate is well controlled.  She appears to be oblivious to the arrhythmia..   The patient specifically denies any chest pain at rest exertion, dyspnea at rest or with exertion, orthopnea, paroxysmal nocturnal dyspnea, syncope, palpitations, focal neurological deficits, intermittent claudication, lower extremity edema, unexplained weight gain, cough, hemoptysis or wheezing.  Labs were recently checked by Dr. Danne Baxter and her glycemic control remains good with an A1c of 7%.  Her LDL was 43 last September and all the other components of her lipid panel were within desirable range.  On metformin monotherapy for her diabetes.. She is on maximum dose of the most potent available statin. Her blood pressure is well controlled.   A carotid Doppler scan also performed in January 2019 showed  moderate bilateral disease, though there has been some slight increase in the velocity across the right  internal carotid lesion. Her last renal artery duplex was performed in October 2017 and showed patent renal arteries.  She is scheduled for  renal duplex in May.  Last lower extremity arterial Doppler was performed in April 2016 and showed bilateral moderate stenosis in the superficial femoral arteries with three-vessel runoff, ABI 0.69 on right, 0.7 on left.  She had CABG X 3 with LIMA-LAD, SVG-OM,SVG-RCA 2004. Re studied in 2005 and this showed patent grafts, with distal LAD and OM disease. Her Myoview in 7/12 was low risk, but repeat in 11/2013 showed new anterolateral ischemia. She has no good revascularization options in the anterior wall. Although the LIMA anastomosis is patent there is no retrograde flow to the proximal LAD and the distal LAD is diffusely diseased and not amenable to PCI. Her most recent echocardiogram in January 2014 showed normal left ventricular systolic function and regional wall motion. Echocardiogram in January 2014 showed normal left ventricular systolic function and wall motion with an estimated systolic PA pressure of 44 mm Hg  In March of 2013 she had DCCV. She was in normal sinus rhythm in August 2015 in the clinic.  In September 2016, December 2016 and January 2018 she presented with rate controlled asymptomatic atrial fibrillation. She has PAD and has had prior renal and popliteal stenting.  Last lower extremity arterial Doppler was performed in April 2016 and showed bilateral moderate stenosis in the superficial femoral arteries with three-vessel runoff, ABI 0.69 on right, 0.7 on left. She had left carotid stenting 02/12/12 and post op she was hypotensive and  bradycardic.  A carotid Doppler scan also performed in January 2019 showed  moderate bilateral disease. She has hyperlipidemia, diabetes mellitus and hypertension.    Past Medical History:  Diagnosis Date  . Acute kidney failure ([place])   . ASHD (arteriosclerotic heart disease)    native vessel  . Atrial  fibrillation ([place])   . Breast cancer, left ([place])   . CAD (coronary artery disease) 2004   multivessel, status post CABG x3  . Carotid artery disease ([place])    stent in 2013  . CKD (chronic kidney disease), stage III ([place])   . Diabetes ([place])   . GERD (gastroesophageal reflux disease)   . History of tobacco abuse   . Hx of adenomatous polyp of colon 07/26/2016  . Hyperlipidemia   . Hypertension   . Hyperthyroidism   . Myocardial infarction ([place]) 2002  . Peripheral vascular disease ([facility])    S/P popliteal artery and renal artery stents  . Renovascular hypertension   . S/P CABG x 3 2004  . Thyroid goiter   . Tobacco abuse   . Vaginal cancer ([place]) 1983    Past Surgical History:  Procedure Laterality Date  . BREAST LUMPECTOMY WITH RADIOACTIVE SEED AND SENTINEL LYMPH NODE BIOPSY Left 08/08/2017   Procedure: LEFT BREAST LUMPECTOMY WITH RADIOACTIVE SEED AND SENTINEL LYMPH NODE BIOPSY;  Surgeon: Jovita Kussmaul, MD;  Location: [facility];  Service: General;  Laterality: Left;  . CARDIAC CATHETERIZATION  09/23/2002   3 vessel CAD >> CABG (Dr. Marella Chimes)  . CARDIAC CATHETERIZATION  12/23/2003   patent LIMA, LAD, SVG to OM1, SVG to RCA, distal disease beyond graft insertion (Dr. Marella Chimes)  . CARDIOVERSION  09/11/2011   Procedure: CARDIOVERSION;  Surgeon: Sanda Klein, MD;  Location: [facility];  Service: Cardiovascular;  Laterality: N/A;  . CAROTID ANGIOGRAM Bilateral 02/12/2012   Procedure: CAROTID ANGIOGRAM;  Surgeon: Serafina Mitchell, MD;  Location: [facility] CATH LAB;  Service: Cardiovascular;  Laterality: Bilateral;  . CAROTID DOPPLER  03/2013   right bulb & prox ICA w/50-69% stenosis, L ICA stent open/patent  . CAROTID STENT  02/12/2012   Carotid Stent Insertion, Carotid Angiogram - 9x40 Cordis Precise stent  (Dr. Pierre Bali)  . CAROTID STENT INSERTION N/A 02/12/2012   Procedure: CAROTID STENT INSERTION;  Surgeon: Serafina Mitchell, MD;  [facility] C[facility];  Service: Cardiovascular;   Laterality: N/A;  . CORONARY ARTERY BYPASS GRAFT  09/24/2002   LIMA to LAD, SVG to Cfx, SVG to RCA (Dr. Prescott Gum)  . LOWER EXTREMITY ARTERIAL DOPPLER  09/2012   bilateral ABIS (mod arterial insuff at rest); R SFA 70-99% stenosis, L SFA 50-69% stenosis  . NM MYOCAR PERF WALL MOTION  12/2010   persnatine - moderate perfusion defect in apical anterior/apical/basal inferolateral/mid inferolateral/apical lateral walls - EF 65% - low risk  . RENAL ARTERY ANGIOPLASTY  02/11/2003   transluminal angioplasty & stent wtih 4x72mm Genesis balloon expandable stent premounted on Cordis aviatory balloon (Dr. Charlyne Petrin)  . RENAL DOPPLER  09/2012   R prox renal artery @ stent equal/less that 60% diameter reduction; L renal artery 1-59% stenosis  . TRANSTHORACIC ECHOCARDIOGRAM  07/2012   EF 55-65%, mild conc LVH, grade 1 diastolic dysfunction; calcicifed MV annulus, mild MR; LA mild-mod dilated; RA mod dilated; mod TR; PA peak pressure 46mmHg, RVSP increased (pulm htn)    Current Medications: Outpatient Medications Prior to Visit  Medication Sig Dispense Refill  . amLODipine (NORVASC) 10 MG tablet TAKE 1  TABLET BY MOUTH  DAILY 90 tablet 2  . apixaban (ELIQUIS) 5 MG TABS tablet TAKE 1 TABLET(5 MG) BY MOUTH TWICE DAILY 60 tablet 1  . ketorolac (ACULAR) 0.5 % ophthalmic solution Place 2 drops into the left eye 2 (two) times daily.  6  . lansoprazole (PREVACID) 15 MG capsule Take 1 capsule (15 mg total) by mouth daily. 90 capsule 3  . metFORMIN (GLUCOPHAGE) 1000 MG tablet Take 1 tablet (1,000 mg total) by mouth 2 (two) times daily with a meal. 180 tablet 3  . methimazole (TAPAZOLE) 10 MG tablet Take 10 mg by mouth 2 (two) times daily.  5  . metoprolol tartrate (LOPRESSOR) 50 MG tablet TAKE 1 TABLET BY MOUTH  EVERY DAY 90 tablet 2  . nitroGLYCERIN (NITROSTAT) 0.4 MG SL tablet Place 1 tablet (0.4 mg total) under the tongue every 5 (five) minutes as needed for chest pain. 25 tablet 6  . prochlorperazine (COMPAZINE) 10  MG tablet Take 1 tablet (10 mg total) by mouth every 6 (six) hours as needed for nausea or vomiting. 30 tablet 0  . rosuvastatin (CRESTOR) 40 MG tablet TAKE 1 TABLET BY MOUTH  DAILY 90 tablet 2  . triamterene-hydrochlorothiazide (MAXZIDE-25) 37.5-25 MG tablet TAKE 1 TABLET BY MOUTH  DAILY 90 tablet 2  . valsartan (DIOVAN) 160 MG tablet TAKE 1 TABLET BY MOUTH TWO  TIMES DAILY 180 tablet 2  . HYDROcodone-acetaminophen (NORCO/VICODIN) 5-325 MG tablet Take 1-2 tablets by mouth every 6 (six) hours as needed for moderate pain or severe pain. (Patient not taking: Reported on 08/26/2017) 15 tablet 0   No facility-administered medications prior to visit.      Allergies:   Patient has no known allergies.   Social History   Socioeconomic History  . Marital status: Married    Spouse name: Not on file  . Number of children: 2  . Years of education: 64  . Highest education level: Not on file  Occupational History  . Occupation: retired  Scientific laboratory technician  . Financial resource strain: Not on file  . Food insecurity:    Worry: Not on file    Inability: Not on file  . Transportation needs:    Medical: Not on file    Non-medical: Not on file  Tobacco Use  . Smoking status: Former Smoker    Packs/day: 1.00    Years: 30.00    Pack years: 30.00    Last attempt to quit: 07/02/2001    Years since quitting: 16.3  . Smokeless tobacco: Never Used  Substance and Sexual Activity  . Alcohol use: Yes    Alcohol/week: 1.2 - 1.8 oz    Types: 2 - 3 Standard drinks or equivalent per week    Frequency: Never    Comment: socially  . Drug use: No  . Sexual activity: Not on file  Lifestyle  . Physical activity:    Days per week: Not on file    Minutes per session: Not on file  . Stress: Not on file  Relationships  . Social connections:    Talks on phone: Not on file    Gets together: Not on file    Attends religious service: Not on file    Active member of club or organization: Not on file    Attends  meetings of clubs or organizations: Not on file    Relationship status: Not on file  Other Topics Concern  . Not on file  Social History Narrative   Pt lives  with her husband and son.      Family History:  The patient's family history includes Deep vein thrombosis in her sister; Diabetes in her brother and sister; Heart disease in her sister; Hyperlipidemia in her sister and son; Hypertension in her mother, sister, and son; Other in her brother and mother.   ROS:   Please see the history of present illness.    ROS All other systems reviewed and are negative.   PHYSICAL EXAM:   VS:  BP 124/64   Pulse 61   Ht 5\' 5"  (1.651 m)   Wt 164 lb 3.2 oz (74.5 kg)   SpO2 99%   BMI 27.32 kg/m     General: Alert, oriented x3, no distress, appears well Head: no evidence of trauma, PERRL, EOMI, no exophtalmos or lid lag, no myxedema, no xanthelasma; normal ears, nose and oropharynx Neck: normal jugular venous pulsations and no hepatojugular reflux; brisk carotid pulses without delay and very faint bilateral carotid bruits.  Prominent goiter Chest: clear to auscultation, no signs of consolidation by percussion or palpation, normal fremitus, symmetrical and full respiratory excursions.  There is some hyperpigmentation of the skin in the left lateral chest probably a consequence of her recent XRT. Cardiovascular: normal position and quality of the apical impulse, irregular rhythm, normal first and second heart sounds, no murmurs, rubs or gallops Abdomen: no tenderness or distention, no masses by palpation, no abnormal pulsatility or arterial bruits, normal bowel sounds, no hepatosplenomegaly Extremities: no clubbing, cyanosis or edema; 2+ radial, ulnar and brachial pulses bilaterally; 2+ right femoral, posterior tibial and dorsalis pedis pulses; 2+ left femoral, posterior tibial and dorsalis pedis pulses; no subclavian or femoral bruits Neurological: grossly nonfocal Psych: Normal mood and affect   Wt  Readings from Last 3 Encounters:  10/29/17 164 lb 3.2 oz (74.5 kg)  08/26/17 162 lb 12.8 oz (73.8 kg)  08/08/17 162 lb 2 oz (73.5 kg)      Studies/Labs Reviewed:   EKG:  EKG is ordered today.  The ekg ordered today demonstrates Atrial fibrillation, rSR' pattern in V1 consistent with incomplete right bundle branch block, QRS 100 ms, nonspecific ST-T wave changes and QTc 445 ms  Recent Labs: 07/17/2017: ALT 15; Platelet Count 245 08/08/2017: BUN 31; Creatinine, Ser 2.00; Hemoglobin 11.2; Potassium 3.6; Sodium 140   Creatinine was 1.06 in September 2016, 1.19 in October 2017, 2.08 in January 2019, 2.11 in March 2019.  Lipid Panel    Component Value Date/Time   CHOL 101 11/30/2013 1014   TRIG 70 11/30/2013 1014   HDL 43 11/30/2013 1014   CHOLHDL 2.3 11/30/2013 1014   VLDL 14 11/30/2013 1014   LDLCALC 44 11/30/2013 1014   March 27, 2017 Total cholesterol 92, HDL 37, LDL 43, triglycerides 62  Additional studies/ records that were reviewed today include:  Recent vascular studies, notes from nephrology clinic  ASSESSMENT:    1. Right renal artery stenosis s/p stent   2. PAF (paroxysmal atrial fibrillation) ([place])   3. Coronary artery disease of autologous vein bypass graft with stable angina pectoris ([place])   4. Bilateral carotid artery stenosis   5. PVD (peripheral vascular disease) ([place])   6. Dyslipidemia   7. Type 2 diabetes mellitus with diabetic peripheral angiopathy without gangrene, without long-term current use of insulin ([place])   8. Long term (current) use of anticoagulants   9. Goiter      PLAN:  In order of problems listed above:  1. AFib:  I have personally  not seen her normal rhythm since September 2016 and its likely that the atrial fibrillation is now permanent arrhythmia.  As well rate controlled and asymptomatic.  She is appropriately anticoagulated without bleeding problems.  Antiarrhythmic therapy and cardioversion does not appear to be indicated. 2. CAD:  Asymptomatic, angina free. she is known to have anterolateral ischemia by nuclear stress testing (patent LIMA to LAD but with occlusion of the LAD without retrograde flow to proximal vessel, diffuse distal LAD disease), but is best suited for medical treatment. There are no good options for revascularization. 3. Carotid stenosis: History of left carotid stent, has relatively stable bilateral disease although there is some increase in the velocities in the right internal carotid on the most recent duplex study.  We will repeat yearly. 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 There is also 50% stenosis in the M1 segment of the left middle cerebral artery. No neurological complaints.  4. PAD: Does not have intermittent claudication. Has moderate reduction in bilateral arterial flow (R ABI 0.69, LABI 0.79).  She should continue to exercise/daily walking to help promote collateral formation. 5. S/P R renal stent: Renal function has deteriorated, although blood pressure control remains excellent.  Scheduled for repeat duplex ultrasound next month, Dr. Lorrene Reid has already ordered a conventional ultrasound which is scheduled for tomorrow.   6. HLP: On very high-dose rosuvastatin with excellent LDL at last check. We'll get the most recent results from PCP. 7. DM: Well controlled, last A1c 7% 8. Eliquis: Benign findings of colonoscopy. CHADSVasc 5 (age, gender, diabetes mellitus, hypertension, vascular disease). No serious bleeding complications. 9. Goiter: Labs consistent with euthyroid state.  No symptoms of thyrotoxicosis.   Medication Adjustments/Labs and Tests Ordered: Current medicines are reviewed at length with the patient today.  Concerns regarding medicines are outlined above.  Medication changes, Labs and Tests ordered today are listed in the Patient Instructions below. Patient Instructions  Dr  Sallyanne Kuster recommends that you continue on your current medications as directed. Please refer to the Current Medication list given to you today.  Your physician has requested that you have a renal artery duplex. During this test, an ultrasound is used to evaluate blood flow to the kidneys. Allow one hour for this exam. Do not eat after midnight the day before and avoid carbonated beverages. Take your medications as you usually do.  Dr Sallyanne Kuster recommends that you schedule a follow-up appointment in 12 months. You will receive a reminder letter in the mail two months in advance. If you don't receive a letter, please call our office to schedule the follow-up appointment.  If you need a refill on your cardiac medications before your next appointment, please call your pharmacy.    Signed, Sanda Klein, MD  10/30/2017 12:57 PM    [address], [place], [place]  [address] Phone: [phone]; Fax: [phone]

## 2017-10-29 NOTE — Patient Instructions (Signed)
Dr Sallyanne Kuster recommends that you continue on your current medications as directed. Please refer to the Current Medication list given to you today.  Your physician has requested that you have a renal artery duplex. During this test, an ultrasound is used to evaluate blood flow to the kidneys. Allow one hour for this exam. Do not eat after midnight the day before and avoid carbonated beverages. Take your medications as you usually do.  Dr Sallyanne Kuster recommends that you schedule a follow-up appointment in 12 months. You will receive a reminder letter in the mail two months in advance. If you don't receive a letter, please call our office to schedule the follow-up appointment.  If you need a refill on your cardiac medications before your next appointment, please call your pharmacy.

## 2017-10-30 ENCOUNTER — Encounter: Payer: Self-pay | Admitting: Cardiovascular Disease

## 2017-10-30 ENCOUNTER — Ambulatory Visit
Admission: RE | Admit: 2017-10-30 | Discharge: 2017-10-30 | Disposition: A | Discharge status: Home / Self Care | Payer: Medicare Other | Source: Ambulatory Visit | Attending: Nephrology | Admitting: Nephrology

## 2017-10-30 DIAGNOSIS — N183 Chronic kidney disease, stage 3 unspecified: Secondary | ICD-10-CM

## 2017-11-04 ENCOUNTER — Ambulatory Visit (HOSPITAL_COMMUNITY)
Admission: RE | Admit: 2017-11-04 | Discharge: 2017-11-04 | Disposition: A | Discharge status: Home / Self Care | Payer: Medicare Other | Source: Ambulatory Visit | Attending: Cardiovascular Disease | Admitting: Cardiovascular Disease

## 2017-11-04 DIAGNOSIS — N281 Cyst of kidney, acquired: Secondary | ICD-10-CM | POA: Diagnosis not present

## 2017-11-04 DIAGNOSIS — K551 Chronic vascular disorders of intestine: Secondary | ICD-10-CM | POA: Diagnosis not present

## 2017-11-04 DIAGNOSIS — E1151 Type 2 diabetes mellitus with diabetic peripheral angiopathy without gangrene: Secondary | ICD-10-CM | POA: Diagnosis not present

## 2017-11-04 DIAGNOSIS — Z951 Presence of aortocoronary bypass graft: Secondary | ICD-10-CM | POA: Diagnosis not present

## 2017-11-04 DIAGNOSIS — E785 Hyperlipidemia, unspecified: Secondary | ICD-10-CM | POA: Diagnosis not present

## 2017-11-04 DIAGNOSIS — I2510 Atherosclerotic heart disease of native coronary artery without angina pectoris: Secondary | ICD-10-CM | POA: Diagnosis not present

## 2017-11-04 DIAGNOSIS — I10 Essential (primary) hypertension: Secondary | ICD-10-CM | POA: Diagnosis not present

## 2017-11-04 DIAGNOSIS — I774 Celiac artery compression syndrome: Secondary | ICD-10-CM | POA: Diagnosis not present

## 2017-11-04 DIAGNOSIS — I701 Atherosclerosis of renal artery: Secondary | ICD-10-CM | POA: Diagnosis not present

## 2017-11-04 DIAGNOSIS — Z87891 Personal history of nicotine dependence: Secondary | ICD-10-CM | POA: Diagnosis not present

## 2017-11-06 ENCOUNTER — Inpatient Hospital Stay (HOSPITAL_COMMUNITY): Admission: RE | Admit: 2017-11-06 | Payer: Medicare Other | Source: Ambulatory Visit

## 2017-11-07 ENCOUNTER — Other Ambulatory Visit: Payer: Self-pay | Admitting: Internal Medicine

## 2017-11-07 DIAGNOSIS — E0590 Thyrotoxicosis, unspecified without thyrotoxic crisis or storm: Secondary | ICD-10-CM

## 2017-11-12 ENCOUNTER — Other Ambulatory Visit: Payer: Self-pay | Admitting: Internal Medicine

## 2017-11-12 ENCOUNTER — Ambulatory Visit
Admission: RE | Admit: 2017-11-12 | Discharge: 2017-11-12 | Disposition: A | Discharge status: Home / Self Care | Payer: Medicare Other | Source: Ambulatory Visit | Attending: Internal Medicine | Admitting: Internal Medicine

## 2017-11-12 DIAGNOSIS — E0590 Thyrotoxicosis, unspecified without thyrotoxic crisis or storm: Secondary | ICD-10-CM

## 2017-11-14 ENCOUNTER — Other Ambulatory Visit: Payer: Self-pay

## 2017-11-14 ENCOUNTER — Encounter: Payer: Self-pay | Admitting: Radiation Oncology

## 2017-11-14 ENCOUNTER — Ambulatory Visit
Admission: RE | Admit: 2017-11-14 | Discharge: 2017-11-14 | Disposition: A | Discharge status: Home / Self Care | Payer: Medicare Other | Source: Ambulatory Visit | Attending: Radiation Oncology | Admitting: Radiation Oncology

## 2017-11-14 VITALS — BP 115/72 | HR 69 | Temp 98.2°F | Resp 18 | Wt 162.2 lb

## 2017-11-14 DIAGNOSIS — Y842 Radiological procedure and radiotherapy as the cause of abnormal reaction of the patient, or of later complication, without mention of misadventure at the time of the procedure: Secondary | ICD-10-CM | POA: Insufficient documentation

## 2017-11-14 DIAGNOSIS — Z79899 Other long term (current) drug therapy: Secondary | ICD-10-CM | POA: Insufficient documentation

## 2017-11-14 DIAGNOSIS — Z7901 Long term (current) use of anticoagulants: Secondary | ICD-10-CM | POA: Diagnosis not present

## 2017-11-14 DIAGNOSIS — D0512 Intraductal carcinoma in situ of left breast: Secondary | ICD-10-CM | POA: Diagnosis present

## 2017-11-14 DIAGNOSIS — Z7984 Long term (current) use of oral hypoglycemic drugs: Secondary | ICD-10-CM | POA: Insufficient documentation

## 2017-11-14 NOTE — Progress Notes (Signed)
Pt here today for a follow up for breast cancer. Pt denies having any pain. Pt denies having any fatigue. Pt states that she has some darkening under her left breast. Pt states that she is still using the radiaplex gel daily.  BP 115/72   Pulse 69   Temp 98.2 F (36.8 C) (Oral)   Resp 18   Wt 162 lb 3.2 oz (73.6 kg)   SpO2 100%   BMI 26.99 kg/m   Wt Readings from Last 3 Encounters:  11/14/17 162 lb 3.2 oz (73.6 kg)  10/29/17 164 lb 3.2 oz (74.5 kg)  08/26/17 162 lb 12.8 oz (73.8 kg)

## 2017-11-14 NOTE — Progress Notes (Signed)
Radiation Oncology         [phone] ________________________________  Name: [name] MRN: [id number]  Date: 11/14/2017  DOB: [birth year]  Follow-Up Visit Note  CC: Prince Solian, MD  Jovita Kussmaul, MD    ICD-10-CM   1. Ductal carcinoma in situ (DCIS) of left breast D05.12     Diagnosis:   Stage 0 (cTis (DCIS), cN0, cM0, G3, ER: Negative, PR: Negative, HER2: Unknown)high-grade,left breast   Interval Since Last Radiation:  4 weeks ago  09/18/2017 - 10/15/2017  1. Left breast, 2.67 Gy in 15 fractions for a total dose of 40.05 Gy  2. Boost, 2 Gy in 5 fractions for a total dose of 10 Gy   1. 3D, 6X  2. 3D, 6X  Narrative:  Pt here today for a follow up for breast cancer. Pt denies having any pain. Pt denies having any fatigue. Pt states that she has some darkening under her left breast. Pt states that she is still using the radiaplex gel daily. Denies having fatigue. Reports feeling a tingle every now and then but denies pain. She denies having any coughing or breathing problems.                              ALLERGIES:  has No Known Allergies.  Meds: Current Outpatient Medications  Medication Sig Dispense Refill  . amLODipine (NORVASC) 10 MG tablet TAKE 1 TABLET BY MOUTH  DAILY 90 tablet 2  . apixaban (ELIQUIS) 5 MG TABS tablet TAKE 1 TABLET(5 MG) BY MOUTH TWICE DAILY 60 tablet 1  . ketorolac (ACULAR) 0.5 % ophthalmic solution Place 2 drops into the left eye 2 (two) times daily.  6  . lansoprazole (PREVACID) 15 MG capsule Take 1 capsule (15 mg total) by mouth daily. 90 capsule 3  . metFORMIN (GLUCOPHAGE) 1000 MG tablet Take 1 tablet (1,000 mg total) by mouth 2 (two) times daily with a meal. 180 tablet 3  . methimazole (TAPAZOLE) 10 MG tablet Take 10 mg by mouth 2 (two) times daily.  5  . metoprolol tartrate (LOPRESSOR) 50 MG tablet TAKE 1 TABLET BY MOUTH  EVERY DAY 90 tablet 2  . nitroGLYCERIN (NITROSTAT) 0.4 MG SL tablet Place 1 tablet (0.4 mg total) under the  tongue every 5 (five) minutes as needed for chest pain. 25 tablet 6  . prochlorperazine (COMPAZINE) 10 MG tablet Take 1 tablet (10 mg total) by mouth every 6 (six) hours as needed for nausea or vomiting. 30 tablet 0  . rosuvastatin (CRESTOR) 40 MG tablet TAKE 1 TABLET BY MOUTH  DAILY 90 tablet 2  . triamterene-hydrochlorothiazide (MAXZIDE-25) 37.5-25 MG tablet TAKE 1 TABLET BY MOUTH  DAILY 90 tablet 2  . valsartan (DIOVAN) 160 MG tablet TAKE 1 TABLET BY MOUTH TWO  TIMES DAILY 180 tablet 2   No current facility-administered medications for this encounter.     Physical Findings: The patient is in no acute distress. Patient is alert and oriented.  weight is 162 lb 3.2 oz (73.6 kg). Her oral temperature is 98.2 F (36.8 C). Her blood pressure is 115/72 and her pulse is 69. Her respiration is 18 and oxygen saturation is 100%. .  No significant changes. Lungs are clear to auscultation bilaterally. Heart has regular rate and rhythm. No palpable cervical, supraclavicular, or axillary adenopathy. Abdomen soft, non-tender, normal bowel sounds.  Right breast has no nipple discharge or bleeding. Patients left breast has some hyperpimentation  changes, skin is well healed, no palpable mass, nipple discharge or bleeding.   Lab Findings: Lab Results  Component Value Date   WBC 5.8 07/17/2017   HGB 11.2 (L) 08/08/2017   HCT 33.0 (L) 08/08/2017   MCV 92.6 07/17/2017   PLT 245 07/17/2017    Radiographic Findings: US Renal Artery Duplex Complete  Result Date: 10/30/2017 CLINICAL DATA:  Chronic kidney disease stage 3, history of right renal artery stent, diabetes EXAM: RENAL/URINARY TRACT ULTRASOUND RENAL DUPLEX DOPPLER ULTRASOUND COMPARISON:  None. FINDINGS: Right Kidney: Length: 11.1 cm. Echogenicity within normal limits. No mass or hydronephrosis visualized. Left Kidney: Length: 11 cm. Echogenicity within normal limits. No hydronephrosis. Left kidney upper and midpole minimally complex cysts noted. Upper  pole cyst measures 1.9 cm. Midpole cyst measures 2.6 cm. Bladder:  Limited assessment.  Under distended. RENAL DUPLEX ULTRASOUND Right Renal Artery Velocities: Origin:  146 cm/sec Mid:  153 cm/sec Hilum:  161 cm/sec Interlobar:  54 cm/sec Arcuate:  26 cm/sec Left Renal Artery Velocities: Origin:  136 cm/sec Mid:  75 cm/sec Hilum:  65 cm/sec Interlobar:  46 cm/sec Arcuate:  22 cm/sec Aortic Velocity:  107 cm/sec Right Renal-Aortic Ratios: Origin: 1.4 Mid:  1.4 Hilum: 1.5 Interlobar: 0.5 Arcuate: 0.2 Left Renal-Aortic Ratios: Origin: 1.3 Mid: 0.7 Hilum: 0.6 Interlobar: 0.4 Arcuate: 40.2 Aortic atherosclerosis evident. No significant peak systolic velocity abnormality or abnormal ratio to suggest renal artery stenosis by ultrasound. Symmetric renal size. IMPRESSION: Aortic atherosclerosis. Negative for significant renal artery stenosis by ultrasound. Electronically Signed   By: Jerilynn Mages.  Shick M.D.   On: 10/30/2017 12:56   US Thyroid  Result Date: 11/12/2017 CLINICAL DATA:  Hyperthyroid. Previous FNA biopsy inferior right nodule 12/26/2011. EXAM: THYROID ULTRASOUND TECHNIQUE: Ultrasound examination of the thyroid gland and adjacent soft tissues was performed. COMPARISON:  12/14/2011 FINDINGS: Parenchymal Echotexture: Moderately heterogenous Isthmus: 0.8 cm thickness, previously 0.4 Right lobe: 7 x 3 x 2 cm, previously 8.3 x 3.4 x 2.7 Left lobe: 5 x 5.6 x 2.3 cm, previously 5.2 x 1.9 x 1.7 _________________________________________________________ Estimated total number of nodules >/= 1 cm: 4 Number of spongiform nodules >/=  2 cm not described below (TR1): 0 Number of mixed cystic and solid nodules >/= 1.5 cm not described below ([place]): 0 _________________________________________________________ Nodule # 1: Location: Right; Superior Maximum size: 1.8 cm; Other 2 dimensions: 1.1 x 1.1 cm Composition: solid/almost completely solid (2) Echogenicity: isoechoic (1) Shape: taller-than-wide (3) Margins: smooth (0) Echogenic foci:  none (0) ACR TI-RADS total points: 6. ACR TI-RADS risk category: TR4 (4-6 points). ACR TI-RADS recommendations: **Given size (>/= 1.5 cm) and appearance, fine needle aspiration of this moderately suspicious nodule should be considered based on TI-RADS criteria. _________________________________________________________ Nodule # 2: Location: Right; Mid Maximum size: 1.4 cm; Other 2 dimensions: 1.1 x 1.1 cm Composition: solid/almost completely solid (2) Echogenicity: isoechoic (1) Shape: not taller-than-wide (0) Margins: smooth (0) Echogenic foci: none (0) ACR TI-RADS total points: 3. ACR TI-RADS risk category: TR3 (3 points). ACR TI-RADS recommendations: Given size (<1.4 cm) and appearance, this nodule does NOT meet TI-RADS criteria for biopsy or dedicated follow-up. _________________________________________________________ Nodule # 3: 4.3 x 3.6 x 3.2 cm inferior right, previously 3.9 x 3.9 x 2.8; this was previously biopsied. Nodule # 4: Location: Left; Inferior Maximum size: 5.6 cm; Other 2 dimensions: 4.9 x 2.9 cm Composition: solid/almost completely solid (2) Echogenicity: hypoechoic (2) Shape: taller-than-wide (3) Margins: smooth (0) Echogenic foci: none (0) ACR TI-RADS total points: 7. ACR TI-RADS risk category: TR5 (>/=  7 points). ACR TI-RADS recommendations: **Given size (>/= 1.0 cm) and appearance, fine needle aspiration of this highly suspicious nodule should be considered based on TI-RADS criteria. _________________________________________________________ IMPRESSION: 1. Thyromegaly with bilateral nodules. 2. Recommend FNA biopsy of 5.6 cm highly suspicious inferior left AND 1.8 cm moderately suspicious superior right nodules. The above is in keeping with the ACR TI-RADS recommendations - J Am Coll Radiol 2017;14:587-595. Electronically Signed   By: Lucrezia Europe M.D.   On: 11/12/2017 15:37    Impression:  The patient is recovering from the effects of radiation.  No evidence of reoccurrence in clinical  exam.  Plan:  Patient will see Dr. Marlou Starks in June and will return for a routine follow up in 6 months with radiation oncology. Patient is not following up with medical oncology. -----------------------------------  Blair Promise, PhD, MD  This document serves as a record of services personally performed by Gery Pray MD. It was created on his behalf by Delton Coombes, a trained medical scribe. The creation of this record is based on the scribe's personal observations and the provider's statements to them.

## 2017-11-26 ENCOUNTER — Other Ambulatory Visit: Payer: Self-pay | Admitting: Internal Medicine

## 2017-11-26 DIAGNOSIS — E042 Nontoxic multinodular goiter: Secondary | ICD-10-CM

## 2017-12-06 ENCOUNTER — Other Ambulatory Visit: Payer: Self-pay | Admitting: *Deleted

## 2017-12-06 DIAGNOSIS — I701 Atherosclerosis of renal artery: Secondary | ICD-10-CM

## 2017-12-06 DIAGNOSIS — I6523 Occlusion and stenosis of bilateral carotid arteries: Secondary | ICD-10-CM

## 2017-12-19 ENCOUNTER — Other Ambulatory Visit (HOSPITAL_COMMUNITY)
Admission: RE | Admit: 2017-12-19 | Discharge: 2017-12-19 | Disposition: A | Discharge status: Home / Self Care | Payer: Medicare Other | Source: Ambulatory Visit | Attending: Radiology | Admitting: Radiology

## 2017-12-19 ENCOUNTER — Ambulatory Visit
Admission: RE | Admit: 2017-12-19 | Discharge: 2017-12-19 | Disposition: A | Discharge status: Home / Self Care | Payer: Medicare Other | Source: Ambulatory Visit | Attending: Internal Medicine | Admitting: Internal Medicine

## 2017-12-19 DIAGNOSIS — E041 Nontoxic single thyroid nodule: Secondary | ICD-10-CM | POA: Diagnosis present

## 2017-12-19 DIAGNOSIS — E042 Nontoxic multinodular goiter: Secondary | ICD-10-CM

## 2017-12-19 DIAGNOSIS — Z853 Personal history of malignant neoplasm of breast: Secondary | ICD-10-CM | POA: Diagnosis present

## 2017-12-27 ENCOUNTER — Telehealth: Payer: Self-pay | Admitting: Cardiovascular Disease

## 2017-12-27 ENCOUNTER — Other Ambulatory Visit: Payer: Self-pay | Admitting: Cardiovascular Disease

## 2017-12-27 NOTE — Telephone Encounter (Signed)
New Message    *STAT* If patient is at the pharmacy, call can be transferred to refill team.   1. Which medications need to be refilled? (please list name of each medication and dose if known) apixaban (ELIQUIS) 5 MG TABS tablet  2. Which pharmacy/location (including street and city if local pharmacy) is medication to be sent to? [facility] [place] [place] [facility] [place]  3. Do they need a 30 day or 90 day supply? [place]

## 2017-12-30 ENCOUNTER — Other Ambulatory Visit: Payer: Self-pay | Admitting: Pharmacist

## 2017-12-30 MED ORDER — APIXABAN 5 MG PO TABS: ORAL_TABLET | ORAL | 1 refills | Status: DC

## 2017-12-30 NOTE — Telephone Encounter (Signed)
Sent!

## 2018-03-25 ENCOUNTER — Other Ambulatory Visit: Payer: Self-pay | Admitting: Cardiovascular Disease

## 2018-04-04 ENCOUNTER — Telehealth: Payer: Self-pay

## 2018-04-07 NOTE — Telephone Encounter (Signed)
Opened in error. Arthur Speagle,CMA

## 2018-05-12 ENCOUNTER — Other Ambulatory Visit: Payer: Self-pay | Admitting: Cardiovascular Disease

## 2018-05-12 MED ORDER — VALSARTAN 160 MG PO TABS: 160.0000 mg | ORAL_TABLET | Freq: Two times a day (BID) | ORAL | 1 refills | Status: DC

## 2018-05-12 NOTE — Telephone Encounter (Signed)
°*  STAT* If patient is at the pharmacy, call can be transferred to refill team.   1. Which medications need to be refilled? (please list name of each medication and dose if known) Valsartanan 160 mg- need new prescription- [facility] has the medicine on back order  2. Which pharmacy/location (including street and city if local pharmacy) is medication to be sent to?[facility] [phone]  3. Do they need a 30 day or 90 day supply? Does not know-needs a month supply

## 2018-05-22 ENCOUNTER — Encounter: Payer: Self-pay | Admitting: Radiation Oncology

## 2018-05-22 ENCOUNTER — Other Ambulatory Visit: Payer: Self-pay

## 2018-05-22 ENCOUNTER — Ambulatory Visit
Admission: RE | Admit: 2018-05-22 | Discharge: 2018-05-22 | Disposition: A | Discharge status: Home / Self Care | Payer: Medicare Other | Source: Ambulatory Visit | Attending: Radiation Oncology | Admitting: Radiation Oncology

## 2018-05-22 VITALS — BP 122/62 | HR 49 | Temp 98.2°F | Resp 18 | Ht 65.0 in | Wt 165.2 lb

## 2018-05-22 DIAGNOSIS — Z79899 Other long term (current) drug therapy: Secondary | ICD-10-CM | POA: Insufficient documentation

## 2018-05-22 DIAGNOSIS — D0512 Intraductal carcinoma in situ of left breast: Secondary | ICD-10-CM | POA: Diagnosis present

## 2018-05-22 DIAGNOSIS — Z7901 Long term (current) use of anticoagulants: Secondary | ICD-10-CM | POA: Insufficient documentation

## 2018-05-22 DIAGNOSIS — Z923 Personal history of irradiation: Secondary | ICD-10-CM | POA: Diagnosis not present

## 2018-05-22 DIAGNOSIS — Z7984 Long term (current) use of oral hypoglycemic drugs: Secondary | ICD-10-CM | POA: Diagnosis not present

## 2018-05-22 NOTE — Progress Notes (Signed)
Radiation Oncology         [phone] ________________________________  Name: [name] MRN: [id number]  Date: 05/22/2018  DOB: 1951/03/31  Follow-Up Visit Note  CC: Elaine Solian, MD  Elaine Kussmaul, MD    ICD-10-CM   1. Ductal carcinoma in situ (DCIS) of left breast D05.12     Diagnosis:   Stage 0 (cTis (DCIS), cN0, cM0, G3, ER: Negative, PR: Negative, HER2: Unknown)high-grade,left breast  Interval Since Last Radiation:  7 months  Radiation treatment dates:   09/18/2017-10/15/2017 Site/dose:   1. Left breast / 40.05 Gy in 15 fractions 2. Boost / 10 Gy in 5 fractions  Narrative:  The patient returns today for routine follow-up. She recently saw Dr. Marlou [name] with good report. She states that she will get another mammogram in January or February 2020. She reports her thyroid issues are being managed by her PCP, Dr. Dagmar [name].  On review of systems, the patient denies fatigue. She denies any pain in her breast. She denies any nipple discharge or bleeding. She reports slight darkening of her breast. She states that she is not using any cream or lotion on her breast.                           ALLERGIES:  has No Known Allergies.  Meds: Current Outpatient Medications  Medication Sig Dispense Refill  . amLODipine (NORVASC) 10 MG tablet TAKE 1 TABLET BY MOUTH  DAILY 90 tablet 2  . ELIQUIS 5 MG TABS tablet TAKE 1 TABLET(5 MG) BY MOUTH TWICE DAILY 180 tablet 1  . ketorolac (ACULAR) 0.5 % ophthalmic solution Place 2 drops into the left eye 2 (two) times daily.  6  . lansoprazole (PREVACID) 15 MG capsule Take 1 capsule (15 mg total) by mouth daily. 90 capsule 3  . metFORMIN (GLUCOPHAGE) 1000 MG tablet Take 1 tablet (1,000 mg total) by mouth 2 (two) times daily with a meal. 180 tablet 3  . methimazole (TAPAZOLE) 10 MG tablet Take 10 mg by mouth 2 (two) times daily.  5  . metoprolol tartrate (LOPRESSOR) 50 MG tablet TAKE 1 TABLET BY MOUTH  EVERY DAY 90 tablet 2  . nitroGLYCERIN  (NITROSTAT) 0.4 MG SL tablet Place 1 tablet (0.4 mg total) under the tongue every 5 (five) minutes as needed for chest pain. 25 tablet 6  . rosuvastatin (CRESTOR) 40 MG tablet TAKE 1 TABLET BY MOUTH  DAILY 90 tablet 2  . triamterene-hydrochlorothiazide (MAXZIDE-25) 37.5-25 MG tablet TAKE 1 TABLET BY MOUTH  DAILY 90 tablet 2  . valsartan (DIOVAN) 160 MG tablet Take 1 tablet (160 mg total) by mouth 2 (two) times daily. 180 tablet 1  . prochlorperazine (COMPAZINE) 10 MG tablet Take 1 tablet (10 mg total) by mouth every 6 (six) hours as needed for nausea or vomiting. (Patient not taking: Reported on 05/22/2018) 30 tablet 0   No current facility-administered medications for this encounter.     Physical Findings: The patient is in no acute distress. Patient is alert and oriented.  height is _0  (1.651 m) and weight is 165 lb 4 oz (75 kg). Her oral temperature is 98.2 F (36.8 C). Her blood pressure is 122/62 and her pulse is 49 (abnormal). Her respiration is 18 and oxygen saturation is 100%.   Lungs are clear to auscultation bilaterally. Heart has regular rate and rhythm. No palpable cervical, supraclavicular, or axillary adenopathy. Abdomen soft, non-tender, normal bowel sounds.  Left Breast:  Patient continues to have some hyperpigmentation changes in the breast. Lumpectomy scar in the medial breast is well-healed. No palpable mass, nipple discharge or bleeding. Right Breast: No palpable mass, nipple discharge or bleeding.  Lab Findings: Lab Results  Component Value Date   WBC 5.8 07/17/2017   HGB 11.2 (L) 08/08/2017   HCT 33.0 (L) 08/08/2017   MCV 92.6 07/17/2017   PLT 245 07/17/2017    Radiographic Findings: No results found.  Impression:  No evidence of recurrence on clinical exam. Patient reports that she has been released from medical oncology and surgery and therefore will follow up with her every 6 months.  Plan:  Patient will proceed with mammography in February or March of next  year. Routine follow-up in radiation oncology in 6 months.  ____________________________________  Blair Promise, PhD, MD  This document serves as a record of services personally performed by Gery Pray, MD. It was created on his behalf by Rae Lips, a trained medical scribe. The creation of this record is based on the scribe's personal observations and the provider's statements to them. This document has been checked and approved by the attending provider.

## 2018-05-22 NOTE — Progress Notes (Signed)
Pt presents today for f/u with Dr. Sondra Come. Pt is unaccompanied. Pt denies fatigue. Pt denies c/o pain in breast. Pt reports breast is slightly hyperpigmented. Pt states she does not use any cream/lotion on breast.   BP 122/62 (BP Location: Right Arm, Patient Position: Sitting)   Pulse (!) 49   Temp 98.2 F (36.8 C) (Oral)   Resp 18   Ht 5\' 5"  (1.651 m)   Wt 165 lb 4 oz (75 kg)   SpO2 100%   BMI 27.50 kg/m   Wt Readings from Last 3 Encounters:  05/22/18 165 lb 4 oz (75 kg)  11/14/17 162 lb 3.2 oz (73.6 kg)  10/29/17 164 lb 3.2 oz (74.5 kg)   Loma Sousa, RN BSN

## 2018-06-16 ENCOUNTER — Other Ambulatory Visit: Payer: Self-pay | Admitting: Cardiovascular Disease

## 2018-06-16 NOTE — Telephone Encounter (Signed)
Rx request sent to pharmacy.  

## 2018-07-08 ENCOUNTER — Other Ambulatory Visit: Payer: Self-pay | Admitting: Internal Medicine

## 2018-07-09 NOTE — Telephone Encounter (Signed)
Rx(s) sent to pharmacy electronically.  

## 2018-07-30 ENCOUNTER — Ambulatory Visit (HOSPITAL_COMMUNITY)
Admission: RE | Admit: 2018-07-30 | Discharge: 2018-07-30 | Disposition: A | Discharge status: Home / Self Care | Payer: Medicare Other | Source: Ambulatory Visit | Attending: Cardiovascular Disease | Admitting: Cardiovascular Disease

## 2018-07-30 ENCOUNTER — Other Ambulatory Visit: Payer: Self-pay

## 2018-07-30 ENCOUNTER — Encounter (INDEPENDENT_AMBULATORY_CARE_PROVIDER_SITE_OTHER): Payer: Self-pay

## 2018-07-30 DIAGNOSIS — I6523 Occlusion and stenosis of bilateral carotid arteries: Secondary | ICD-10-CM | POA: Diagnosis present

## 2018-08-01 ENCOUNTER — Other Ambulatory Visit: Payer: Self-pay

## 2018-08-01 DIAGNOSIS — I6523 Occlusion and stenosis of bilateral carotid arteries: Secondary | ICD-10-CM

## 2018-09-01 ENCOUNTER — Other Ambulatory Visit: Payer: Self-pay

## 2018-09-01 MED ORDER — APIXABAN 5 MG PO TABS: 5.0000 mg | ORAL_TABLET | Freq: Two times a day (BID) | ORAL | 1 refills | Status: DC

## 2018-10-28 ENCOUNTER — Other Ambulatory Visit: Payer: Self-pay | Admitting: Cardiovascular Disease

## 2018-10-28 ENCOUNTER — Other Ambulatory Visit: Payer: Self-pay | Admitting: Internal Medicine

## 2018-10-28 ENCOUNTER — Telehealth: Payer: Self-pay | Admitting: Cardiovascular Disease

## 2018-10-28 NOTE — Telephone Encounter (Signed)
Maxide refilled.

## 2018-10-28 NOTE — Telephone Encounter (Signed)
Valsartan 160 mg refilled.

## 2018-10-30 ENCOUNTER — Telehealth (INDEPENDENT_AMBULATORY_CARE_PROVIDER_SITE_OTHER): Payer: Medicare Other | Admitting: Cardiovascular Disease

## 2018-10-30 ENCOUNTER — Encounter: Payer: Self-pay | Admitting: Cardiovascular Disease

## 2018-10-30 VITALS — BP 136/78 | HR 75 | Ht 66.5 in | Wt 173.0 lb

## 2018-10-30 DIAGNOSIS — I480 Paroxysmal atrial fibrillation: Secondary | ICD-10-CM

## 2018-10-30 DIAGNOSIS — I6523 Occlusion and stenosis of bilateral carotid arteries: Secondary | ICD-10-CM

## 2018-10-30 DIAGNOSIS — E785 Hyperlipidemia, unspecified: Secondary | ICD-10-CM

## 2018-10-30 DIAGNOSIS — E1151 Type 2 diabetes mellitus with diabetic peripheral angiopathy without gangrene: Secondary | ICD-10-CM

## 2018-10-30 DIAGNOSIS — I701 Atherosclerosis of renal artery: Secondary | ICD-10-CM | POA: Diagnosis not present

## 2018-10-30 DIAGNOSIS — I739 Peripheral vascular disease, unspecified: Secondary | ICD-10-CM

## 2018-10-30 DIAGNOSIS — Z7901 Long term (current) use of anticoagulants: Secondary | ICD-10-CM | POA: Diagnosis not present

## 2018-10-30 DIAGNOSIS — I4811 Longstanding persistent atrial fibrillation: Secondary | ICD-10-CM

## 2018-10-30 DIAGNOSIS — N184 Chronic kidney disease, stage 4 (severe): Secondary | ICD-10-CM

## 2018-10-30 DIAGNOSIS — I25718 Atherosclerosis of autologous vein coronary artery bypass graft(s) with other forms of angina pectoris: Secondary | ICD-10-CM | POA: Diagnosis not present

## 2018-10-30 DIAGNOSIS — I10 Essential (primary) hypertension: Secondary | ICD-10-CM

## 2018-10-30 MED ORDER — METOPROLOL TARTRATE 25 MG PO TABS: 25.0000 mg | ORAL_TABLET | Freq: Two times a day (BID) | ORAL | 3 refills | Status: DC

## 2018-10-30 NOTE — Patient Instructions (Addendum)
Medication Instructions:  Decrease Metoprolol Tartrate 50 mg 1/2 tablet ( 25 mg ) twice a day Your new prescription will be 25 mg tablet take 1 tablet twice a day If you need a refill on your cardiac medications before your next appointment, please call your pharmacy.   Lab work: None ordered  Testing/Procedures: Dopplers ABI's to be done same day as Carotids 08/03/19 at 11:00 am   Follow-Up: At [facility], you and your health needs are our priority.  As part of our continuing mission to provide you with exceptional heart care, we have created designated Provider Care Teams.  These Care Teams include your primary Cardiologist (physician) and Advanced Practice Providers (APPs -  Physician Assistants and Nurse Practitioners) who all work together to provide you with the care you need, when you need it. . Schedule follow up appointment with Dr.Croitoru in 12 months  Call 3 months before to schedule

## 2018-10-30 NOTE — Addendum Note (Signed)
Addended by: Kathyrn Lass on: 10/30/2018 05:22 PM   Modules accepted: Orders

## 2018-10-30 NOTE — Progress Notes (Signed)
Virtual Visit via Telephone Note   This visit type was conducted due to national recommendations for restrictions regarding the COVID-19 Pandemic (e.g. social distancing) in an effort to limit this patient's exposure and mitigate transmission in our community.  Due to her co-morbid illnesses, this patient is at least at moderate risk for complications without adequate follow up.  This format is felt to be most appropriate for this patient at this time.  The patient did not have access to video technology/had technical difficulties with video requiring transitioning to audio format only (telephone).  All issues noted in this document were discussed and addressed.  No physical exam could be performed with this format.  Please refer to the patient's chart for her  consent to telehealth for [facility].   Evaluation Performed:  Follow-up visit  Date:  10/30/2018   ID:  [name], DOB 1951-06-27, MRN [id number]  Patient Location: Home Provider Location: Home  PCP:  Prince Solian, MD  Cardiologist:  Sanda Klein, MD  Electrophysiologist:  None   Chief Complaint:  CAD/PAD follow up, AFib  History of Present Illness:    [name] is a 68 y.o. female with early onset and extensive atherosclerotic disease involving the coronary, carotid, renal, mesenteric and lower extremity arterial beds.  She has type 2 diabetes mellitus, hypercholesterolemia and moderate chronic kidney disease.  She appears to be an long-term persistent (possibly permanent) atrial fibrillation.  She has preserved left ventricular systolic function and does not have manifestations of congestive heart failure.  The patient specifically denies any chest pain at rest exertion, dyspnea at rest or with exertion, orthopnea, paroxysmal nocturnal dyspnea, syncope, palpitations, focal neurological deficits, intermittent claudication, lower extremity edema, unexplained weight gain, cough, hemoptysis or wheezing.  She does  not have any symptoms that would suggest mesenteric angina.  She has occasional mild swelling of the left foot that promptly resolves after lying in bed overnight.  She notices that her heart rhythm is persistently irregular (she can hear of her irregular rhythm in her ears).  She last had labs with Dr. Dagmar Hait in November 2019 and is scheduled to have a virtual visit with him next month.  She is scheduled to see Dr. Lorrene Reid for a nephrology appointment in July.  Her last hemoglobin A1c November was excellent at 5.9% and all her lipid parameters are in desirable range.  Her creatinine is stable at 2.1 and her most recent hemoglobin was 11.6.  She had CABG X 3 with LIMA-LAD, SVG-OM,SVG-RCA 2004. Re studied in 2005 and this showed patent grafts, with distal LAD and OM disease. Her Myoview in 7/12 was low risk, but repeat in 11/2013 showed new anterolateral ischemia. She has no good revascularization options in the anterior wall. Although the LIMA anastomosis is patent there is no retrograde flow to the proximal LAD and the distal LAD is diffusely diseased and not amenable to PCI. Her most recent echocardiogram in January 2014 showed normal left ventricular systolic function and regional wall motion. Echocardiogram in January 2014 showed normal left ventricular systolic function and wall motion with an estimated systolic PA pressure of 44 mm Hg  In March of 2013 she had DCCV. She was in normal sinus rhythm in August 2015 in the clinic. In September 2016, December 2016 and January 2018 she presented with rate controlled asymptomatic atrial fibrillation. She has PAD and has had prior renal and popliteal stenting.  - Last lower extremity arterial Doppler was performed in April 2016 and showed  bilateral moderate stenosis in the superficial femoral arteries with three-vessel runoff, ABI 0.69 on right, 0.7 on left. - She had left carotid stenting 02/12/12 and post op she was hypotensive and bradycardic.  A carotid  Doppler scan performed in January 2020 showed widely patent left carotid stent and 40-59% right internal carotid stenosis (an improvement from the year before). 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 She has hyperlipidemia, diabetes mellitus with type 2 and hypertension.  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  Her husband and her son are both still working and lives in the same household, but has not had any symptoms of disease.  They are also being careful.  She understands that she is at increased risk of complications with a potential COVID-19 infection.  The patient does not have symptoms concerning for COVID-19 infection (fever, chills, cough, or new shortness of breath).    Past Medical History:  Diagnosis Date  . Acute kidney failure ([place])   . ASHD (arteriosclerotic heart disease)    native vessel  . Atrial fibrillation ([place])   . Breast cancer, left ([place])   . CAD (coronary artery disease) 2004   multivessel, status post CABG x3  . Carotid artery disease ([place])    stent in 2013  . CKD (chronic kidney disease), stage III ([place])   . Diabetes ([place])   . GERD (gastroesophageal reflux disease)   . History of tobacco abuse   . Hx of adenomatous polyp of colon 07/26/2016  . Hyperlipidemia   . Hypertension   . Hyperthyroidism   . Myocardial infarction ([place]) 2002  . Peripheral vascular disease ([facility])    S/P popliteal artery and renal artery stents  . Renovascular hypertension   . S/P CABG x 3 2004  . Thyroid goiter   . Tobacco abuse   . Vaginal cancer ([place]) 1983   Past Surgical History:  Procedure Laterality Date  . BREAST LUMPECTOMY WITH RADIOACTIVE SEED AND SENTINEL LYMPH NODE BIOPSY  Left 08/08/2017   Procedure: LEFT BREAST LUMPECTOMY WITH RADIOACTIVE SEED AND SENTINEL LYMPH NODE BIOPSY;  Surgeon: Jovita Kussmaul, MD;  Location: [place];  Service: General;  Laterality: Left;  . CARDIAC CATHETERIZATION  09/23/2002   3 vessel CAD >> CABG (Dr. Marella Chimes)  . CARDIAC CATHETERIZATION  12/23/2003   patent LIMA, LAD, SVG to OM1, SVG to RCA, distal disease beyond graft insertion (Dr. Marella Chimes)  . CARDIOVERSION  09/11/2011   Procedure: CARDIOVERSION;  Surgeon: Sanda Klein, MD;  Location: [place];  Service: Cardiovascular;  Laterality: N/A;  . CAROTID ANGIOGRAM Bilateral 02/12/2012   Procedure: CAROTID ANGIOGRAM;  Surgeon: Serafina Mitchell, MD;  Location: [facility] CATH LAB;  Service: Cardiovascular;  Laterality: Bilateral;  . CAROTID DOPPLER  03/2013   right bulb & prox ICA w/50-69% stenosis, L ICA stent open/patent  . CAROTID STENT  02/12/2012   Carotid Stent Insertion, Carotid Angiogram - 9x40 Cordis Precise stent  (Dr. Pierre Bali)  . CAROTID STENT INSERTION N/A 02/12/2012   Procedure: CAROTID STENT INSERTION;  Surgeon: Serafina Mitchell, MD;  Location: [facility] CATH LAB;  Service: Cardiovascular;  Laterality: N/A;  . CORONARY ARTERY BYPASS GRAFT  09/24/2002   LIMA to LAD,  SVG to Cfx, SVG to RCA (Dr. Prescott Gum)  . LOWER EXTREMITY ARTERIAL DOPPLER  09/2012   bilateral ABIS (mod arterial insuff at rest); R SFA 70-99% stenosis, L SFA 50-69% stenosis  . NM MYOCAR PERF WALL MOTION  12/2010   persnatine - moderate perfusion defect in apical anterior/apical/basal inferolateral/mid inferolateral/apical lateral walls - EF 65% - low risk  . RENAL ARTERY ANGIOPLASTY  02/11/2003   transluminal angioplasty & stent wtih 4x76mm Genesis balloon expandable stent premounted on Cordis aviatory balloon (Dr. Charlyne Petrin)  . RENAL DOPPLER  09/2012   R prox renal artery @ stent equal/less that 60% diameter reduction; L renal artery 1-59% stenosis  . TRANSTHORACIC ECHOCARDIOGRAM  07/2012   EF 55-65%, mild  conc LVH, grade 1 diastolic dysfunction; calcicifed MV annulus, mild MR; LA mild-mod dilated; RA mod dilated; mod TR; PA peak pressure 74mmHg, RVSP increased (pulm htn)     Current Meds  Medication Sig  . amLODipine (NORVASC) 10 MG tablet TAKE 1 TABLET BY MOUTH  DAILY  . apixaban (ELIQUIS) 5 MG TABS tablet Take 1 tablet (5 mg total) by mouth 2 (two) times daily.  Marland Kitchen ketorolac (ACULAR) 0.5 % ophthalmic solution Place 2 drops into the left eye 2 (two) times daily.  . lansoprazole (PREVACID) 15 MG capsule Take 1 capsule (15 mg total) by mouth daily.  . metFORMIN (GLUCOPHAGE) 1000 MG tablet Take 1 tablet (1,000 mg total) by mouth 2 (two) times daily with a meal.  . metoprolol tartrate (LOPRESSOR) 50 MG tablet TAKE 1 TABLET BY MOUTH  DAILY  . nitroGLYCERIN (NITROSTAT) 0.4 MG SL tablet Place 1 tablet (0.4 mg total) under the tongue every 5 (five) minutes as needed for chest pain.  . rosuvastatin (CRESTOR) 40 MG tablet Take 1 tablet (40 mg total) by mouth daily.  Marland Kitchen triamterene-hydrochlorothiazide (MAXZIDE-25) 37.5-25 MG tablet TAKE 1 TABLET BY MOUTH  DAILY  . valsartan (DIOVAN) 160 MG tablet TAKE 1 TABLET BY MOUTH TWO  TIMES DAILY     Allergies:   Patient has no known allergies.   Social History   Tobacco Use  . Smoking status: Former Smoker    Packs/day: 1.00    Years: 30.00    Pack years: 30.00    Last attempt to quit: 07/02/2001    Years since quitting: 17.3  . Smokeless tobacco: Never Used  Substance Use Topics  . Alcohol use: Yes    Alcohol/week: 2.0 - 3.0 standard drinks    Types: 2 - 3 Standard drinks or equivalent per week    Frequency: Never    Comment: socially  . Drug use: No     Family Hx: The patient's family history includes Deep vein thrombosis in her sister; Diabetes in her brother and sister; Heart disease in her sister; Hyperlipidemia in her sister and son; Hypertension in her mother, sister, and son; Other in her brother and mother. There is no history of Colon cancer,  Stomach cancer, Esophageal cancer, Rectal cancer, or Liver cancer.  ROS:   Please see the history of present illness.     All other systems reviewed and are negative.   Prior CV studies:   The following studies were reviewed today:  Carotid duplex ultrasound January 2020, renal duplex ultrasound May 2019, labs November 2019 from [facility].  Labs/Other Tests and Data Reviewed:    EKG:  An ECG dated 07/21/2017 was personally reviewed today and demonstrated:  Atrial fibrillation with controlled ventricular response, incomplete right bundle branch block, diffuse nonspecific  ST segment depression  Recent Labs: No results found for requested labs within last 8760 hours.   Recent Lipid Panel Lab Results  Component Value Date/Time   CHOL 101 11/30/2013 10:14 AM   TRIG 70 11/30/2013 10:14 AM   HDL 43 11/30/2013 10:14 AM   CHOLHDL 2.3 11/30/2013 10:14 AM   LDLCALC 44 11/30/2013 10:14 AM    Wt Readings from Last 3 Encounters:  10/30/18 173 lb (78.5 kg)  05/22/18 165 lb 4 oz (75 kg)  11/14/17 162 lb 3.2 oz (73.6 kg)     Objective:    Vital Signs:  BP 136/78   Pulse 75   Ht 5' 6.5" (1.689 m)   Wt 173 lb (78.5 kg)   BMI 27.50 kg/m    VITAL SIGNS:  reviewed Unable to examine  ASSESSMENT & PLAN:    1. AFib:   This is now probably a permanent arrhythmia and is asymptomatic, well rate controlled, appropriately anticoagulated. 2. CAD:  Remains free of angina pectoris.   Preserved left ventricular systolic function.  She is known to have anterolateral ischemia by nuclear stress testing (patent LIMA to LAD but with occlusion of the LAD without retrograde flow to proximal vessel, diffuse distal LAD disease), but is best suited for medical treatment. There are no good options for revascularization. 3. Carotid stenosis: Patent left carotid stent.  Reported increase in velocities in the right internal carotid in 2019 was not confirmed on this year's study.  We will continue yearly  evaluation.  We will repeat yearly. 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 There is also 50% stenosis in the M1 segment of the left middle cerebral artery. No neurological complaints.  4. PAD:  Denies intermittent claudication.  Probably time to reevaluate her ABIs, last checked in 2016 when she had moderate reduction in bilateral arterial flow (R ABI 0.69, LABI 0.79).  She should continue to exercise/daily walking to help promote collateral formation. 5. S/P R renal stent:  Stable renal function and well-controlled hypertension.  I do not think we need to do yearly duplex ultrasonography. 6. HLP: Excellent lipid profile last November 7. DM: Exceptionally good controlled with hemoglobin A1c 5.9% last November.  However she has gained about 11 pounds over the last year and is now mildly overweight.  Would advise her to avoid sweets and starches with high glycemic index. 8. Eliquis: Despite renal dysfunction she is still on a full dose of this medication in view of young age and normal by mass.  CHADSVasc 5 (age, gender, diabetes mellitus, hypertension, vascular disease). No serious bleeding complications. 9. CKD 4:  Stable creatinine around 2.1, GFR 25-30. Seeing dr. Lorrene Reid in July. 10. HTN: Acceptable control, although ideally 130/80 or less (she reports that this is what her blood pressure runs most days).  Her metoprolol tartrate should be taken twice daily (we will send in a new prescription for 25 mg twice daily).   COVID-19 Education: The signs and symptoms of COVID-19 were discussed with the patient and how to seek care for testing (follow up with PCP or arrange E-visit).  The importance of social distancing was discussed today.  Time:   Today, I have spent 26 minutes with the patient with telehealth technology discussing the above problems.     Medication Adjustments/Labs and Tests  Ordered: Current medicines are reviewed at length with the patient today.  Concerns regarding medicines are  outlined above.   Tests Ordered: No orders of the defined types were placed in this encounter.   Medication Changes: No orders of the defined types were placed in this encounter.   Disposition:  Follow up 12 months  Signed, Sanda Klein, MD  10/30/2018 10:54 AM    [place]

## 2018-11-05 ENCOUNTER — Telehealth: Payer: Self-pay | Admitting: *Deleted

## 2018-11-05 NOTE — Telephone Encounter (Signed)
Pt call wanted to cancel appt due covid-19. Will call back to reschedule at a later time.

## 2018-11-12 ENCOUNTER — Other Ambulatory Visit: Payer: Self-pay | Admitting: Cardiovascular Disease

## 2018-11-20 ENCOUNTER — Ambulatory Visit: Payer: Self-pay | Admitting: Radiation Oncology

## 2018-12-16 ENCOUNTER — Telehealth: Payer: Self-pay | Admitting: *Deleted

## 2018-12-16 DIAGNOSIS — I10 Essential (primary) hypertension: Secondary | ICD-10-CM

## 2018-12-16 DIAGNOSIS — E785 Hyperlipidemia, unspecified: Secondary | ICD-10-CM

## 2018-12-16 DIAGNOSIS — N184 Chronic kidney disease, stage 4 (severe): Secondary | ICD-10-CM

## 2018-12-16 MED ORDER — ROSUVASTATIN CALCIUM 10 MG PO TABS: 10.0000 mg | ORAL_TABLET | Freq: Every day | ORAL | 3 refills | Status: DC

## 2018-12-16 NOTE — Telephone Encounter (Signed)
-----   Message from Sanda Klein, MD sent at 12/13/2018 10:05 AM EDT ----- Please reduce the rosuvastatin to 10 mg a day and recheck CMET and lipids in roughly 3 months (increased risk of side effects with current kidney function level).

## 2018-12-16 NOTE — Telephone Encounter (Signed)
The patient has been called and made aware to reduce the Rosuvastatin to 10 mg once daily. The medication has been sent to [facility]. Lab orders have been placed for 3 months from now.

## 2018-12-24 ENCOUNTER — Other Ambulatory Visit: Payer: Self-pay

## 2018-12-24 MED ORDER — ROSUVASTATIN CALCIUM 10 MG PO TABS: 10.0000 mg | ORAL_TABLET | Freq: Every day | ORAL | 3 refills | Status: DC

## 2018-12-29 NOTE — Telephone Encounter (Signed)
Opened in error

## 2019-01-29 ENCOUNTER — Encounter: Payer: Self-pay | Admitting: *Deleted

## 2019-02-04 ENCOUNTER — Other Ambulatory Visit: Payer: Self-pay | Admitting: Cardiovascular Disease

## 2019-02-12 ENCOUNTER — Other Ambulatory Visit: Payer: Self-pay | Admitting: Internal Medicine

## 2019-02-12 DIAGNOSIS — Z853 Personal history of malignant neoplasm of breast: Secondary | ICD-10-CM

## 2019-02-20 ENCOUNTER — Other Ambulatory Visit: Payer: Self-pay

## 2019-02-20 ENCOUNTER — Ambulatory Visit
Admission: RE | Admit: 2019-02-20 | Discharge: 2019-02-20 | Disposition: A | Discharge status: Home / Self Care | Payer: Medicare Other | Source: Ambulatory Visit | Attending: Internal Medicine | Admitting: Internal Medicine

## 2019-02-20 DIAGNOSIS — Z853 Personal history of malignant neoplasm of breast: Secondary | ICD-10-CM

## 2019-03-20 ENCOUNTER — Other Ambulatory Visit: Payer: Self-pay | Admitting: Cardiovascular Disease

## 2019-05-08 ENCOUNTER — Other Ambulatory Visit: Payer: Self-pay | Admitting: Cardiovascular Disease

## 2019-05-26 ENCOUNTER — Other Ambulatory Visit: Payer: Self-pay | Admitting: Cardiovascular Disease

## 2019-05-26 MED ORDER — APIXABAN 5 MG PO TABS: 5.0000 mg | ORAL_TABLET | Freq: Two times a day (BID) | ORAL | 1 refills | Status: DC

## 2019-06-01 IMAGING — MG 2D DIGITAL DIAGNOSTIC BILATERAL MAMMOGRAM WITH CAD AND ADJUNCT T
8 of 12 series · 8 of 28 positions shown · non-contrast
Comparison: Previous exam(s).

CLINICAL DATA: Follow-up right breast mass

EXAM:
2D DIGITAL DIAGNOSTIC BILATERAL MAMMOGRAM WITH CAD AND ADJUNCT TOMO
ULTRASOUND LEFT BREAST

[L MLO synth-2D]
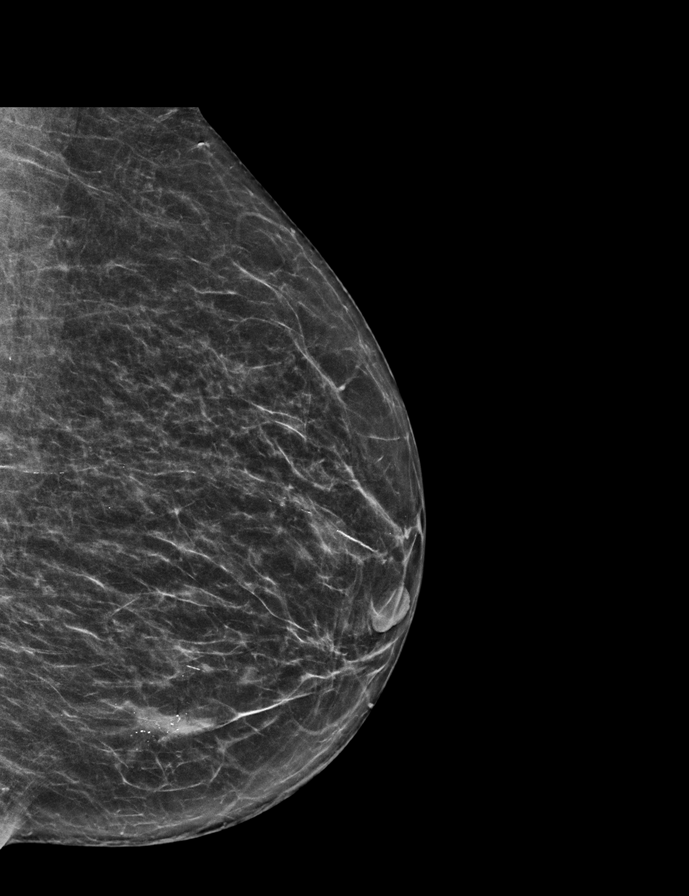

[R MLO]
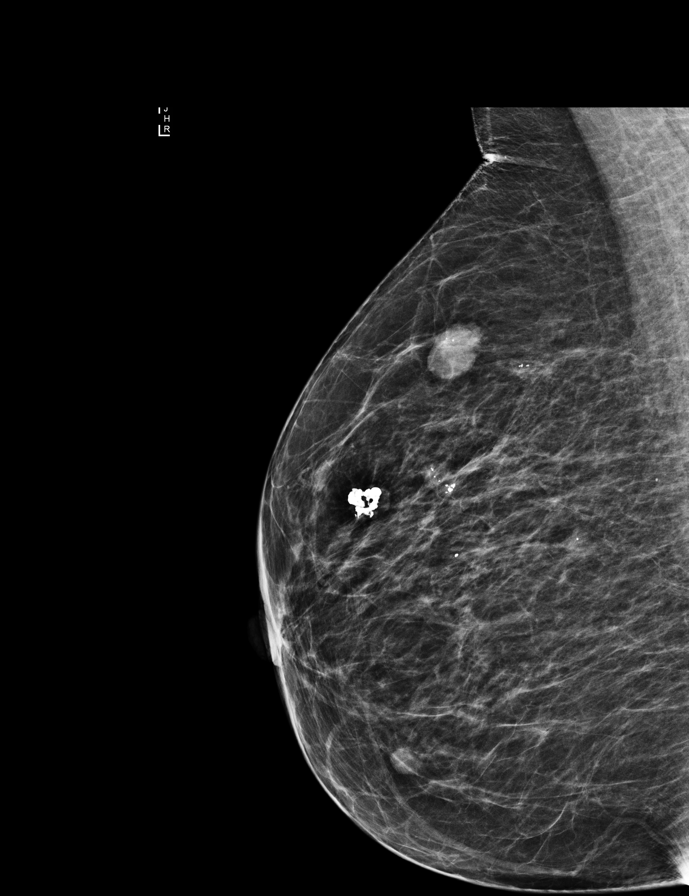

[L CC synth-2D]
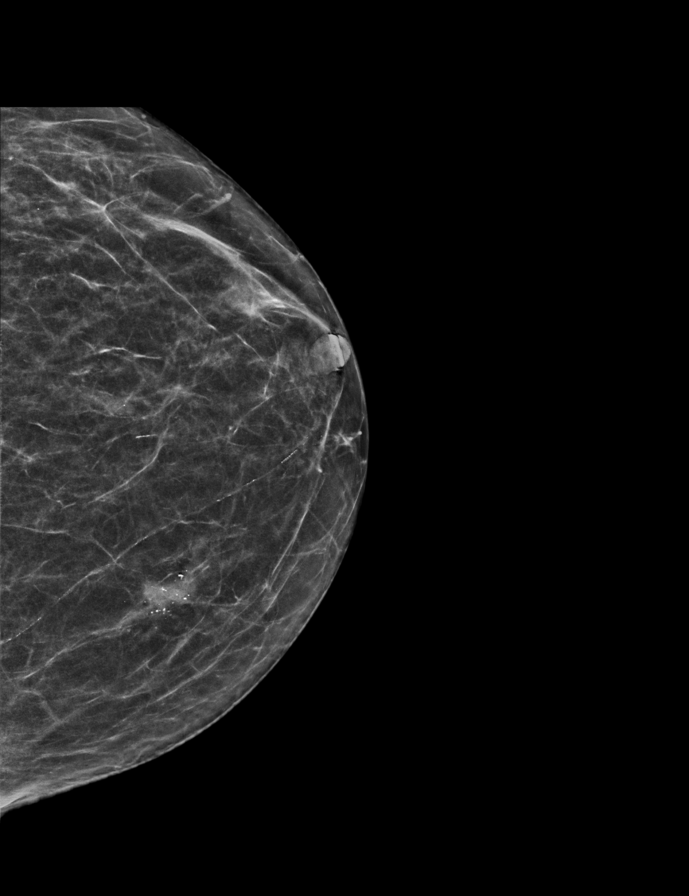

[L CC]
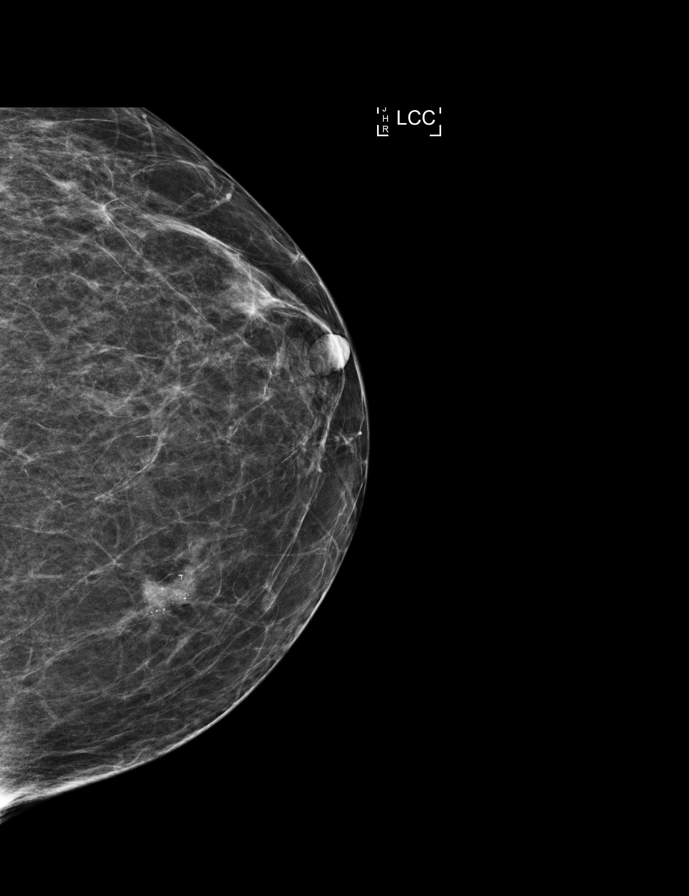

[R MLO synth-2D]
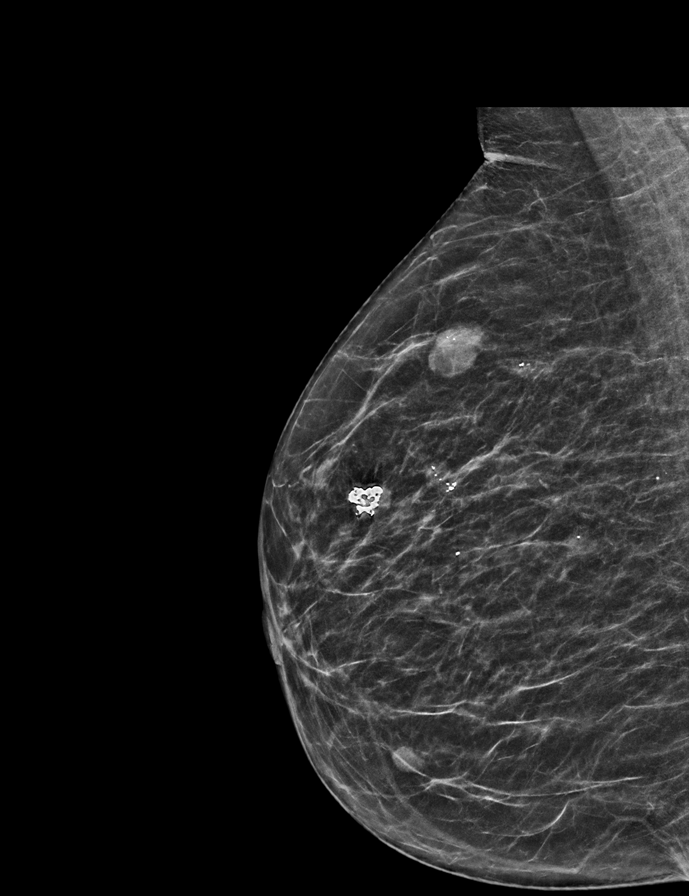

[R CC synth-2D]
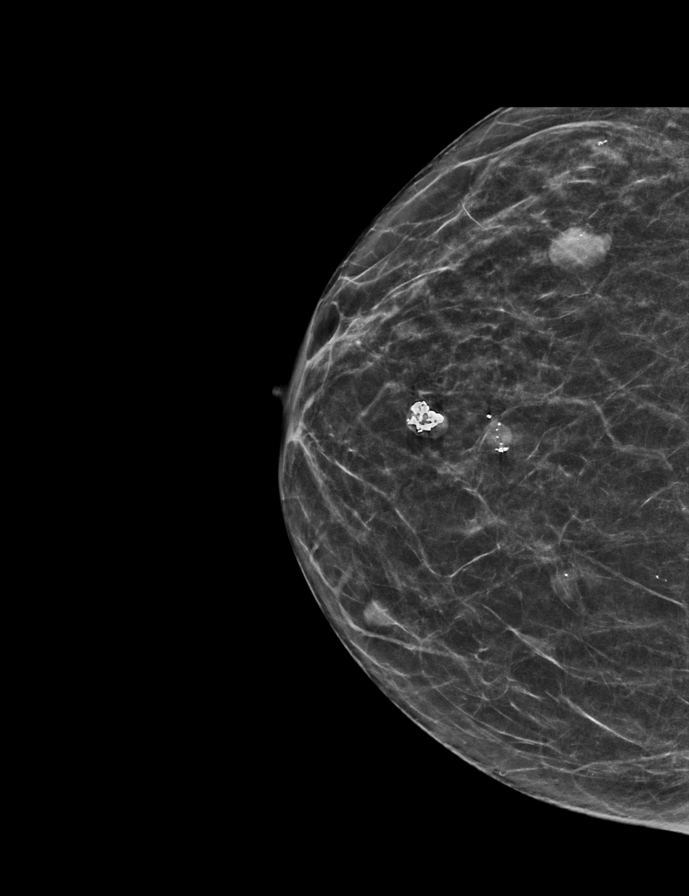

[L MLO]
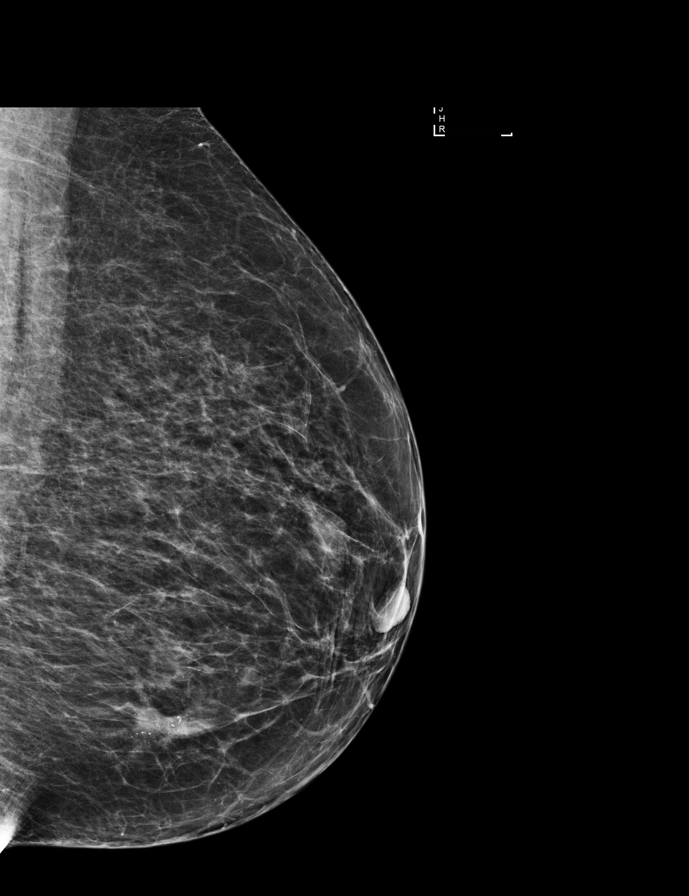

[R CC]
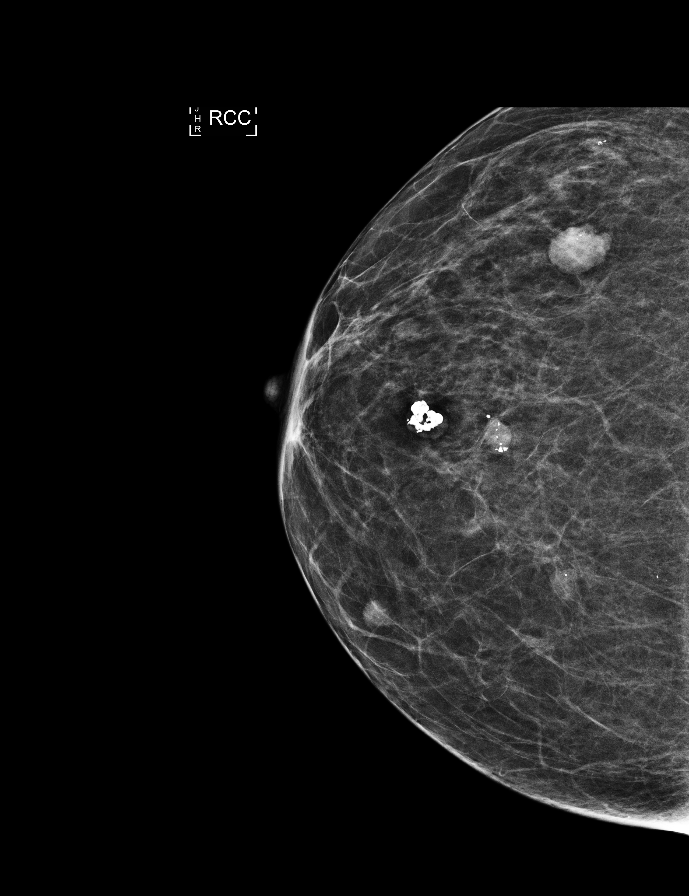

[8 of 28 positions shown; findings below may reference images not displayed]

ACR Breast Density Category b: There are scattered areas of
fibroglandular density.
FINDINGS: The mass in the lateral right breast, followed in 1661, is stable
mammographically. Multiple masses on the right are stable. There is
a new focal asymmetry in the medial inferior left breast containing
the amorphic calcifications. This focal asymmetry is irregular in
shape and measures up to 1.8 cm. No other suspicious findings.

Mammographic images were processed with CAD.

On physical exam, no suspicious lumps are identified.

Targeted ultrasound is performed, showing a mass in the left breast
at 9 o'clock, 4 cm from the nipple measuring 1.4 x 1.9 x 1.2 cm.
This mass contains calcifications in correlates with the
mammographic finding. No adenopathy.
IMPRESSION: New mass in the left breast at 9 o'clock, 4 cm from the nipple
containing pleomorphism calcifications.

RECOMMENDATION:
Recommend ultrasound-guided biopsy of the new left breast mass.

I have discussed the findings and recommendations with the patient.
Results were also provided in writing at the conclusion of the
visit. If applicable, a reminder letter will be sent to the patient
regarding the next appointment.

BI-RADS CATEGORY  4: Suspicious.

## 2019-06-17 ENCOUNTER — Encounter: Payer: Self-pay | Admitting: *Deleted

## 2019-06-18 ENCOUNTER — Encounter: Payer: Self-pay | Admitting: *Deleted

## 2019-07-08 ENCOUNTER — Other Ambulatory Visit: Payer: Self-pay

## 2019-07-08 DIAGNOSIS — Z20822 Contact with and (suspected) exposure to covid-19: Secondary | ICD-10-CM

## 2019-07-10 ENCOUNTER — Telehealth: Payer: Self-pay | Admitting: Internal Medicine

## 2019-07-10 LAB — NOVEL CORONAVIRUS, NAA: SARS-CoV-2, NAA: NOT DETECTED

## 2019-07-10 NOTE — Telephone Encounter (Signed)
Patient is calling to receive her negative COVID test results. Patient expressed understanding.

## 2019-07-30 ENCOUNTER — Telehealth: Payer: Self-pay | Admitting: Cardiovascular Disease

## 2019-07-30 NOTE — Telephone Encounter (Signed)
We are recommending the COVID-19 vaccine to all of our patients. Cardiac medications (including blood thinners) should not deter anyone from being vaccinated and there is no need to hold any of those medications prior to vaccine administration.     Currently, there is a hotline to call (active 07/10/19) to schedule vaccination appointments as no walk-ins will be accepted.   Number: [phone].    If an appointment is not available please go to [email or web address] to sign up for notification when additional vaccine appointments are available.   If you have further questions or concerns about the vaccine process, please visit [email or web address] or contact your primary care physician.  Patient verbalized understanding.

## 2019-08-03 ENCOUNTER — Ambulatory Visit (HOSPITAL_BASED_OUTPATIENT_CLINIC_OR_DEPARTMENT_OTHER)
Admission: RE | Admit: 2019-08-03 | Discharge: 2019-08-03 | Disposition: A | Discharge status: Home / Self Care | Payer: Medicare PPO | Source: Ambulatory Visit | Attending: Cardiovascular Disease | Admitting: Cardiovascular Disease

## 2019-08-03 ENCOUNTER — Ambulatory Visit (HOSPITAL_COMMUNITY)
Admission: RE | Admit: 2019-08-03 | Discharge: 2019-08-03 | Disposition: A | Discharge status: Home / Self Care | Payer: Medicare PPO | Source: Ambulatory Visit | Attending: Cardiology | Admitting: Cardiology

## 2019-08-03 ENCOUNTER — Other Ambulatory Visit: Payer: Self-pay

## 2019-08-03 DIAGNOSIS — I701 Atherosclerosis of renal artery: Secondary | ICD-10-CM

## 2019-08-03 DIAGNOSIS — Z7901 Long term (current) use of anticoagulants: Secondary | ICD-10-CM | POA: Insufficient documentation

## 2019-08-03 DIAGNOSIS — N184 Chronic kidney disease, stage 4 (severe): Secondary | ICD-10-CM

## 2019-08-03 DIAGNOSIS — I25718 Atherosclerosis of autologous vein coronary artery bypass graft(s) with other forms of angina pectoris: Secondary | ICD-10-CM | POA: Diagnosis not present

## 2019-08-03 DIAGNOSIS — E785 Hyperlipidemia, unspecified: Secondary | ICD-10-CM | POA: Insufficient documentation

## 2019-08-03 DIAGNOSIS — I6523 Occlusion and stenosis of bilateral carotid arteries: Secondary | ICD-10-CM

## 2019-08-03 DIAGNOSIS — I480 Paroxysmal atrial fibrillation: Secondary | ICD-10-CM

## 2019-08-03 DIAGNOSIS — I739 Peripheral vascular disease, unspecified: Secondary | ICD-10-CM | POA: Diagnosis not present

## 2019-08-03 DIAGNOSIS — I4811 Longstanding persistent atrial fibrillation: Secondary | ICD-10-CM | POA: Insufficient documentation

## 2019-08-03 DIAGNOSIS — I10 Essential (primary) hypertension: Secondary | ICD-10-CM | POA: Insufficient documentation

## 2019-08-03 DIAGNOSIS — E1151 Type 2 diabetes mellitus with diabetic peripheral angiopathy without gangrene: Secondary | ICD-10-CM

## 2019-08-05 ENCOUNTER — Other Ambulatory Visit: Payer: Self-pay | Admitting: *Deleted

## 2019-08-05 DIAGNOSIS — I6523 Occlusion and stenosis of bilateral carotid arteries: Secondary | ICD-10-CM

## 2019-09-12 ENCOUNTER — Ambulatory Visit: Payer: Medicare PPO | Attending: Internal Medicine

## 2019-09-12 DIAGNOSIS — Z23 Encounter for immunization: Secondary | ICD-10-CM

## 2019-09-12 NOTE — Progress Notes (Signed)
   Covid-19 Vaccination Clinic  Name:  [name]    MRN: [id number] DOB: [birth year]  09/12/2019  [name] was observed post Covid-19 immunization for 15 minutes without incident. She was provided with Vaccine Information Sheet and instruction to access the V-Safe system.   [name] was instructed to call 911 with any severe reactions post vaccine: Marland Kitchen Difficulty breathing  . Swelling of face and throat  . A fast heartbeat  . A bad rash all over body  . Dizziness and weakness   Immunizations Administered    Name Date Dose VIS Date Route   Pfizer COVID-19 Vaccine 09/12/2019  9:19 AM 0.3 mL 06/12/2019 Intramuscular   Manufacturer: [place]   Lot: [id number]   [place]: [address]-6

## 2019-10-05 ENCOUNTER — Ambulatory Visit: Payer: Medicare PPO | Attending: Internal Medicine

## 2019-10-05 ENCOUNTER — Ambulatory Visit: Payer: Medicare PPO

## 2019-10-05 DIAGNOSIS — Z23 Encounter for immunization: Secondary | ICD-10-CM

## 2019-10-05 NOTE — Progress Notes (Signed)
   Covid-19 Vaccination Clinic  Name:  [name]    MRN: [id number] DOB: [birth year]  10/05/2019  [name] was observed post Covid-19 immunization for 15 minutes without incident. She was provided with Vaccine Information Sheet and instruction to access the V-Safe system.   [name] was instructed to call 911 with any severe reactions post vaccine: Marland Kitchen Difficulty breathing  . Swelling of face and throat  . A fast heartbeat  . A bad rash all over body  . Dizziness and weakness   Immunizations Administered    Name Date Dose VIS Date Route   Pfizer COVID-19 Vaccine 10/05/2019  4:46 PM 0.3 mL 06/12/2019 Intramuscular   Manufacturer: [place]   Lot: [id number]   [place]: [id number]

## 2019-10-22 ENCOUNTER — Encounter: Payer: Self-pay | Admitting: Cardiovascular Disease

## 2019-11-18 ENCOUNTER — Encounter (INDEPENDENT_AMBULATORY_CARE_PROVIDER_SITE_OTHER): Payer: Medicare PPO | Admitting: Ophthalmology

## 2019-12-18 ENCOUNTER — Other Ambulatory Visit: Payer: Self-pay

## 2019-12-18 MED ORDER — METOPROLOL TARTRATE 25 MG PO TABS: 25.0000 mg | ORAL_TABLET | Freq: Two times a day (BID) | ORAL | 0 refills | Status: DC

## 2019-12-28 NOTE — Progress Notes (Signed)
**Note De-Identified via [name]** Cardiology Office Note   Date:  12/28/2019   ID:  [name], DOB Aug 14, 1950, MRN [id number]  PCP:  Elaine Solian, MD  Cardiologist:  Dr.Croitoru  CC:  Follow Up Atrial Fib    History of Present Illness: [name] is a 69 y.o. female who presents for ongoing assessment and management of atherosclerotic disease involving the coronary arteries (status post CABG, with LIMA to LAD, occlusion of the LAD without retrograde flow to the proximal vessel, diffuse LAD disease, (no good options for revascularization),  carotid arteries (left carotid artery stent) the renal arteries (status post renal artery stent), mesenteric arteries and lower extremity arterial beds.    Other history includes hypercholesterolemia, long-term persistent atrial fibrillation on Eliquis CHADS VASC Score of 5,, moderate chronic kidney disease.  She is followed by Dr. Lorrene Reid for nephrology  On last office visit with Dr. Sallyanne [name] on 10/30/2018, she remained in atrial fibrillation and rate was well controlled.  She was not complaining of any angina symptoms.  No additional testing or medication changes were made at that time.  She is here today to be seen in person after several virtual visits. She did see her PCP, Dr. Dagmar Hait via virtual visit and when he saw her vital signs it did indicate that her heart rate was in the 60s and he recommended that she follow-up with cardiology to evaluate heart rate further and need for medication adjustments.  She denies chest discomfort, dizziness, syncope, near syncope, dyspnea or fatigue. She states that "I feel fine" and has had no change in her energy with heart rate. She is medically compliant.  Past Medical History:  Diagnosis Date  . Acute kidney failure ([place])   . ASHD (arteriosclerotic heart disease)    native vessel  . Atrial fibrillation ([place])   . Breast cancer, left ([place])   . CAD (coronary artery disease) 2004   multivessel, status post CABG x3  . Carotid artery  disease ([place])    stent in 2013  . CKD (chronic kidney disease), stage III ([place])   . Diabetes ([place])   . GERD (gastroesophageal reflux disease)   . History of tobacco abuse   . Hx of adenomatous polyp of colon 07/26/2016  . Hyperlipidemia   . Hypertension   . Hyperthyroidism   . Myocardial infarction ([name]) 2002  . Peripheral vascular disease ([facility])    S/P popliteal artery and renal artery stents  . Renovascular hypertension   . S/P CABG x 3 2004  . Thyroid goiter   . Tobacco abuse   . Vaginal cancer ([name]) 1983    Past Surgical History:  Procedure Laterality Date  . BREAST LUMPECTOMY WITH RADIOACTIVE SEED AND SENTINEL LYMPH NODE BIOPSY Left 08/08/2017   Procedure: LEFT BREAST LUMPECTOMY WITH RADIOACTIVE SEED AND SENTINEL LYMPH NODE BIOPSY;  Surgeon: Elaine Kussmaul, MD;  Location: [facility];  Service: General;  Laterality: Left;  . CARDIAC CATHETERIZATION  09/23/2002   3 vessel CAD >> CABG (Dr. Marella [name])  . CARDIAC CATHETERIZATION  12/23/2003   patent LIMA, LAD, SVG to OM1, SVG to RCA, distal disease beyond graft insertion (Dr. Marella [name])  . CARDIOVERSION  09/11/2011   Procedure: CARDIOVERSION;  Surgeon: Sanda Klein, MD;  Location: [place] [facility];  Service: Cardiovascular;  Laterality: N/A;  . CAROTID ANGIOGRAM Bilateral 02/12/2012   Procedure: CAROTID ANGIOGRAM;  Surgeon: Elaine Mitchell, MD;  Location: [facility] CATH LAB;  Service: Cardiovascular;  Laterality: Bilateral;  . CAROTID DOPPLER  03/2013  right bulb & prox ICA w/50-69% stenosis, L ICA stent open/patent  . CAROTID STENT  02/12/2012   Carotid Stent Insertion, Carotid Angiogram - 9x40 Cordis Precise stent  (Dr. Pierre [name])  . CAROTID STENT INSERTION N/A 02/12/2012   Procedure: CAROTID STENT INSERTION;  Surgeon: Elaine Mitchell, MD;  Location: [facility] CATH LAB;  Service: Cardiovascular;  Laterality: N/A;  . CORONARY ARTERY BYPASS GRAFT  09/24/2002   LIMA to LAD, SVG to Cfx, SVG to RCA (Dr. Prescott [name])  . LOWER EXTREMITY ARTERIAL  DOPPLER  09/2012   bilateral ABIS (mod arterial insuff at rest); R SFA 70-99% stenosis, L SFA 50-69% stenosis  . NM MYOCAR PERF WALL MOTION  12/2010   persnatine - moderate perfusion defect in apical anterior/apical/basal inferolateral/mid inferolateral/apical lateral walls - EF 65% - low risk  . RENAL ARTERY ANGIOPLASTY  02/11/2003   transluminal angioplasty & stent wtih 4x62mm Genesis balloon expandable stent premounted on Cordis aviatory balloon (Dr. Charlyne [name])  . RENAL DOPPLER  09/2012   R prox renal artery @ stent equal/less that 60% diameter reduction; L renal artery 1-59% stenosis  . TRANSTHORACIC ECHOCARDIOGRAM  07/2012   EF 55-65%, mild conc LVH, grade 1 diastolic dysfunction; calcicifed MV annulus, mild MR; LA mild-mod dilated; RA mod dilated; mod TR; PA peak pressure 80mmHg, RVSP increased (pulm htn)     Current Outpatient Medications  Medication Sig Dispense Refill  . amLODipine (NORVASC) 10 MG tablet TAKE 1 TABLET BY MOUTH  DAILY 90 tablet 3  . apixaban (ELIQUIS) 5 MG TABS tablet Take 1 tablet (5 mg total) by mouth 2 (two) times daily. 180 tablet 1  . ketorolac (ACULAR) 0.5 % ophthalmic solution Place 2 drops into the left eye 2 (two) times daily.  6  . lansoprazole (PREVACID) 15 MG capsule Take 1 capsule (15 mg total) by mouth daily. 90 capsule 3  . metFORMIN (GLUCOPHAGE) 1000 MG tablet Take 1 tablet (1,000 mg total) by mouth 2 (two) times daily with a meal. 180 tablet 3  . metoprolol tartrate (LOPRESSOR) 25 MG tablet Take 1 tablet (25 mg total) by mouth 2 (two) times daily. 180 tablet 0  . nitroGLYCERIN (NITROSTAT) 0.4 MG SL tablet Place 1 tablet (0.4 mg total) under the tongue every 5 (five) minutes as needed for chest pain. 25 tablet 6  . rosuvastatin (CRESTOR) 10 MG tablet Take 1 tablet (10 mg total) by mouth daily. 90 tablet 3  . triamterene-hydrochlorothiazide (MAXZIDE-25) 37.5-25 MG tablet TAKE 1 TABLET BY MOUTH  DAILY 90 tablet 3  . valsartan (DIOVAN) 160 MG tablet TAKE 1  TABLET BY MOUTH  TWICE DAILY 180 tablet 3   No current facility-administered medications for this visit.    Allergies:   Patient has no known allergies.    Social History:  The patient  reports that she quit smoking about 18 years ago. She has a 30.00 pack-year smoking history. She has never used smokeless tobacco. She reports current alcohol use of about 2.0 - 3.0 standard drinks of alcohol per week. She reports that she does not use drugs.   Family History:  The patient's family history includes Deep vein thrombosis in her sister; Diabetes in her brother and sister; Heart disease in her sister; Hyperlipidemia in her sister and son; Hypertension in her mother, sister, and son; Other in her brother and mother.    ROS: All other systems are reviewed and negative. Unless otherwise mentioned in H&P    PHYSICAL EXAM: VS:  There were no  vitals taken for this visit. , BMI There is no height or weight on file to calculate BMI. GEN: Well nourished, well developed, in no acute distress HEENT: normal Neck: no JVD, carotid bruits, or masses Cardiac: IRRR; no murmurs, rubs, or gallops,no edema  Respiratory:  Clear to auscultation bilaterally, normal work of breathing GI: soft, nontender, nondistended, + BS MS: no deformity or atrophy Skin: warm and dry, no rash Neuro:  Strength and sensation are intact Psych: euthymic mood, full affect   EKG: Atrial fibrillation, incomplete right bundle branch block, T wave flattening noted inferior laterally with T wave inversion noted in V3 (personally reviewed).  Recent Labs: No results found for requested labs within last 8760 hours.    Lipid Panel    Component Value Date/Time   CHOL 101 11/30/2013 1014   TRIG 70 11/30/2013 1014   HDL 43 11/30/2013 1014   CHOLHDL 2.3 11/30/2013 1014   VLDL 14 11/30/2013 1014   LDLCALC 44 11/30/2013 1014      Wt Readings from Last 3 Encounters:  10/30/18 173 lb (78.5 kg)  05/22/18 165 lb 4 oz (75 kg)   11/14/17 162 lb 3.2 oz (73.6 kg)      Other studies Reviewed:  ABI' 08/22/19 Right: Resting right ankle-brachial index indicates moderate right lower  extremity arterial disease. The right toe-brachial index is abnormal.   Left: Resting left ankle-brachial index is within normal range. No  evidence of significant left lower extremity arterial disease. The left  toe-brachial index is normal.    Renal Artery Doppler 11/04/2017 Right: Normal size right kidney. Abnormal right Resistive Index.     Normal cortical thickness of right kidney. Essentially stable     1-59% stenosis of the right renal artery, s/p stent     placement. RRV flow present.  Left: Normal size of left kidney. Abnormal left Resistive Index.     Abnormal cortical thickness of the left kidney. No evidence     of left renal artery stenosis. LRV flow present. Multiple     cysts noted in the left kidney, with largest seen in the     upper pole measuring 2.2 x 1.9 x 2.2 cm.  Mesenteric:  Now 70 to 99% stenosis in the celiac artery and superior mesenteric  artery.     ASSESSMENT AND PLAN:  1. Atrial fibrillation: Heart rate is well controlled. She is asymptomatic with heart rates in the 60s. She denies any symptoms indicating significant bradycardia causing her to be fatigued, dizziness, or near syncope. Since she is tolerating this heart rate I do not feel the need to make medication changes in her AV nodal blocking agents at this time. She will continue metoprolol 25 mg twice daily. I have explained to her however that if she does have some symptoms as we have discussed she is to let us know so that we may be able to adjust her medications. For now no changes. Continue apixaban.  2. Hypertension: On multiple medications due to renal artery stenosis, with history of hypertension. Blood pressure is excellently controlled today at 104/58. She is completely asymptomatic with low normal blood  pressure. We will not make any changes in her medication regimen continue amlodipine triamterene HCTZ valsartan and metoprolol. Should she be symptomatic with dizziness or significant fatigue may need to consider changes in medication at that time.  3. Non-insulin-dependent diabetes: Continue Metformin twice daily. Followed by PCP. Recent labs have been drawn.  4. Dyslipidemia: Managed by PCP. Continue rosuvastatin 10  mg daily.  Current medicines are reviewed at length with the patient today.  I have spent 25 minutes dedicated to the care of this patient on the date of this encounter to include pre-visit review of records, assessment, management and diagnostic testing,with shared decision making.  Labs/ tests ordered today include: None Phill Myron. West Pugh, ANP, AACC   12/28/2019 11:18 AM    [facility] [address] Office [phone] Fax [phone]  Notice: This dictation was prepared with Dragon dictation along with smaller phrase technology. Any transcriptional errors that result from this process are unintentional and may not be corrected upon review.

## 2019-12-29 ENCOUNTER — Encounter: Payer: Self-pay | Admitting: Adult Health

## 2019-12-29 ENCOUNTER — Other Ambulatory Visit: Payer: Self-pay

## 2019-12-29 ENCOUNTER — Ambulatory Visit: Payer: Medicare PPO | Admitting: Adult Health

## 2019-12-29 VITALS — BP 104/58 | HR 64 | Ht 66.5 in | Wt 177.0 lb

## 2019-12-29 DIAGNOSIS — Z7901 Long term (current) use of anticoagulants: Secondary | ICD-10-CM | POA: Diagnosis not present

## 2019-12-29 DIAGNOSIS — I4811 Longstanding persistent atrial fibrillation: Secondary | ICD-10-CM

## 2019-12-29 DIAGNOSIS — I10 Essential (primary) hypertension: Secondary | ICD-10-CM

## 2019-12-29 DIAGNOSIS — E1151 Type 2 diabetes mellitus with diabetic peripheral angiopathy without gangrene: Secondary | ICD-10-CM | POA: Diagnosis not present

## 2019-12-29 DIAGNOSIS — E785 Hyperlipidemia, unspecified: Secondary | ICD-10-CM

## 2019-12-29 MED ORDER — APIXABAN 5 MG PO TABS: 5.0000 mg | ORAL_TABLET | Freq: Two times a day (BID) | ORAL | 1 refills | Status: DC

## 2019-12-29 NOTE — Patient Instructions (Signed)
Medication Instructions:  Continue current medications  *If you need a refill on your cardiac medications before your next appointment, please call your pharmacy*   Lab Work: None Ordered   Testing/Procedures: None Ordered   Follow-Up: At [facility], you and your health needs are our priority.  As part of our continuing mission to provide you with exceptional heart care, we have created designated Provider Care Teams.  These Care Teams include your primary Cardiologist (physician) and Advanced Practice Providers (APPs -  Physician Assistants and Nurse Practitioners) who all work together to provide you with the care you need, when you need it.  We recommend signing up for the patient portal called "MyChart".  Sign up information is provided on this After Visit Summary.  MyChart is used to connect with patients for Virtual Visits (Telemedicine).  Patients are able to view lab/test results, encounter notes, upcoming appointments, etc.  Non-urgent messages can be sent to your provider as well.   To learn more about what you can do with MyChart, go to [email or web address].    Your next appointment:   6 month(s)  The format for your next appointment:   In Person  Provider:   You may see Mihai Croitoru, MD or one of the following Advanced Practice Providers on your designated Care Team:    Hao Meng, PA-C  Angela Duke, PA-C or   Krista Kroeger, PA-C     

## 2019-12-29 NOTE — Progress Notes (Signed)
TY

## 2020-01-14 ENCOUNTER — Other Ambulatory Visit: Payer: Self-pay | Admitting: Internal Medicine

## 2020-01-14 DIAGNOSIS — Z9889 Other specified postprocedural states: Secondary | ICD-10-CM

## 2020-01-21 ENCOUNTER — Other Ambulatory Visit (INDEPENDENT_AMBULATORY_CARE_PROVIDER_SITE_OTHER): Payer: Self-pay | Admitting: Ophthalmology

## 2020-01-22 ENCOUNTER — Other Ambulatory Visit: Payer: Self-pay | Admitting: Cardiovascular Disease

## 2020-02-23 ENCOUNTER — Ambulatory Visit
Admission: RE | Admit: 2020-02-23 | Discharge: 2020-02-23 | Disposition: A | Discharge status: Home / Self Care | Payer: Medicare PPO | Source: Ambulatory Visit | Attending: Internal Medicine | Admitting: Internal Medicine

## 2020-02-23 ENCOUNTER — Other Ambulatory Visit: Payer: Self-pay

## 2020-02-23 DIAGNOSIS — R928 Other abnormal and inconclusive findings on diagnostic imaging of breast: Secondary | ICD-10-CM | POA: Diagnosis not present

## 2020-02-23 DIAGNOSIS — Z9889 Other specified postprocedural states: Secondary | ICD-10-CM

## 2020-02-23 DIAGNOSIS — Z853 Personal history of malignant neoplasm of breast: Secondary | ICD-10-CM | POA: Diagnosis not present

## 2020-03-16 DIAGNOSIS — Z951 Presence of aortocoronary bypass graft: Secondary | ICD-10-CM | POA: Diagnosis not present

## 2020-03-16 DIAGNOSIS — N183 Chronic kidney disease, stage 3 unspecified: Secondary | ICD-10-CM | POA: Diagnosis not present

## 2020-03-16 DIAGNOSIS — N189 Chronic kidney disease, unspecified: Secondary | ICD-10-CM | POA: Diagnosis not present

## 2020-03-16 DIAGNOSIS — Z8679 Personal history of other diseases of the circulatory system: Secondary | ICD-10-CM | POA: Diagnosis not present

## 2020-03-16 DIAGNOSIS — D631 Anemia in chronic kidney disease: Secondary | ICD-10-CM | POA: Diagnosis not present

## 2020-03-16 DIAGNOSIS — E1122 Type 2 diabetes mellitus with diabetic chronic kidney disease: Secondary | ICD-10-CM | POA: Diagnosis not present

## 2020-03-16 DIAGNOSIS — N1832 Chronic kidney disease, stage 3b: Secondary | ICD-10-CM | POA: Diagnosis not present

## 2020-03-16 DIAGNOSIS — E559 Vitamin D deficiency, unspecified: Secondary | ICD-10-CM | POA: Diagnosis not present

## 2020-03-16 DIAGNOSIS — I129 Hypertensive chronic kidney disease with stage 1 through stage 4 chronic kidney disease, or unspecified chronic kidney disease: Secondary | ICD-10-CM | POA: Diagnosis not present

## 2020-03-26 ENCOUNTER — Other Ambulatory Visit: Payer: Self-pay | Admitting: Cardiovascular Disease

## 2020-04-19 ENCOUNTER — Encounter: Payer: Self-pay | Admitting: Gastroenterology

## 2020-04-19 ENCOUNTER — Ambulatory Visit: Payer: Medicare PPO | Admitting: Gastroenterology

## 2020-04-19 VITALS — BP 134/72 | HR 88 | Ht 64.57 in | Wt 177.0 lb

## 2020-04-19 DIAGNOSIS — R195 Other fecal abnormalities: Secondary | ICD-10-CM | POA: Diagnosis not present

## 2020-04-19 NOTE — Progress Notes (Addendum)
04/19/2020 [name] [id number] 1950/11/01   HISTORY OF PRESENT ILLNESS: This is a pleasant 69 year old female is a patient of Dr. Celesta Aver. She is known to him only for colonoscopy in January 2018 at which time that was performed for Hemoccult positive stools. She had never had colonoscopy prior to that. She was only found to have internal hemorrhoids and a diminutive polyp that was a tubular adenoma and was put in for a 5-year recall. She is here today again at the request of her PCP, Dr. Dagmar Hait, for evaluation regarding Hemoccult positive stool. It looks like she had a Hemoccult positive stool on June 21 and prior to that on June 15 she had a positive Hemosure. She denies seeing any blood in her stool or any black stools. Her hemoglobin is normal at 12.4 grams and her MCV is normal at 98. She denies absolutely any GI complaints. She tells me that her weight fluctuates up and down a little bit and her appetite does as well due to thyroid issues.  It looks like her weight has been creeping up over the past 3 years. She does not have any family history of gastrointestinal malignancies. She is on Eliquis for atrial fibrillation/cardiac issues.   Past Medical History:  Diagnosis Date  . Acute kidney failure ([place])   . ASHD (arteriosclerotic heart disease)    native vessel  . Atrial fibrillation ([place])   . Breast cancer, left ([place])   . CAD (coronary artery disease) 2004   multivessel, status post CABG x3  . Carotid artery disease ([place])    stent in 2013  . CKD (chronic kidney disease), stage III ([place])   . Diabetes ([place])   . GERD (gastroesophageal reflux disease)   . History of tobacco abuse   . Hx of adenomatous polyp of colon 07/26/2016  . Hyperlipidemia   . Hypertension   . Hyperthyroidism   . Myocardial infarction ([place]) 2002  . Peripheral vascular disease ([facility])    S/P popliteal artery and renal artery stents  . Renovascular hypertension   . S/P CABG x 3 2004  . Thyroid goiter    . Tobacco abuse   . Vaginal cancer ([place]) 1983   Past Surgical History:  Procedure Laterality Date  . BREAST LUMPECTOMY WITH RADIOACTIVE SEED AND SENTINEL LYMPH NODE BIOPSY Left 08/08/2017   Procedure: LEFT BREAST LUMPECTOMY WITH RADIOACTIVE SEED AND SENTINEL LYMPH NODE BIOPSY;  Surgeon: Jovita Kussmaul, MD;  Location: [place];  Service: General;  Laterality: Left;  . CARDIAC CATHETERIZATION  09/23/2002   3 vessel CAD >> CABG (Dr. Marella Chimes)  . CARDIAC CATHETERIZATION  12/23/2003   patent LIMA, LAD, SVG to OM1, SVG to RCA, distal disease beyond graft insertion (Dr. Marella Chimes)  . CARDIOVERSION  09/11/2011   Procedure: CARDIOVERSION;  Surgeon: Sanda Klein, MD;  Location: [place];  Service: Cardiovascular;  Laterality: N/A;  . CAROTID ANGIOGRAM Bilateral 02/12/2012   Procedure: CAROTID ANGIOGRAM;  Surgeon: Serafina Mitchell, MD;  [facility] CATH LAB;  Service: Cardiovascular;  Laterality: Bilateral;  . CAROTID DOPPLER  03/2013   right bulb & prox ICA w/50-69% stenosis, L ICA stent open/patent  . CAROTID STENT  02/12/2012   Carotid Stent Insertion, Carotid Angiogram - 9x40 Cordis Precise stent  (Dr. Pierre Bali)  . CAROTID STENT INSERTION N/A 02/12/2012   Procedure: CAROTID STENT INSERTION;  Surgeon: Serafina Mitchell, MD;  Location: [facility] CATH LAB;  Service: Cardiovascular;  Laterality: N/A;  . CORONARY  ARTERY BYPASS GRAFT  09/24/2002   LIMA to LAD, SVG to Cfx, SVG to RCA (Dr. Prescott Gum)  . LOWER EXTREMITY ARTERIAL DOPPLER  09/2012   bilateral ABIS (mod arterial insuff at rest); R SFA 70-99% stenosis, L SFA 50-69% stenosis  . NM MYOCAR PERF WALL MOTION  12/2010   persnatine - moderate perfusion defect in apical anterior/apical/basal inferolateral/mid inferolateral/apical lateral walls - EF 65% - low risk  . RENAL ARTERY ANGIOPLASTY  02/11/2003   transluminal angioplasty & stent wtih 4x45mm Genesis balloon expandable stent premounted on Cordis aviatory balloon (Dr. Charlyne Petrin)  .  RENAL DOPPLER  09/2012   R prox renal artery @ stent equal/less that 60% diameter reduction; L renal artery 1-59% stenosis  . TRANSTHORACIC ECHOCARDIOGRAM  07/2012   EF 55-65%, mild conc LVH, grade 1 diastolic dysfunction; calcicifed MV annulus, mild MR; LA mild-mod dilated; RA mod dilated; mod TR; PA peak pressure 76mmHg, RVSP increased (pulm htn)    reports that she quit smoking about 18 years ago. She has a 30.00 pack-year smoking history. She has never used smokeless tobacco. She reports current alcohol use of about 2.0 - 3.0 standard drinks of alcohol per week. She reports that she does not use drugs. family history includes Deep vein thrombosis in her sister; Diabetes in her brother and sister; Heart disease in her sister; Hyperlipidemia in her sister and son; Hypertension in her mother, sister, and son; Other in her brother and mother. No Known Allergies    Outpatient Encounter Medications as of 04/19/2020  Medication Sig  . amLODipine (NORVASC) 10 MG tablet TAKE 1 TABLET BY MOUTH  DAILY  . apixaban (ELIQUIS) 5 MG TABS tablet Take 1 tablet (5 mg total) by mouth 2 (two) times daily.  . lansoprazole (PREVACID) 15 MG capsule Take 1 capsule (15 mg total) by mouth daily.  . metFORMIN (GLUCOPHAGE) 1000 MG tablet Take 1 tablet (1,000 mg total) by mouth 2 (two) times daily with a meal.  . methimazole (TAPAZOLE) 10 MG tablet Take 5 mg by mouth daily.  . metoprolol tartrate (LOPRESSOR) 25 MG tablet TAKE 1 TABLET(25 MG) BY MOUTH TWICE DAILY  . nitroGLYCERIN (NITROSTAT) 0.4 MG SL tablet Place 1 tablet (0.4 mg total) under the tongue every 5 (five) minutes as needed for chest pain.  . rosuvastatin (CRESTOR) 10 MG tablet TAKE 1 TABLET BY MOUTH  DAILY  . triamterene-hydrochlorothiazide (MAXZIDE-25) 37.5-25 MG tablet TAKE 1 TABLET BY MOUTH  DAILY  . valsartan (DIOVAN) 160 MG tablet TAKE 1 TABLET BY MOUTH  TWICE DAILY  . [DISCONTINUED] ketorolac (ACULAR) 0.5 % ophthalmic solution Place 2 drops into the  left eye 2 (two) times daily.  . [DISCONTINUED] Vitamin D, Ergocalciferol, (DRISDOL) 1.25 MG (50000 UNIT) CAPS capsule Take 1 capsule by mouth 2 (two) times a week.   No facility-administered encounter medications on file as of 04/19/2020.    REVIEW OF SYSTEMS  : All other systems reviewed and negative except where noted in the History of Present Illness.   PHYSICAL EXAM: BP 134/72   Pulse 88   Ht 5' 4.57" (1.64 m)   Wt 177 lb (80.3 kg)   BMI 29.85 kg/m  General: Well developed AA female in no acute distress Head: Normocephalic and atraumatic Eyes:  Sclerae anicteric, conjunctiva pink. Ears: Normal auditory acuity Lungs: Clear throughout to auscultation; no W/R/R. Heart: Irregularly irregular. Abdomen: Soft, non-distended.  BS present.  Non-tender. Musculoskeletal: Symmetrical with no gross deformities  Skin: No lesions on visible extremities Extremities: No  edema  Neurological: Alert oriented x 4, grossly non-focal Psychological:  Alert and cooperative. Normal mood and affect.  ASSESSMENT AND PLAN: *Heme positive stool:  Had a positive hemoccult card and a hemosure within a week of each other.  This is in the setting of Eliquis use. She was seen for the same issue in 2018 and colonoscopy revealed only internal hemorrhoids and 1 diminutive polyp. She is on a 5-year colonoscopy recall so we would typically recommend against any annual Hemoccult testing. She does not have any GI complaints. Denies black or bloody stools. She is not anemic. We discussed repeating colonoscopy plus/minus EGD versus observation for now. I think with the absence of symptoms and normal hemoglobin is very unlikely that she has something significant going on. The Hemoccult positive stools could certainly have been from hemorrhoids. After discussing this we collectively decided on observation for now with the understanding that we do not know for sure what caused this. Certainly if she shows evidence of bleeding  or we see a decline in hemoglobin then we would need to reconsider. Otherwise she is due for colonoscopy recall in January 2023.   CC:  Prince Solian, MD

## 2020-04-19 NOTE — Patient Instructions (Addendum)
If you are age 69 or older, your body mass index should be between 23-30. Your Body mass index is 29.85 kg/m. If this is out of the aforementioned range listed, please consider follow up with your Primary Care Provider.  If you are age 21 or younger, your body mass index should be between 19-25. Your Body mass index is 29.85 kg/m. If this is out of the aformentioned range listed, please consider follow up with your Primary Care Provider.   Follow up as needed.  Will receive recall letter for colonoscopy early 2023.

## 2020-04-21 ENCOUNTER — Other Ambulatory Visit: Payer: Self-pay | Admitting: Cardiovascular Disease

## 2020-05-06 DIAGNOSIS — E785 Hyperlipidemia, unspecified: Secondary | ICD-10-CM | POA: Diagnosis not present

## 2020-05-06 DIAGNOSIS — E0590 Thyrotoxicosis, unspecified without thyrotoxic crisis or storm: Secondary | ICD-10-CM | POA: Diagnosis not present

## 2020-05-06 DIAGNOSIS — I6523 Occlusion and stenosis of bilateral carotid arteries: Secondary | ICD-10-CM | POA: Diagnosis not present

## 2020-05-06 DIAGNOSIS — Z23 Encounter for immunization: Secondary | ICD-10-CM | POA: Diagnosis not present

## 2020-05-06 DIAGNOSIS — I129 Hypertensive chronic kidney disease with stage 1 through stage 4 chronic kidney disease, or unspecified chronic kidney disease: Secondary | ICD-10-CM | POA: Diagnosis not present

## 2020-05-06 DIAGNOSIS — E040 Nontoxic diffuse goiter: Secondary | ICD-10-CM | POA: Diagnosis not present

## 2020-05-06 DIAGNOSIS — E1159 Type 2 diabetes mellitus with other circulatory complications: Secondary | ICD-10-CM | POA: Diagnosis not present

## 2020-05-06 DIAGNOSIS — N1832 Chronic kidney disease, stage 3b: Secondary | ICD-10-CM | POA: Diagnosis not present

## 2020-05-06 DIAGNOSIS — C50919 Malignant neoplasm of unspecified site of unspecified female breast: Secondary | ICD-10-CM | POA: Diagnosis not present

## 2020-05-06 DIAGNOSIS — I25718 Atherosclerosis of autologous vein coronary artery bypass graft(s) with other forms of angina pectoris: Secondary | ICD-10-CM | POA: Diagnosis not present

## 2020-05-16 ENCOUNTER — Ambulatory Visit (INDEPENDENT_AMBULATORY_CARE_PROVIDER_SITE_OTHER): Payer: Medicare PPO | Admitting: Ophthalmology

## 2020-05-16 ENCOUNTER — Other Ambulatory Visit: Payer: Self-pay

## 2020-05-16 ENCOUNTER — Encounter (INDEPENDENT_AMBULATORY_CARE_PROVIDER_SITE_OTHER): Payer: Self-pay | Admitting: Ophthalmology

## 2020-05-16 DIAGNOSIS — E119 Type 2 diabetes mellitus without complications: Secondary | ICD-10-CM

## 2020-05-16 DIAGNOSIS — H35352 Cystoid macular degeneration, left eye: Secondary | ICD-10-CM | POA: Diagnosis not present

## 2020-05-16 DIAGNOSIS — H2511 Age-related nuclear cataract, right eye: Secondary | ICD-10-CM | POA: Diagnosis not present

## 2020-05-16 DIAGNOSIS — H59032 Cystoid macular edema following cataract surgery, left eye: Secondary | ICD-10-CM | POA: Diagnosis not present

## 2020-05-16 DIAGNOSIS — H35072 Retinal telangiectasis, left eye: Secondary | ICD-10-CM | POA: Insufficient documentation

## 2020-05-16 MED ORDER — KETOROLAC TROMETHAMINE 0.5 % OP SOLN: 1.0000 [drp] | Freq: Two times a day (BID) | OPHTHALMIC | 6 refills | Status: DC

## 2020-05-16 NOTE — Assessment & Plan Note (Signed)
Much improved in history of recurrence in the past, stabilized now for some years on topical NSAID twice daily OS

## 2020-05-16 NOTE — Progress Notes (Signed)
05/16/2020     CHIEF COMPLAINT Patient presents for Retina Follow Up   HISTORY OF PRESENT ILLNESS: [name] is a 69 y.o. female who presents to the clinic today for:   HPI    Retina Follow Up    Patient presents with  Diabetic Retinopathy.  In both eyes.  This started 1 year ago.  Duration of 1 year.  Since onset it is stable.          Comments    1 YR Diabetic F/U  Pt states vision stable, pt states "light outside has a tendency to bother my eyes", need refill of Ketorolac-hasn't used in 1 mo.  Last A1C: 5.7 on 05/06/2020  Last BS: 117 range        Last edited by Nichola Sizer D on 05/16/2020  2:57 PM. (History)      Referring physician: Prince Solian, MD [place],  [place] [id number]  HISTORICAL INFORMATION:   Selected notes from the MEDICAL RECORD NUMBER    Lab Results  Component Value Date   HGBA1C 7.8 (H) 11/30/2013     CURRENT MEDICATIONS: No current outpatient medications on file. (Ophthalmic Drugs)   No current facility-administered medications for this visit. (Ophthalmic Drugs)   Current Outpatient Medications (Other)  Medication Sig  . amLODipine (NORVASC) 10 MG tablet TAKE 1 TABLET BY MOUTH  DAILY  . apixaban (ELIQUIS) 5 MG TABS tablet Take 1 tablet (5 mg total) by mouth 2 (two) times daily.  . lansoprazole (PREVACID) 15 MG capsule Take 1 capsule (15 mg total) by mouth daily.  . metFORMIN (GLUCOPHAGE) 1000 MG tablet Take 1 tablet (1,000 mg total) by mouth 2 (two) times daily with a meal.  . methimazole (TAPAZOLE) 10 MG tablet Take 5 mg by mouth daily.  . metoprolol tartrate (LOPRESSOR) 25 MG tablet TAKE 1 TABLET(25 MG) BY MOUTH TWICE DAILY  . nitroGLYCERIN (NITROSTAT) 0.4 MG SL tablet Place 1 tablet (0.4 mg total) under the tongue every 5 (five) minutes as needed for chest pain.  . rosuvastatin (CRESTOR) 10 MG tablet TAKE 1 TABLET BY MOUTH  DAILY  . triamterene-hydrochlorothiazide (MAXZIDE-25) 37.5-25 MG tablet TAKE 1  TABLET BY MOUTH  DAILY  . valsartan (DIOVAN) 160 MG tablet TAKE 1 TABLET BY MOUTH  TWICE DAILY   No current facility-administered medications for this visit. (Other)      REVIEW OF SYSTEMS:    ALLERGIES No Known Allergies  PAST MEDICAL HISTORY Past Medical History:  Diagnosis Date  . Acute kidney failure ([place])   . ASHD (arteriosclerotic heart disease)    native vessel  . Atrial fibrillation ([facility])   . Breast cancer, left ([facility])   . CAD (coronary artery disease) 2004   multivessel, status post CABG x3  . Carotid artery disease ([facility])    stent in 2013  . CKD (chronic kidney disease), stage III ([facility])   . Diabetes ([place])   . GERD (gastroesophageal reflux disease)   . History of tobacco abuse   . Hx of adenomatous polyp of colon 07/26/2016  . Hyperlipidemia   . Hypertension   . Hyperthyroidism   . Myocardial infarction ([facility]) 2002  . Peripheral vascular disease ([facility])    S/P popliteal artery and renal artery stents  . Renovascular hypertension   . S/P CABG x 3 2004  . Thyroid goiter   . Tobacco abuse   . Vaginal cancer ([facility]) 1983   Past Surgical History:  Procedure Laterality Date  . BREAST LUMPECTOMY  WITH RADIOACTIVE SEED AND SENTINEL LYMPH NODE BIOPSY Left 08/08/2017   Procedure: LEFT BREAST LUMPECTOMY WITH RADIOACTIVE SEED AND SENTINEL LYMPH NODE BIOPSY;  Surgeon: Jovita Kussmaul, MD;  Location: [facility];  Service: General;  Laterality: Left;  . CARDIAC CATHETERIZATION  09/23/2002   3 vessel CAD >> CABG (Dr. Marella Chimes)  . CARDIAC CATHETERIZATION  12/23/2003   patent LIMA, LAD, SVG to OM1, SVG to RCA, distal disease beyond graft insertion (Dr. Marella Chimes)  . CARDIOVERSION  09/11/2011   Procedure: CARDIOVERSION;  Surgeon: Sanda Klein, MD;  Location: [facility];  Service: Cardiovascular;  Laterality: N/A;  . CAROTID ANGIOGRAM Bilateral 02/12/2012   Procedure: CAROTID ANGIOGRAM;  Surgeon: Serafina Mitchell, MD;  Location: [facility] CATH LAB;  Service: Cardiovascular;   Laterality: Bilateral;  . CAROTID DOPPLER  03/2013   right bulb & prox ICA w/50-69% stenosis, L ICA stent open/patent  . CAROTID STENT  02/12/2012   Carotid Stent Insertion, Carotid Angiogram - 9x40 Cordis Precise stent  (Dr. Pierre Bali)  . CAROTID STENT INSERTION N/A 02/12/2012   Procedure: CAROTID STENT INSERTION;  Surgeon: Serafina Mitchell, MD;  Location: [facility] CATH LAB;  Service: Cardiovascular;  Laterality: N/A;  . CORONARY ARTERY BYPASS GRAFT  09/24/2002   LIMA to LAD, SVG to Cfx, SVG to RCA (Dr. Prescott Gum)  . LOWER EXTREMITY ARTERIAL DOPPLER  09/2012   bilateral ABIS (mod arterial insuff at rest); R SFA 70-99% stenosis, L SFA 50-69% stenosis  . NM MYOCAR PERF WALL MOTION  12/2010   persnatine - moderate perfusion defect in apical anterior/apical/basal inferolateral/mid inferolateral/apical lateral walls - EF 65% - low risk  . RENAL ARTERY ANGIOPLASTY  02/11/2003   transluminal angioplasty & stent wtih 4x57mm Genesis balloon expandable stent premounted on Cordis aviatory balloon (Dr. Charlyne Petrin)  . RENAL DOPPLER  09/2012   R prox renal artery @ stent equal/less that 60% diameter reduction; L renal artery 1-59% stenosis  . TRANSTHORACIC ECHOCARDIOGRAM  07/2012   EF 55-65%, mild conc LVH, grade 1 diastolic dysfunction; calcicifed MV annulus, mild MR; LA mild-mod dilated; RA mod dilated; mod TR; PA peak pressure 48mmHg, RVSP increased (pulm htn)    FAMILY HISTORY Family History  Problem Relation Age of Onset  . Hypertension Mother   . Other Mother        DJD  . Deep vein thrombosis Sister   . Diabetes Sister   . Heart disease Sister   . Hyperlipidemia Sister   . Hypertension Sister   . Diabetes Brother   . Other Brother        DJD  . Hypertension Son   . Hyperlipidemia Son   . Colon cancer Neg Hx   . Stomach cancer Neg Hx   . Esophageal cancer Neg Hx   . Rectal cancer Neg Hx   . Liver cancer Neg Hx     SOCIAL HISTORY Social History   Tobacco Use  . Smoking status: Former Smoker     Packs/day: 1.00    Years: 30.00    Pack years: 30.00    Quit date: 07/02/2001    Years since quitting: 18.8  . Smokeless tobacco: Never Used  Vaping Use  . Vaping Use: Never used  Substance Use Topics  . Alcohol use: Yes    Alcohol/week: 2.0 - 3.0 standard drinks    Types: 2 - 3 Standard drinks or equivalent per week    Comment: socially  . Drug use: No  OPHTHALMIC EXAM: Base Eye Exam    Visual Acuity (ETDRS)      Right Left   Dist [place] 20/50 +2 20/80 +2   Dist ph [place] 20/40 +2 20/40 -2       Tonometry (Tonopen, 2:59 PM)      Right Left   Pressure 18 21       Pupils      Dark Light Shape React APD   Right 5 4 Round Brisk None   Left 5 4 Round Brisk None       Visual Fields (Counting fingers)      Left Right    Full Full       Extraocular Movement      Right Left    Full Full       Neuro/Psych    Oriented x3: Yes       Dilation    Both eyes: 1.0% Mydriacyl, 2.5% Phenylephrine @ 3:01 PM        Slit Lamp and Fundus Exam    External Exam      Right Left   External Normal Normal       Slit Lamp Exam      Right Left   Lids/Lashes Normal Normal   Conjunctiva/Sclera White and quiet White and quiet   Cornea Clear Clear   Anterior Chamber Deep and quiet Deep and quiet   Iris Round and reactive Round and reactive   Lens 2+ Nuclear sclerosis Centered posterior chamber intraocular lens   Anterior Vitreous Normal Normal       Fundus Exam      Right Left   Posterior Vitreous Posterior vitreous detachment Normal   Disc Normal Normal   C/D Ratio 0.6 0.6   Macula Normal No clinically detectable CME   Vessels no DR no DR   Periphery Normal Normal          IMAGING AND PROCEDURES  Imaging and Procedures for 05/16/20           ASSESSMENT/PLAN:  Cystoid macular edema of left eye Much improved in history of recurrence in the past, stabilized now for some years on topical NSAID twice daily OS  Diabetes mellitus, Type 2 NIDDM No signs  of diabetic retinopathy      ICD-10-CM   1. Cystoid macular edema of left eye following cataract surgery  H59.032   2. Cystoid macular edema of left eye  H35.352   3. Retinal telangiectasia of left eye  H35.072   4. Type 2 diabetes mellitus without complication, without long-term current use of insulin (HCC)  E11.9   5. Nuclear sclerotic cataract of right eye  H25.11     1.  OS needs chronic suppressive NSAID twice daily to prevent recurrence of CME 2.  Moderate nuclear sclerotic cataract, patient has option to proceed with cataract surgery anytime  3.  No detectable diabetic eye disease complication OU  Ophthalmic Meds Ordered this visit:  No orders of the defined types were placed in this encounter.      Return in about 1 year (around 05/16/2021) for DILATE OU, OCT.  There are no Patient Instructions on file for this visit.   Explained the diagnoses, plan, and follow up with the patient and they expressed understanding.  Patient expressed understanding of the importance of proper follow up care.   Clent Demark Lenka Zhao M.D. Diseases & Surgery of the Retina and Vitreous [facility] Diabetic [facility] 05/16/20     Abbreviations: M myopia (nearsighted);  A astigmatism; H hyperopia (farsighted); P presbyopia; Mrx spectacle prescription;  CTL contact lenses; OD right eye; OS left eye; OU both eyes  XT exotropia; ET esotropia; PEK punctate epithelial keratitis; PEE punctate epithelial erosions; DES dry eye syndrome; MGD meibomian gland dysfunction; ATs artificial tears; PFAT's preservative free artificial tears; [place] nuclear sclerotic cataract; PSC posterior subcapsular cataract; ERM epi-retinal membrane; PVD posterior vitreous detachment; RD retinal detachment; DM diabetes mellitus; DR diabetic retinopathy; NPDR non-proliferative diabetic retinopathy; PDR proliferative diabetic retinopathy; CSME clinically significant macular edema; DME diabetic macular edema; dbh dot blot hemorrhages; CWS  cotton wool spot; POAG primary open angle glaucoma; C/D cup-to-disc ratio; HVF humphrey visual field; GVF goldmann visual field; OCT optical coherence tomography; IOP intraocular pressure; BRVO Branch retinal vein occlusion; CRVO central retinal vein occlusion; CRAO central retinal artery occlusion; BRAO branch retinal artery occlusion; RT retinal tear; SB scleral buckle; PPV pars plana vitrectomy; VH Vitreous hemorrhage; PRP panretinal laser photocoagulation; IVK intravitreal kenalog; VMT vitreomacular traction; MH Macular hole;  NVD neovascularization of the disc; NVE neovascularization elsewhere; AREDS age related eye disease study; ARMD age related macular degeneration; POAG primary open angle glaucoma; EBMD epithelial/anterior basement membrane dystrophy; ACIOL anterior chamber intraocular lens; IOL intraocular lens; PCIOL posterior chamber intraocular lens; Phaco/IOL phacoemulsification with intraocular lens placement; [place] photorefractive keratectomy; LASIK laser assisted in situ keratomileusis; HTN hypertension; DM diabetes mellitus; COPD chronic obstructive pulmonary disease

## 2020-05-16 NOTE — Assessment & Plan Note (Signed)
No signs of diabetic retinopathy

## 2020-05-30 ENCOUNTER — Other Ambulatory Visit: Payer: Self-pay | Admitting: Cardiovascular Disease

## 2020-07-07 ENCOUNTER — Other Ambulatory Visit: Payer: Self-pay | Admitting: Cardiovascular Disease

## 2020-08-10 ENCOUNTER — Other Ambulatory Visit: Payer: Self-pay | Admitting: Cardiovascular Disease

## 2020-08-10 ENCOUNTER — Ambulatory Visit (HOSPITAL_COMMUNITY)
Admission: RE | Admit: 2020-08-10 | Discharge: 2020-08-10 | Disposition: A | Discharge status: Home / Self Care | Payer: Medicare PPO | Source: Ambulatory Visit | Attending: Cardiovascular Disease | Admitting: Cardiovascular Disease

## 2020-08-10 ENCOUNTER — Other Ambulatory Visit: Payer: Self-pay

## 2020-08-10 DIAGNOSIS — I6523 Occlusion and stenosis of bilateral carotid arteries: Secondary | ICD-10-CM | POA: Insufficient documentation

## 2020-08-22 ENCOUNTER — Other Ambulatory Visit: Payer: Self-pay | Admitting: Cardiovascular Disease

## 2020-09-15 DIAGNOSIS — E1122 Type 2 diabetes mellitus with diabetic chronic kidney disease: Secondary | ICD-10-CM | POA: Diagnosis not present

## 2020-09-15 DIAGNOSIS — I129 Hypertensive chronic kidney disease with stage 1 through stage 4 chronic kidney disease, or unspecified chronic kidney disease: Secondary | ICD-10-CM | POA: Diagnosis not present

## 2020-09-15 DIAGNOSIS — N1832 Chronic kidney disease, stage 3b: Secondary | ICD-10-CM | POA: Diagnosis not present

## 2020-09-15 DIAGNOSIS — Z8679 Personal history of other diseases of the circulatory system: Secondary | ICD-10-CM | POA: Diagnosis not present

## 2020-09-15 DIAGNOSIS — Z951 Presence of aortocoronary bypass graft: Secondary | ICD-10-CM | POA: Diagnosis not present

## 2020-09-20 DIAGNOSIS — I25718 Atherosclerosis of autologous vein coronary artery bypass graft(s) with other forms of angina pectoris: Secondary | ICD-10-CM | POA: Diagnosis not present

## 2020-09-20 DIAGNOSIS — I480 Paroxysmal atrial fibrillation: Secondary | ICD-10-CM | POA: Diagnosis not present

## 2020-09-20 DIAGNOSIS — N1832 Chronic kidney disease, stage 3b: Secondary | ICD-10-CM | POA: Diagnosis not present

## 2020-09-20 DIAGNOSIS — E1159 Type 2 diabetes mellitus with other circulatory complications: Secondary | ICD-10-CM | POA: Diagnosis not present

## 2020-09-20 DIAGNOSIS — D509 Iron deficiency anemia, unspecified: Secondary | ICD-10-CM | POA: Diagnosis not present

## 2020-09-20 DIAGNOSIS — I6523 Occlusion and stenosis of bilateral carotid arteries: Secondary | ICD-10-CM | POA: Diagnosis not present

## 2020-09-20 DIAGNOSIS — C50919 Malignant neoplasm of unspecified site of unspecified female breast: Secondary | ICD-10-CM | POA: Diagnosis not present

## 2020-09-20 DIAGNOSIS — E0590 Thyrotoxicosis, unspecified without thyrotoxic crisis or storm: Secondary | ICD-10-CM | POA: Diagnosis not present

## 2020-09-20 DIAGNOSIS — I129 Hypertensive chronic kidney disease with stage 1 through stage 4 chronic kidney disease, or unspecified chronic kidney disease: Secondary | ICD-10-CM | POA: Diagnosis not present

## 2020-12-14 ENCOUNTER — Other Ambulatory Visit: Payer: Self-pay | Admitting: Cardiovascular Disease

## 2021-01-30 DIAGNOSIS — E1159 Type 2 diabetes mellitus with other circulatory complications: Secondary | ICD-10-CM | POA: Diagnosis not present

## 2021-01-30 DIAGNOSIS — E040 Nontoxic diffuse goiter: Secondary | ICD-10-CM | POA: Diagnosis not present

## 2021-01-30 DIAGNOSIS — E785 Hyperlipidemia, unspecified: Secondary | ICD-10-CM | POA: Diagnosis not present

## 2021-02-06 DIAGNOSIS — Z0000 Encounter for general adult medical examination without abnormal findings: Secondary | ICD-10-CM | POA: Diagnosis not present

## 2021-02-06 DIAGNOSIS — D509 Iron deficiency anemia, unspecified: Secondary | ICD-10-CM | POA: Diagnosis not present

## 2021-02-06 DIAGNOSIS — E785 Hyperlipidemia, unspecified: Secondary | ICD-10-CM | POA: Diagnosis not present

## 2021-02-06 DIAGNOSIS — E1159 Type 2 diabetes mellitus with other circulatory complications: Secondary | ICD-10-CM | POA: Diagnosis not present

## 2021-02-06 DIAGNOSIS — C50919 Malignant neoplasm of unspecified site of unspecified female breast: Secondary | ICD-10-CM | POA: Diagnosis not present

## 2021-02-06 DIAGNOSIS — R82998 Other abnormal findings in urine: Secondary | ICD-10-CM | POA: Diagnosis not present

## 2021-02-06 DIAGNOSIS — E040 Nontoxic diffuse goiter: Secondary | ICD-10-CM | POA: Diagnosis not present

## 2021-02-06 DIAGNOSIS — I129 Hypertensive chronic kidney disease with stage 1 through stage 4 chronic kidney disease, or unspecified chronic kidney disease: Secondary | ICD-10-CM | POA: Diagnosis not present

## 2021-02-06 DIAGNOSIS — E0590 Thyrotoxicosis, unspecified without thyrotoxic crisis or storm: Secondary | ICD-10-CM | POA: Diagnosis not present

## 2021-02-06 DIAGNOSIS — N1832 Chronic kidney disease, stage 3b: Secondary | ICD-10-CM | POA: Diagnosis not present

## 2021-02-15 ENCOUNTER — Other Ambulatory Visit: Payer: Self-pay | Admitting: Internal Medicine

## 2021-02-15 DIAGNOSIS — Z853 Personal history of malignant neoplasm of breast: Secondary | ICD-10-CM

## 2021-02-23 ENCOUNTER — Telehealth: Payer: Self-pay | Admitting: Cardiovascular Disease

## 2021-02-23 NOTE — Telephone Encounter (Signed)
Spoke with patient and advised that unsure if paperwork is received/completed but would send a message to MD/RN who will be back in the office on Monday 8/29 and can follow up then. She needs the paperwork by 03/01/21

## 2021-02-23 NOTE — Telephone Encounter (Signed)
Pt states she dropped off some paperwork to get her handicap placard on 02/20/21, pt wants to know if the paperwork is completed.

## 2021-02-28 NOTE — Telephone Encounter (Signed)
Patient's husband is following up on the status of the paperwork. Please call with updates.

## 2021-02-28 NOTE — Telephone Encounter (Signed)
Left a message that the paperwork would be available for pick up tomorrow, Wednesday.

## 2021-03-01 ENCOUNTER — Other Ambulatory Visit: Payer: Self-pay

## 2021-03-01 ENCOUNTER — Ambulatory Visit
Admission: RE | Admit: 2021-03-01 | Discharge: 2021-03-01 | Disposition: A | Discharge status: Home / Self Care | Payer: Medicare PPO | Source: Ambulatory Visit | Attending: Internal Medicine | Admitting: Internal Medicine

## 2021-03-01 DIAGNOSIS — R922 Inconclusive mammogram: Secondary | ICD-10-CM | POA: Diagnosis not present

## 2021-03-01 DIAGNOSIS — Z853 Personal history of malignant neoplasm of breast: Secondary | ICD-10-CM

## 2021-03-01 NOTE — Telephone Encounter (Signed)
Attempted to call patients husband unable to reach him. Left message that paperwork is signed and at front desk for pick up.   Called and spoke with patient, advised her that paper work has been signed and placed at front desk for pick up. Advised her to call back with any issues, questions, or concerns. Patient verbalized understanding.

## 2021-03-01 NOTE — Telephone Encounter (Signed)
[name] is calling to confirm that [name] handicap sticker is ready for pick up. He is requesting a callback ASAP. Please advise.

## 2021-03-23 DIAGNOSIS — Z951 Presence of aortocoronary bypass graft: Secondary | ICD-10-CM | POA: Diagnosis not present

## 2021-03-23 DIAGNOSIS — E1122 Type 2 diabetes mellitus with diabetic chronic kidney disease: Secondary | ICD-10-CM | POA: Diagnosis not present

## 2021-03-23 DIAGNOSIS — Z8679 Personal history of other diseases of the circulatory system: Secondary | ICD-10-CM | POA: Diagnosis not present

## 2021-03-23 DIAGNOSIS — I129 Hypertensive chronic kidney disease with stage 1 through stage 4 chronic kidney disease, or unspecified chronic kidney disease: Secondary | ICD-10-CM | POA: Diagnosis not present

## 2021-03-23 DIAGNOSIS — N1832 Chronic kidney disease, stage 3b: Secondary | ICD-10-CM | POA: Diagnosis not present

## 2021-03-23 DIAGNOSIS — Z23 Encounter for immunization: Secondary | ICD-10-CM | POA: Diagnosis not present

## 2021-03-24 ENCOUNTER — Other Ambulatory Visit: Payer: Self-pay | Admitting: Adult Health

## 2021-03-24 NOTE — Telephone Encounter (Signed)
Prescription refill request for Eliquis received. Indication:atrial fib Last office visit:upcoming Scr:1.4 Age: 70 Weight:80.3 kg  Prescription refilled

## 2021-03-31 ENCOUNTER — Other Ambulatory Visit: Payer: Self-pay

## 2021-03-31 MED ORDER — ROSUVASTATIN CALCIUM 10 MG PO TABS: 10.0000 mg | ORAL_TABLET | Freq: Every day | ORAL | 4 refills | Status: DC

## 2021-03-31 NOTE — Telephone Encounter (Signed)
This is Dr. Croitoru's pt 

## 2021-04-07 ENCOUNTER — Telehealth: Payer: Self-pay

## 2021-04-07 NOTE — Telephone Encounter (Signed)
Spoke to pt she wanted to know if we received her paperwork to re-enroll in transportation services. After speaking with Lattie Haw she states she will get the paperwork to Dr. Sallyanne Kuster. Called pt back to give her an update.

## 2021-04-07 NOTE — Telephone Encounter (Signed)
Attempted to reach the patient. Was unable to leave a message. Paperwork has been left up front for her.

## 2021-05-16 ENCOUNTER — Ambulatory Visit (INDEPENDENT_AMBULATORY_CARE_PROVIDER_SITE_OTHER): Payer: Medicare PPO | Admitting: Ophthalmology

## 2021-05-16 ENCOUNTER — Encounter (INDEPENDENT_AMBULATORY_CARE_PROVIDER_SITE_OTHER): Payer: Self-pay | Admitting: Ophthalmology

## 2021-05-16 ENCOUNTER — Other Ambulatory Visit: Payer: Self-pay

## 2021-05-16 DIAGNOSIS — H2511 Age-related nuclear cataract, right eye: Secondary | ICD-10-CM

## 2021-05-16 DIAGNOSIS — H59032 Cystoid macular edema following cataract surgery, left eye: Secondary | ICD-10-CM

## 2021-05-16 NOTE — Assessment & Plan Note (Signed)
Controlled in the past with topical NSAIDs, ketorolac 4 times daily now on chronic suppressive therapy 1 drop left eye twice daily because of previous recurrence.  No recurrence while using twice daily therapy

## 2021-05-16 NOTE — Assessment & Plan Note (Signed)
OD, with progressive NSC changes, with little impact on acuity at this time, follow-up with Dr. Baldemar Lenis as scheduled

## 2021-05-16 NOTE — Assessment & Plan Note (Signed)
No detectable diabetic retinopathy 

## 2021-05-16 NOTE — Progress Notes (Signed)
05/16/2021     CHIEF COMPLAINT Patient presents for  Chief Complaint  Patient presents with   Retina Follow Up      HISTORY OF PRESENT ILLNESS: [name] is a 70 y.o. female who presents to the clinic today for:   HPI     Retina Follow Up   Patient presents with  Other.  In both eyes.  This started 1 year ago.  Duration of 1 year.  Since onset it is stable.        Comments   1 year f/u OU with OCT  EyeMeds: Ketorolac BID OS      Last edited by Reather Littler, COA on 05/16/2021  1:10 PM.      Referring physician: Monna Fam, MD [place],  [place] [address]  HISTORICAL INFORMATION:   Selected notes from the MEDICAL RECORD NUMBER    Lab Results  Component Value Date   HGBA1C 7.8 (H) 11/30/2013     CURRENT MEDICATIONS: Current Outpatient Medications (Ophthalmic Drugs)  Medication Sig   ketorolac (ACULAR) 0.5 % ophthalmic solution Place 1 drop into the left eye 2 (two) times daily.   No current facility-administered medications for this visit. (Ophthalmic Drugs)   Current Outpatient Medications (Other)  Medication Sig   amLODipine (NORVASC) 10 MG tablet TAKE 1 TABLET BY MOUTH  DAILY   ELIQUIS 5 MG TABS tablet TAKE 1 TABLET(5 MG) BY MOUTH TWICE DAILY   lansoprazole (PREVACID) 15 MG capsule Take 1 capsule (15 mg total) by mouth daily.   metFORMIN (GLUCOPHAGE) 1000 MG tablet Take 1 tablet (1,000 mg total) by mouth 2 (two) times daily with a meal.   methimazole (TAPAZOLE) 10 MG tablet Take 5 mg by mouth daily.   metoprolol tartrate (LOPRESSOR) 25 MG tablet TAKE 1 TABLET(25 MG) BY MOUTH TWICE DAILY   nitroGLYCERIN (NITROSTAT) 0.4 MG SL tablet Place 1 tablet (0.4 mg total) under the tongue every 5 (five) minutes as needed for chest pain.   rosuvastatin (CRESTOR) 10 MG tablet Take 1 tablet (10 mg total) by mouth daily.   triamterene-hydrochlorothiazide (MAXZIDE-25) 37.5-25 MG tablet TAKE 1 TABLET BY MOUTH  DAILY   valsartan  (DIOVAN) 160 MG tablet TAKE 1 TABLET BY MOUTH  TWICE DAILY   No current facility-administered medications for this visit. (Other)      REVIEW OF SYSTEMS:    ALLERGIES No Known Allergies  PAST MEDICAL HISTORY Past Medical History:  Diagnosis Date   Acute kidney failure ([place])    ASHD (arteriosclerotic heart disease)    native vessel   Atrial fibrillation (HCC)    Breast cancer, left (HCC)    CAD (coronary artery disease) 2004   multivessel, status post CABG x3   Carotid artery disease ([place])    stent in 2013   CKD (chronic kidney disease), stage III (HCC)    Diabetes (HCC)    GERD (gastroesophageal reflux disease)    History of tobacco abuse    Hx of adenomatous polyp of colon 07/26/2016   Hyperlipidemia    Hypertension    Hyperthyroidism    Myocardial infarction [facility]) 2002   Peripheral vascular disease ([place])    S/P popliteal artery and renal artery stents   Renovascular hypertension    S/P CABG x 3 2004   Thyroid goiter    Tobacco abuse    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 Marella Chimes)   CARDIAC CATHETERIZATION  12/23/2003   patent LIMA, LAD, SVG to OM1, SVG to RCA, distal disease beyond graft insertion (Dr. Marella Chimes)   CARDIOVERSION  09/11/2011   Procedure: CARDIOVERSION;  Surgeon: Sanda Klein, MD;  Location: [place];  Service: Cardiovascular;  Laterality: N/A;   CAROTID ANGIOGRAM Bilateral 02/12/2012   Procedure: CAROTID Cyril Loosen;  Surgeon: Serafina Mitchell, MD;  Location: [facility] CATH LAB;  Service: Cardiovascular;  Laterality: Bilateral;   CAROTID DOPPLER  03/2013   right bulb &  prox ICA w/50-69% stenosis, L ICA stent open/patent   CAROTID STENT  02/12/2012   Carotid Stent Insertion, Carotid Angiogram - 9x40 Cordis Precise stent  (Dr. Pierre Bali)   CAROTID STENT INSERTION N/A 02/12/2012   Procedure: CAROTID STENT INSERTION;  Surgeon: Serafina Mitchell, MD;  Location[facility] C[facility] LAB;  Service: Cardiovascular;  Laterality: N/A;   CORONARY ARTERY BYPASS GRAFT  09/24/2002   LIMA to LAD, SVG to Cfx, SVG to RCA (Dr. Prescott Gum)   LOWER EXTREMITY ARTERIAL DOPPLER  09/2012   bilateral ABIS (mod arterial insuff at rest); R SFA 70-99% stenosis, L SFA 50-69% stenosis   NM MYOCAR PERF WALL MOTION  12/2010   persnatine - moderate perfusion defect in apical anterior/apical/basal inferolateral/mid inferolateral/apical lateral walls - EF 65% - low risk   RENAL ARTERY ANGIOPLASTY  02/11/2003   transluminal angioplasty & stent wtih 4x2mm Genesis balloon expandable stent premounted on Cordis aviatory balloon (Dr. Charlyne Petrin)   RENAL DOPPLER  09/2012   R prox renal artery @ stent equal/less that 60% diameter reduction; L renal artery 1-59% stenosis   TRANSTHORACIC ECHOCARDIOGRAM  07/2012   EF 55-65%, mild conc LVH, grade 1 diastolic dysfunction; calcicifed MV annulus, mild MR; LA mild-mod dilated; RA mod dilated; mod TR; PA peak pressure 15mmHg, RVSP increased (pulm htn)    FAMILY HISTORY Family History  Problem Relation Age of Onset   Hypertension Mother    Other Mother        DJD   Deep vein thrombosis Sister    Diabetes Sister    Heart disease Sister    Hyperlipidemia Sister    Hypertension Sister    Diabetes Brother    Other Brother        DJD   Hypertension Son    Hyperlipidemia Son    Colon cancer Neg Hx    Stomach cancer Neg Hx    Esophageal cancer Neg Hx    Rectal cancer Neg Hx    Liver cancer Neg Hx     SOCIAL HISTORY Social History   Tobacco Use   Smoking status: Former    Packs/day: 1.00    Years: 30.00    Pack years: 30.00    Types: Cigarettes    Quit date:  07/02/2001    Years since quitting: 19.8   Smokeless tobacco: Never  Vaping Use   Vaping Use: Never used  Substance Use Topics   Alcohol use: Yes    Alcohol/week: 2.0 - 3.0 standard drinks    Types: 2 - 3 Standard drinks or equivalent per week    Comment: socially   Drug use: No  OPHTHALMIC EXAM:  Base Eye Exam     Visual Acuity (ETDRS)       Right Left   Dist [place] 20/25 -2 20/63 -1   Dist ph [place]  20/32 -1         Tonometry (Tonopen, 1:21 PM)       Right Left   Pressure 14 15         Pupils       Pupils Dark Light Shape React APD   Right PERRL 6 4 Round Brisk None   Left PERRL 6 5 Round Brisk +1         Visual Fields (Counting fingers)       Left Right    Full Full         Extraocular Movement       Right Left    Full, Ortho Full, Ortho         Neuro/Psych     Oriented x3: Yes   Mood/Affect: Normal         Dilation     Both eyes: 1.0% Mydriacyl, 2.5% Phenylephrine @ 1:19 PM           Slit Lamp and Fundus Exam     External Exam       Right Left   External Normal Normal         Slit Lamp Exam       Right Left   Lids/Lashes Normal Normal   Conjunctiva/Sclera White and quiet White and quiet   Cornea Clear Clear   Anterior Chamber Deep and quiet Deep and quiet   Iris Round and reactive Round and reactive   Lens 2+ Nuclear sclerosis Centered posterior chamber intraocular lens   Anterior Vitreous Normal Normal         Fundus Exam       Right Left   Posterior Vitreous Posterior vitreous detachment Normal   Disc Normal Normal   C/D Ratio 0.6 0.6   Macula Normal No clinically detectable CME   Vessels no DR no DR   Periphery Normal Normal            IMAGING AND PROCEDURES  Imaging and Procedures for 05/16/21  OCT, Retina - OU - Both Eyes       Right Eye Quality was good. Scan locations included subfoveal. Central Foveal Thickness: 295. Progression has been stable. Findings include normal foveal  contour.   Left Eye Quality was good. Scan locations included subfoveal. Central Foveal Thickness: 336. Progression has been stable. Findings include abnormal foveal contour.   Notes No active CME OU, will continue to treat left eye with chronic suppressive NSAID twice daily             ASSESSMENT/PLAN:  Nuclear sclerotic cataract of right eye OD, with progressive NSC changes, with little impact on acuity at this time, follow-up with Dr. 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  No recurrence while using twice daily therapy  Diabetes mellitus, Type 2 NIDDM No detectable diabetic retinopathy     ICD-10-CM   1. 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 Nuclear sclerotic cataract of right eye  H25.11       1.  Mild to  moderate nuclear sclerotic cataract though progression in the right eye is present, still with 20/25 vision and no complaints at this time  2.  OS with history of CME and that recurrence off of NSAID therapy.  Now on chronic suppressive twice daily topical ketorolac, no recurrences of occurred with preservation of good acuity  3.  No detectable diabetic retinopathy OU  Ophthalmic Meds Ordered this visit:  No orders of the defined types were placed in this encounter.      Return in about 1 year (around 05/16/2022) for DILATE OU, COLOR FP, OCT.  There are no Patient Instructions on file for this visit.   Explained the diagnoses, plan, and follow up with the patient and they expressed understanding.  Patient expressed understanding of the importance of proper follow up care.   Clent Demark Raychel Dowler M.D. Diseases & Surgery of the Retina and Vitreous Retina & Diabetic [place] 05/16/21     Abbreviations: M myopia (nearsighted); A astigmatism; H hyperopia (farsighted); P presbyopia; Mrx spectacle prescription;  CTL contact lenses; OD right eye; OS left eye; OU both eyes  XT exotropia; ET esotropia; PEK punctate epithelial keratitis; PEE punctate epithelial erosions; DES dry eye syndrome; MGD meibomian gland dysfunction; ATs artificial tears; PFAT's preservative free artificial tears; [place] nuclear sclerotic cataract; PSC posterior subcapsular cataract; ERM epi-retinal membrane; PVD posterior vitreous detachment; RD retinal detachment; DM diabetes mellitus; DR diabetic retinopathy; NPDR non-proliferative diabetic retinopathy; PDR proliferative diabetic retinopathy; CSME clinically significant macular edema; DME diabetic macular edema; dbh dot blot hemorrhages; CWS cotton wool spot; POAG primary open angle glaucoma; C/D cup-to-disc ratio; HVF humphrey visual field; GVF goldmann visual field; OCT optical coherence tomography; IOP intraocular pressure; BRVO Branch retinal vein occlusion; CRVO central retinal vein occlusion; CRAO central retinal artery occlusion; BRAO branch retinal artery occlusion; RT retinal tear; SB scleral buckle; PPV pars plana vitrectomy; VH Vitreous hemorrhage; PRP panretinal laser photocoagulation; IVK intravitreal kenalog; VMT vitreomacular traction; MH Macular hole;  NVD neovascularization of the disc; NVE neovascularization elsewhere; AREDS age related eye disease study; ARMD age related macular degeneration; POAG primary open angle glaucoma; EBMD epithelial/anterior basement membrane dystrophy; ACIOL anterior chamber intraocular lens; IOL intraocular lens; PCIOL posterior chamber intraocular lens; Phaco/IOL phacoemulsification with intraocular lens placement; [place] photorefractive keratectomy; LASIK laser assisted in situ keratomileusis; HTN hypertension; DM diabetes mellitus; COPD chronic obstructive pulmonary disease

## 2021-05-18 ENCOUNTER — Encounter: Payer: Self-pay | Admitting: Cardiovascular Disease

## 2021-05-18 ENCOUNTER — Ambulatory Visit: Payer: Medicare PPO | Admitting: Cardiovascular Disease

## 2021-05-18 ENCOUNTER — Other Ambulatory Visit: Payer: Self-pay

## 2021-05-18 VITALS — BP 152/76 | HR 71 | Ht 65.0 in | Wt 177.0 lb

## 2021-05-18 DIAGNOSIS — Z7901 Long term (current) use of anticoagulants: Secondary | ICD-10-CM

## 2021-05-18 DIAGNOSIS — I739 Peripheral vascular disease, unspecified: Secondary | ICD-10-CM | POA: Diagnosis not present

## 2021-05-18 DIAGNOSIS — I10 Essential (primary) hypertension: Secondary | ICD-10-CM

## 2021-05-18 DIAGNOSIS — N1832 Chronic kidney disease, stage 3b: Secondary | ICD-10-CM | POA: Diagnosis not present

## 2021-05-18 DIAGNOSIS — E1151 Type 2 diabetes mellitus with diabetic peripheral angiopathy without gangrene: Secondary | ICD-10-CM | POA: Diagnosis not present

## 2021-05-18 DIAGNOSIS — I25718 Atherosclerosis of autologous vein coronary artery bypass graft(s) with other forms of angina pectoris: Secondary | ICD-10-CM

## 2021-05-18 DIAGNOSIS — I6523 Occlusion and stenosis of bilateral carotid arteries: Secondary | ICD-10-CM

## 2021-05-18 DIAGNOSIS — E785 Hyperlipidemia, unspecified: Secondary | ICD-10-CM

## 2021-05-18 DIAGNOSIS — I701 Atherosclerosis of renal artery: Secondary | ICD-10-CM

## 2021-05-18 DIAGNOSIS — I4811 Longstanding persistent atrial fibrillation: Secondary | ICD-10-CM | POA: Diagnosis not present

## 2021-05-18 NOTE — Progress Notes (Signed)
Cardiology Office Note    Evaluation Performed:  Follow-up visit  Date:  05/18/2021   ID:  [name], DOB 1950/09/14, MRN [id number]  PCP:  Elaine Solian, MD  Cardiologist:  Elaine Klein, MD  Electrophysiologist:  None   Chief Complaint:  CAD/PAD follow up, AFib  History of Present Illness:    [name] is a 70 y.o. female with early onset and extensive atherosclerotic disease involving the coronary, carotid, renal, mesenteric and lower extremity arterial beds.  She has type 2 diabetes mellitus, hypercholesterolemia and moderate chronic kidney disease.  She has long-term persistent (possibly permanent) atrial fibrillation.  She has preserved left ventricular systolic function and does not have manifestations of congestive heart failure.  The patient specifically denies any chest pain at rest exertion, dyspnea at rest or with exertion, orthopnea, paroxysmal nocturnal dyspnea, syncope, palpitations, focal neurological deficits, lower extremity edema, unexplained weight gain, cough, hemoptysis or wheezing.  She has mild intermittent claudication, appears to be symmetrical.  She walks for about a block twice a week, otherwise quite sedentary.  She's done quite well since her last appointment and had any hospitalizations or emergency room visits.  Her most recent labs from August show an LDL cholesterol of 59 and a hemoglobin A1c of 5.9%.  Creatinine is actually improved at about 1.5.  She had CABG X 3 with LIMA-LAD, SVG-OM,SVG-RCA 2004. Re studied in 2005 and this showed patent grafts, with distal LAD and OM disease. Her Myoview in 7/12 was low risk, but repeat in 11/2013 showed new anterolateral ischemia.  She has no good revascularization options in the anterior wall. Although the LIMA anastomosis is patent there is no retrograde flow to the proximal LAD and the distal LAD is diffusely diseased and not amenable to PCI. Her most recent echocardiogram in January 2014 showed  normal left ventricular systolic function and regional wall motion. Echocardiogram in January 2014 showed normal left ventricular systolic function and wall motion with an estimated systolic PA pressure of 44 mm Hg   In March of 2013 she had DCCV. She was in normal sinus rhythm in August 2015 in the clinic.  In September 2016, December 2016 and January 2018 she presented with rate controlled asymptomatic atrial fibrillation. She has PAD and has had prior renal and popliteal stenting.   - Last lower extremity arterial Doppler was performed in April 2016 and showed bilateral moderate stenosis in the superficial femoral arteries with three-vessel runoff, ABI 0.69 on right, 0.7 on left. - She had left carotid stenting 02/12/12 and post op she was hypotensive and bradycardic.  A carotid Doppler scan performed in January 2020 showed widely patent left carotid stent and 40-59% right internal carotid stenosis (an improvement from the year before). 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 She has hyperlipidemia, diabetes mellitus with type 2 and hypertension.  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   Past Medical History:  Diagnosis Date   Acute kidney failure ([place])    ASHD (arteriosclerotic heart disease)    native vessel   Atrial fibrillation ([place])    Breast cancer, left ([place])    CAD (coronary artery disease) 2004   multivessel, status post CABG x3   Carotid artery disease ([place])    stent in 2013   CKD (chronic  kidney disease), stage III ([place])    Diabetes ([place])    GERD (gastroesophageal reflux disease)    History of tobacco abuse    Hx of adenomatous polyp of colon 07/26/2016   Hyperlipidemia    Hypertension    Hyperthyroidism    Myocardial infarction [facility]) 2002   Peripheral vascular disease  ([facility])    S/P popliteal artery and renal artery stents   Renovascular hypertension    S/P CABG x 3 2004   Thyroid goiter    Tobacco abuse    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 Marella Chimes)   CARDIAC CATHETERIZATION  12/23/2003   patent LIMA, LAD, SVG to OM1, SVG to RCA, distal disease beyond graft insertion (Dr. Marella Chimes)   CARDIOVERSION  09/11/2011   Procedure: CARDIOVERSION;  Surgeon: Elaine Klein, MD;  Location: [facility];  Service: Cardiovascular;  Laterality: N/A;   CAROTID ANGIOGRAM Bilateral 02/12/2012   Procedure: CAROTID Cyril Loosen;  Surgeon: Serafina Mitchell, MD;  Location: [facility] C[facility] LAB;  Service: Cardiovascular;  Laterality: Bilateral;   CAROTID DOPPLER  03/2013   right bulb & prox ICA w/50-69% stenosis, L ICA stent open/patent   CAROTID STENT  02/12/2012   Carotid Stent Insertion, Carotid Angiogram - 9x40 Cordis Precise stent  (Dr. Pierre Bali)   CAROTID STENT INSERTION N/A 02/12/2012   Procedure: CAROTID STENT INSERTION;  Surgeon: Serafina Mitchell, MD;  Location: [facility] CATH LAB;  Service: Cardiovascular;  Laterality: N/A;   CORONARY ARTERY BYPASS GRAFT  09/24/2002   LIMA to LAD, SVG to Cfx, SVG to RCA (Dr. Prescott Gum)   LOWER EXTREMITY ARTERIAL DOPPLER  09/2012   bilateral ABIS (mod arterial insuff at rest); R SFA 70-99% stenosis, L SFA 50-69% stenosis   NM MYOCAR PERF WALL MOTION  12/2010   persnatine - moderate perfusion defect in apical anterior/apical/basal inferolateral/mid inferolateral/apical lateral walls - EF 65% - low risk   RENAL ARTERY ANGIOPLASTY  02/11/2003   transluminal angioplasty &  stent wtih 4x86mm Genesis balloon expandable stent premounted on Cordis aviatory balloon (Dr. Charlyne [place])   RENAL DOPPLER  09/2012   R prox renal artery @ stent equal/less that 60% diameter reduction; L renal artery 1-59% stenosis   TRANSTHORACIC ECHOCARDIOGRAM  07/2012   EF 55-65%, mild conc LVH, grade 1 diastolic dysfunction; calcicifed MV annulus, mild MR; LA mild-mod dilated; RA mod dilated; mod TR; PA peak pressure 44mmHg, RVSP increased (pulm htn)     Current Meds  Medication Sig   amLODipine (NORVASC) 10 MG tablet TAKE 1 TABLET BY MOUTH  DAILY   ELIQUIS 5 MG TABS tablet TAKE 1 TABLET(5 MG) BY MOUTH TWICE DAILY   lansoprazole (PREVACID) 15 MG capsule Take 1 capsule (15 mg total) by mouth daily.   metFORMIN (GLUCOPHAGE) 1000 MG tablet Take 1 tablet (1,000 mg total) by mouth 2 (two) times daily with a meal.   methimazole (TAPAZOLE) 10 MG tablet Take 5 mg by mouth daily.   metoprolol tartrate (LOPRESSOR) 25 MG tablet TAKE 1 TABLET(25 MG) BY MOUTH TWICE DAILY   rosuvastatin (CRESTOR) 10 MG tablet Take 1 tablet (10 mg total)  by mouth daily.   triamterene-hydrochlorothiazide (MAXZIDE-25) 37.5-25 MG tablet TAKE 1 TABLET BY MOUTH  DAILY   valsartan (DIOVAN) 160 MG tablet TAKE 1 TABLET BY MOUTH  TWICE DAILY     Allergies:   Patient has no known allergies.   Social History   Tobacco Use   Smoking status: Former    Packs/day: 1.00    Years: 30.00    Pack years: 30.00    Types: Cigarettes    Quit date: 07/02/2001    Years since quitting: 19.8   Smokeless tobacco: Never  Vaping Use   Vaping Use: Never used  Substance Use Topics   Alcohol use: Yes    Alcohol/week: 2.0 - 3.0 standard drinks    Types: 2 - 3 Standard drinks or equivalent per week    Comment: socially   Drug use: No     Family Hx: The patient's family history includes Deep vein thrombosis in her sister; Diabetes in her brother and sister; Heart disease in her sister; Hyperlipidemia in her sister and son; Hypertension in  her mother, sister, and son; Other in her brother and mother. There is no history of Colon cancer, Stomach cancer, Esophageal cancer, Rectal cancer, or Liver cancer.  ROS:   Please see the history of present illness.     All other systems reviewed and are negative.   Prior CV studies:   The following studies were reviewed today:  Carotid duplex ultrasound February 2022, lower extremity ABIs February 2021 renal duplex ultrasound May 2019,   Labs/Other Tests and Data Reviewed:    EKG: Ordered today and personally reviewed shows atrial fibrillation with controlled ventricular response and rare PVCs, incomplete right bundle branch block pattern (QRS 1 and 2 ms) and nonspecific ST-T changes, QTC 456 ms  Recent Labs: No results found for requested labs within last 8760 hours.  01/30/2021 potassium 4.5, ALT 8, TSH 2.52, hemoglobin A1c 5.9% 09/15/2020 Creatinine 1.48 Recent Lipid Panel Lab Results  Component Value Date/Time   CHOL 101 11/30/2013 10:14 AM   TRIG 70 11/30/2013 10:14 AM   HDL 43 11/30/2013 10:14 AM   CHOLHDL 2.3 11/30/2013 10:14 AM   LDLCALC 44 11/30/2013 10:14 AM  01/30/2021 Cholesterol 115, HDL 46, LDL 59, triglycerides 51  Wt Readings from Last 3 Encounters:  05/18/21 177 lb (80.3 kg)  04/19/20 177 lb (80.3 kg)  12/29/19 177 lb (80.3 kg)     Objective:    Vital Signs:  BP (!) 152/76 (BP Location: Left Arm, Patient Position: Sitting, Cuff Size: Normal)   Pulse 71   Ht 5\' 5"  (1.651 m)   Wt 177 lb (80.3 kg)   SpO2 100%   BMI 29.45 kg/m     General: Alert, oriented x3, no distress, appears well, overweight but not obese Head: no evidence of trauma, PERRL, EOMI, no exophtalmos or lid lag, no myxedema, no xanthelasma; normal ears, nose and oropharynx Neck: normal jugular venous pulsations and no hepatojugular reflux; brisk carotid pulses without delay and faint bilateral carotid bruits Chest: clear to auscultation, no signs of consolidation by percussion or  palpation, normal fremitus, symmetrical and full respiratory excursions Cardiovascular: normal position and quality of the apical impulse, regular rhythm, normal first and second heart sounds, no murmurs, rubs or gallops Abdomen: no tenderness or distention, no masses by palpation, no abnormal pulsatility or arterial bruits, normal bowel sounds, no hepatosplenomegaly Extremities: no clubbing, cyanosis or edema; 2+ radial, ulnar and brachial pulses bilaterally; diminished pedal pulses bilaterally.  Bilateral femoral  bruits Neurological: grossly nonfocal Psych: Normal mood and affect  ASSESSMENT & PLAN:    AFib:   Permanent arrhythmia, well rate controlled, on anticoagulation.CHADSVasc 5 (age, gender, diabetes mellitus, hypertension, vascular disease).  CAD: Denies angina on current regimen and has normal left ventricular systolic function (last assessed in 2014).   Preserved left ventricular systolic function.  She is known to have anterolateral ischemia by nuclear stress testing (patent LIMA to LAD but with occlusion of the LAD without retrograde flow to proximal vessel, diffuse distal LAD disease), but is best suited for medical treatment. There are no good options for revascularization.  Continue beta-blocker and calcium channel blocker. Carotid stenosis: Patent left carotid stent.  Due for repeat evaluation by ultrasound in February 2022.  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 There is also 50% stenosis in the M1 segment of the left middle cerebral artery.  No neurological complaints.  PAD: She appears to describe Fontaine class II intermittent claudication.  Scheduled for repeat ABIs.  Encouraged daily walking to limit of symptoms, to promote collateral formation.. S/P R renal stent: Renal function has actually improved.  Usually blood pressure is well controlled.  We have stopped doing yearly  duplex ultrasound. HLP: All lipid parameters are in desirable range. DM: Excellent hemoglobin A1c at 5.9%.  Encouraged her to try to lose weight.  Daily exercise would be very helpful. Eliquis: Renal function has improved.  Denies bleeding problems.   CKD 3b-4: Previous creatinine baseline seem to be around 2.1, but creatinine was 1.7 in September 2021 and her most recent creatinine was improved to 1.48 in March of this year. HTN: Usually well controlled, slightly high today, but was 113/80 in August.  No changes made to her medications.  Asked her to check her blood pressure daily and send me a record next week.  There is room to increase her valsartan to 320 mg daily if her blood pressure is indeed over target of 130/80.    COVID-19 Education: The signs and symptoms of COVID-19 were discussed with the patient and how to seek care for testing (follow up with PCP or arrange E-visit).  The importance of social distancing was discussed today.  Time:   Today, I have spent 26 minutes with the patient with telehealth technology discussing the above problems.     Medication Adjustments/Labs and Tests Ordered: Current medicines are reviewed at length with the patient today.  Concerns regarding medicines are outlined above.   Tests Ordered: Orders Placed This Encounter  Procedures   EKG 12-Lead   VAS [place] ABI WITH/WO TBI     Medication Changes: No orders of the defined types were placed in this encounter.   Disposition:  Follow up  12 months  Signed, Elaine Klein, MD  05/18/2021 12:04 PM    [place]

## 2021-05-18 NOTE — Patient Instructions (Signed)
Medication Instructions:  No changes *If you need a refill on your cardiac medications before your next appointment, please call your pharmacy*   Lab Work: None ordered If you have labs (blood work) drawn today and your tests are completely normal, you will receive your results only by: [place] (if you have MyChart) OR A paper copy in the mail If you have any lab test that is abnormal or we need to change your treatment, we will call you to review the results.   Testing/Procedures: Your physician has requested that you have an ankle brachial index (ABI). During this test an ultrasound and blood pressure cuff are used to evaluate the arteries that supply the arms and legs with blood. Allow thirty minutes for this exam. There are no restrictions or special instructions. This will take place at [place], [address].   Follow-Up: At [facility], you and your health needs are our priority.  As part of our continuing mission to provide you with exceptional heart care, we have created designated Provider Care Teams.  These Care Teams include your primary Cardiologist (physician) and Advanced Practice Providers (APPs -  Physician Assistants and Nurse Practitioners) who all work together to provide you with the care you need, when you need it.  We recommend signing up for the patient portal called "MyChart".  Sign up information is provided on this After Visit Summary.  MyChart is used to connect with patients for Virtual Visits (Telemedicine).  Patients are able to view lab/test results, encounter notes, upcoming appointments, etc.  Non-urgent messages can be sent to your provider as well.   To learn more about what you can do with MyChart, go to [email or web address].    Your next appointment:   12 month(s)  The format for your next appointment:   In Person  Provider:   Sanda Klein, MD     Other Instructions Dr. Sallyanne Kuster would like you to check your blood pressure  daily for the next week.  Keep a journal of these daily blood pressure and heart rate readings and call our office or send a message through [place] with the results. Thank you!  It is best to check your BP 1-2 hours after taking your medications to see the medications effectiveness on your BP.    Here are some tips that our clinical pharmacists share for home BP monitoring:          Rest 10 minutes before taking your blood pressure.          Don't smoke or drink caffeinated beverages for at least 30 minutes before.          Take your blood pressure before (not after) you eat.          Sit comfortably with your back supported and both feet on the floor (don't cross your legs).          Elevate your arm to heart level on a table or a desk.          Use the proper sized cuff. It should fit smoothly and snugly around your bare upper arm. There should be enough room to slip a fingertip under the cuff. The bottom edge of the cuff should be 1 inch above the crease of the elbow.

## 2021-06-06 NOTE — Progress Notes (Signed)
She sent [place] a daily BP log 11/20-11/28  BP was 90/57-123/60, HR 52-75. No elevated BP. Continue same meds.

## 2021-07-10 ENCOUNTER — Other Ambulatory Visit (INDEPENDENT_AMBULATORY_CARE_PROVIDER_SITE_OTHER): Payer: Self-pay | Admitting: Ophthalmology

## 2021-07-13 ENCOUNTER — Other Ambulatory Visit: Payer: Self-pay | Admitting: Cardiovascular Disease

## 2021-07-14 ENCOUNTER — Ambulatory Visit: Payer: Medicare PPO | Admitting: Cardiovascular Disease

## 2021-08-09 DIAGNOSIS — E1159 Type 2 diabetes mellitus with other circulatory complications: Secondary | ICD-10-CM | POA: Diagnosis not present

## 2021-08-09 DIAGNOSIS — R202 Paresthesia of skin: Secondary | ICD-10-CM | POA: Diagnosis not present

## 2021-08-09 DIAGNOSIS — D509 Iron deficiency anemia, unspecified: Secondary | ICD-10-CM | POA: Diagnosis not present

## 2021-08-09 DIAGNOSIS — E0590 Thyrotoxicosis, unspecified without thyrotoxic crisis or storm: Secondary | ICD-10-CM | POA: Diagnosis not present

## 2021-08-09 DIAGNOSIS — E040 Nontoxic diffuse goiter: Secondary | ICD-10-CM | POA: Diagnosis not present

## 2021-08-09 DIAGNOSIS — E785 Hyperlipidemia, unspecified: Secondary | ICD-10-CM | POA: Diagnosis not present

## 2021-08-09 DIAGNOSIS — I480 Paroxysmal atrial fibrillation: Secondary | ICD-10-CM | POA: Diagnosis not present

## 2021-08-09 DIAGNOSIS — I129 Hypertensive chronic kidney disease with stage 1 through stage 4 chronic kidney disease, or unspecified chronic kidney disease: Secondary | ICD-10-CM | POA: Diagnosis not present

## 2021-08-09 DIAGNOSIS — I25718 Atherosclerosis of autologous vein coronary artery bypass graft(s) with other forms of angina pectoris: Secondary | ICD-10-CM | POA: Diagnosis not present

## 2021-08-09 DIAGNOSIS — N1832 Chronic kidney disease, stage 3b: Secondary | ICD-10-CM | POA: Diagnosis not present

## 2021-08-10 ENCOUNTER — Inpatient Hospital Stay (HOSPITAL_COMMUNITY): Admission: RE | Admit: 2021-08-10 | Payer: Medicare PPO | Source: Ambulatory Visit

## 2021-08-16 ENCOUNTER — Other Ambulatory Visit: Payer: Self-pay | Admitting: Cardiovascular Disease

## 2021-08-16 DIAGNOSIS — I6523 Occlusion and stenosis of bilateral carotid arteries: Secondary | ICD-10-CM

## 2021-08-16 DIAGNOSIS — Z95828 Presence of other vascular implants and grafts: Secondary | ICD-10-CM

## 2021-08-23 ENCOUNTER — Other Ambulatory Visit: Payer: Self-pay

## 2021-08-23 ENCOUNTER — Ambulatory Visit (HOSPITAL_BASED_OUTPATIENT_CLINIC_OR_DEPARTMENT_OTHER)
Admission: RE | Admit: 2021-08-23 | Discharge: 2021-08-23 | Disposition: A | Discharge status: Home / Self Care | Payer: Medicare PPO | Source: Ambulatory Visit | Attending: Cardiovascular Disease | Admitting: Cardiovascular Disease

## 2021-08-23 ENCOUNTER — Ambulatory Visit (HOSPITAL_COMMUNITY)
Admission: RE | Admit: 2021-08-23 | Discharge: 2021-08-23 | Disposition: A | Discharge status: Home / Self Care | Payer: Medicare PPO | Source: Ambulatory Visit | Attending: Cardiovascular Disease | Admitting: Cardiovascular Disease

## 2021-08-23 DIAGNOSIS — I6523 Occlusion and stenosis of bilateral carotid arteries: Secondary | ICD-10-CM | POA: Diagnosis not present

## 2021-08-23 DIAGNOSIS — Z95828 Presence of other vascular implants and grafts: Secondary | ICD-10-CM | POA: Diagnosis not present

## 2021-08-23 DIAGNOSIS — I739 Peripheral vascular disease, unspecified: Secondary | ICD-10-CM | POA: Diagnosis not present

## 2021-08-25 ENCOUNTER — Other Ambulatory Visit: Payer: Self-pay | Admitting: Adult Health

## 2021-08-28 ENCOUNTER — Other Ambulatory Visit: Payer: Self-pay | Admitting: *Deleted

## 2021-08-28 DIAGNOSIS — I739 Peripheral vascular disease, unspecified: Secondary | ICD-10-CM

## 2021-08-28 DIAGNOSIS — I6523 Occlusion and stenosis of bilateral carotid arteries: Secondary | ICD-10-CM

## 2021-09-20 DIAGNOSIS — N1832 Chronic kidney disease, stage 3b: Secondary | ICD-10-CM | POA: Diagnosis not present

## 2021-09-20 DIAGNOSIS — I129 Hypertensive chronic kidney disease with stage 1 through stage 4 chronic kidney disease, or unspecified chronic kidney disease: Secondary | ICD-10-CM | POA: Diagnosis not present

## 2021-09-20 DIAGNOSIS — Z8679 Personal history of other diseases of the circulatory system: Secondary | ICD-10-CM | POA: Diagnosis not present

## 2021-09-20 DIAGNOSIS — E1122 Type 2 diabetes mellitus with diabetic chronic kidney disease: Secondary | ICD-10-CM | POA: Diagnosis not present

## 2021-10-31 ENCOUNTER — Other Ambulatory Visit: Payer: Self-pay | Admitting: Adult Health

## 2021-10-31 DIAGNOSIS — I480 Paroxysmal atrial fibrillation: Secondary | ICD-10-CM

## 2021-10-31 NOTE — Telephone Encounter (Signed)
Prescription refill request for Eliquis received. ?Indication: Afib  ?Last office visit:07/18/20 ([place])  ?Scr: 1.42 (09/20/21 via [place])  ?Age: 71 ?Weight: 80.3kg ? ?Appropriate dose and refill sent to requested pharmacy.  ?

## 2022-02-05 ENCOUNTER — Other Ambulatory Visit: Payer: Self-pay | Admitting: Cardiovascular Disease

## 2022-02-05 ENCOUNTER — Other Ambulatory Visit: Payer: Self-pay | Admitting: Internal Medicine

## 2022-02-05 DIAGNOSIS — Z1231 Encounter for screening mammogram for malignant neoplasm of breast: Secondary | ICD-10-CM

## 2022-02-15 DIAGNOSIS — N1832 Chronic kidney disease, stage 3b: Secondary | ICD-10-CM | POA: Diagnosis not present

## 2022-02-15 DIAGNOSIS — E040 Nontoxic diffuse goiter: Secondary | ICD-10-CM | POA: Diagnosis not present

## 2022-02-15 DIAGNOSIS — R7989 Other specified abnormal findings of blood chemistry: Secondary | ICD-10-CM | POA: Diagnosis not present

## 2022-02-15 DIAGNOSIS — E785 Hyperlipidemia, unspecified: Secondary | ICD-10-CM | POA: Diagnosis not present

## 2022-02-15 DIAGNOSIS — E1159 Type 2 diabetes mellitus with other circulatory complications: Secondary | ICD-10-CM | POA: Diagnosis not present

## 2022-03-06 ENCOUNTER — Ambulatory Visit: Payer: Medicare PPO

## 2022-03-29 ENCOUNTER — Ambulatory Visit: Payer: Medicare PPO

## 2022-04-18 ENCOUNTER — Ambulatory Visit: Payer: Medicare PPO

## 2022-05-17 ENCOUNTER — Encounter (INDEPENDENT_AMBULATORY_CARE_PROVIDER_SITE_OTHER): Payer: Medicare PPO | Admitting: Ophthalmology

## 2022-05-18 DIAGNOSIS — I6523 Occlusion and stenosis of bilateral carotid arteries: Secondary | ICD-10-CM | POA: Diagnosis not present

## 2022-05-18 DIAGNOSIS — I129 Hypertensive chronic kidney disease with stage 1 through stage 4 chronic kidney disease, or unspecified chronic kidney disease: Secondary | ICD-10-CM | POA: Diagnosis not present

## 2022-05-18 DIAGNOSIS — Z0000 Encounter for general adult medical examination without abnormal findings: Secondary | ICD-10-CM | POA: Diagnosis not present

## 2022-05-18 DIAGNOSIS — I25718 Atherosclerosis of autologous vein coronary artery bypass graft(s) with other forms of angina pectoris: Secondary | ICD-10-CM | POA: Diagnosis not present

## 2022-05-18 DIAGNOSIS — E1159 Type 2 diabetes mellitus with other circulatory complications: Secondary | ICD-10-CM | POA: Diagnosis not present

## 2022-05-18 DIAGNOSIS — I480 Paroxysmal atrial fibrillation: Secondary | ICD-10-CM | POA: Diagnosis not present

## 2022-05-18 DIAGNOSIS — D509 Iron deficiency anemia, unspecified: Secondary | ICD-10-CM | POA: Diagnosis not present

## 2022-05-18 DIAGNOSIS — N1832 Chronic kidney disease, stage 3b: Secondary | ICD-10-CM | POA: Diagnosis not present

## 2022-05-18 DIAGNOSIS — Z23 Encounter for immunization: Secondary | ICD-10-CM | POA: Diagnosis not present

## 2022-05-18 DIAGNOSIS — R82998 Other abnormal findings in urine: Secondary | ICD-10-CM | POA: Diagnosis not present

## 2022-05-18 DIAGNOSIS — E785 Hyperlipidemia, unspecified: Secondary | ICD-10-CM | POA: Diagnosis not present

## 2022-05-22 ENCOUNTER — Ambulatory Visit
Admission: RE | Admit: 2022-05-22 | Discharge: 2022-05-22 | Disposition: A | Discharge status: Home / Self Care | Payer: Medicare PPO | Source: Ambulatory Visit | Attending: Internal Medicine | Admitting: Internal Medicine

## 2022-05-22 DIAGNOSIS — Z1231 Encounter for screening mammogram for malignant neoplasm of breast: Secondary | ICD-10-CM | POA: Diagnosis not present

## 2022-06-11 ENCOUNTER — Other Ambulatory Visit: Payer: Self-pay | Admitting: Cardiovascular Disease

## 2022-06-11 DIAGNOSIS — I480 Paroxysmal atrial fibrillation: Secondary | ICD-10-CM

## 2022-06-11 NOTE — Telephone Encounter (Addendum)
Eliquis '5mg'$  refill request received. Patient is 71 years old, weight-80.3kg, Crea-1.42 on 09/20/2021 via [place], and last seen by Dr. Sallyanne Kuster on 05/18/2021-Pt needs an appt. Dose is appropriate based on dosing criteria.   Pt needs an appt with Cardiologist, called pt and had to leave a message to call back regarding an appt. Will send a message to schedulers to reach out for scheduling an appt.   Pt has an appt pending for 07/18/22 per schedulers. Will send in refill at this time.

## 2022-06-11 NOTE — Telephone Encounter (Signed)
Patient has appt scheduled for 07/18/22.  Offered her appt for 12/21, she was not able to make that appt.

## 2022-06-27 DIAGNOSIS — H35072 Retinal telangiectasis, left eye: Secondary | ICD-10-CM | POA: Diagnosis not present

## 2022-06-27 DIAGNOSIS — H35352 Cystoid macular degeneration, left eye: Secondary | ICD-10-CM | POA: Diagnosis not present

## 2022-06-27 DIAGNOSIS — H2511 Age-related nuclear cataract, right eye: Secondary | ICD-10-CM | POA: Diagnosis not present

## 2022-07-18 ENCOUNTER — Encounter: Payer: Self-pay | Admitting: Nurse Practitioner

## 2022-07-18 ENCOUNTER — Ambulatory Visit: Payer: Medicare PPO | Attending: Nurse Practitioner | Admitting: Nurse Practitioner

## 2022-07-18 VITALS — BP 138/70 | HR 66 | Ht 65.0 in | Wt 178.0 lb

## 2022-07-18 DIAGNOSIS — I739 Peripheral vascular disease, unspecified: Secondary | ICD-10-CM

## 2022-07-18 DIAGNOSIS — I6523 Occlusion and stenosis of bilateral carotid arteries: Secondary | ICD-10-CM

## 2022-07-18 DIAGNOSIS — I701 Atherosclerosis of renal artery: Secondary | ICD-10-CM | POA: Diagnosis not present

## 2022-07-18 DIAGNOSIS — N1832 Chronic kidney disease, stage 3b: Secondary | ICD-10-CM | POA: Diagnosis not present

## 2022-07-18 DIAGNOSIS — I2510 Atherosclerotic heart disease of native coronary artery without angina pectoris: Secondary | ICD-10-CM | POA: Diagnosis not present

## 2022-07-18 DIAGNOSIS — E1151 Type 2 diabetes mellitus with diabetic peripheral angiopathy without gangrene: Secondary | ICD-10-CM

## 2022-07-18 DIAGNOSIS — E785 Hyperlipidemia, unspecified: Secondary | ICD-10-CM | POA: Diagnosis not present

## 2022-07-18 DIAGNOSIS — I4811 Longstanding persistent atrial fibrillation: Secondary | ICD-10-CM

## 2022-07-18 DIAGNOSIS — I10 Essential (primary) hypertension: Secondary | ICD-10-CM | POA: Diagnosis not present

## 2022-07-18 NOTE — Patient Instructions (Signed)
Medication Instructions:  Your physician recommends that you continue on your current medications as directed. Please refer to the Current Medication list given to you today.   *If you need a refill on your cardiac medications before your next appointment, please call your pharmacy*   Lab Work: Your physician recommends that you complete lab work today. CMET & CBC  If you have labs (blood work) drawn today and your tests are completely normal, you will receive your results only by: [place] (if you have MyChart) OR A paper copy in the mail If you have any lab test that is abnormal or we need to change your treatment, we will call you to review the results.   Testing/Procedures: NONE ordered at this time of appointment     Follow-Up: At [facility], you and your health needs are our priority.  As part of our continuing mission to provide you with exceptional heart care, we have created designated Provider Care Teams.  These Care Teams include your primary Cardiologist (physician) and Advanced Practice Providers (APPs -  Physician Assistants and Nurse Practitioners) who all work together to provide you with the care you need, when you need it.  We recommend signing up for the patient portal called "MyChart".  Sign up information is provided on this After Visit Summary.  MyChart is used to connect with patients for Virtual Visits (Telemedicine).  Patients are able to view lab/test results, encounter notes, upcoming appointments, etc.  Non-urgent messages can be sent to your provider as well.   To learn more about what you can do with MyChart, go to [email or web address].    Your next appointment:   1 year(s)  Provider:   Sanda Klein, MD     Other Instructions Please schedule Carotid US and ABIs (orders previously placed)

## 2022-07-18 NOTE — Progress Notes (Signed)
Office Visit    Patient Name: [name] Date of Encounter: 07/18/2022  Primary Care Provider:  Prince Solian, MD Primary Cardiologist:  Sanda Klein, MD  Chief Complaint    72 year old female with a history of CAD s/p CABG x 3 (LIMA-LAD, SVG-OM, SVG-RCA) in 2004, persistent atrial fibrillation, CAD, carotid artery stenosis, renal artery stenosis, hypertension, hyperlipidemia, type 2 diabetes, and CKD stage IIIb-IV who presents for follow-up related to CAD and atrial fibrillation.   Past Medical History    Past Medical History:  Diagnosis Date   Acute kidney failure (HCC)    ASHD (arteriosclerotic heart disease)    native vessel   Atrial fibrillation (HCC)    Breast cancer, left (HCC)    CAD (coronary artery disease) 2004   multivessel, status post CABG x3   Carotid artery disease ([place])    stent in 2013   CKD (chronic kidney disease), stage III (HCC)    Diabetes (HCC)    GERD (gastroesophageal reflux disease)    History of tobacco abuse    Hx of adenomatous polyp of colon 07/26/2016   Hyperlipidemia    Hypertension    Hyperthyroidism    Myocardial infarction [facility]) 2002   Peripheral vascular disease (HCC)    S/P popliteal artery and renal artery stents   Renovascular hypertension    S/P CABG x 3 2004   Thyroid goiter    Tobacco abuse    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 Marella Chimes)   CARDIAC CATHETERIZATION  12/23/2003   patent LIMA, LAD, SVG to OM1, SVG to RCA, distal disease beyond graft insertion (Dr. Marella Chimes)   CARDIOVERSION  09/11/2011   Procedure:  CARDIOVERSION;  Surgeon: Sanda Klein, MD;  Location: [place];  Service: Cardiovascular;  Laterality: N/A;   CAROTID ANGIOGRAM Bilateral 02/12/2012   Procedure: CAROTID Cyril Loosen;  Surgeon: Serafina Mitchell, MD;  Location: [facility]: [facility] CATH LAB;  Service: Cardiovascular;  Laterality: Bilateral;   CAROTID DOPPLER  03/2013   right bulb & prox ICA w/50-69% stenosis, L ICA stent open/patent   CAROTID STENT  02/12/2012   Carotid Stent Insertion, Carotid Angiogram - 9x40 Cordis Precise stent  (Dr. Pierre Bali)   CAROTID STENT INSERTION N/A 02/12/2012   Procedure: CAROTID STENT INSERTION;  Surgeon: Serafina Mitchell, MD;  Location: [facility] CATH LAB;  Service: Cardiovascular;  Laterality: N/A;   CORONARY ARTERY BYPASS GRAFT  09/24/2002   LIMA to LAD, SVG to Cfx, SVG to RCA (Dr. Prescott Gum)   LOWER EXTREMITY ARTERIAL DOPPLER  09/2012   bilateral ABIS (mod arterial insuff at rest); R SFA 70-99% stenosis, L SFA 50-69% stenosis   NM MYOCAR PERF WALL MOTION  12/2010   persnatine - moderate perfusion defect in apical anterior/apical/basal inferolateral/mid inferolateral/apical lateral walls - EF 65% - low risk   RENAL ARTERY ANGIOPLASTY  02/11/2003   transluminal angioplasty & stent wtih 4x46m Genesis balloon expandable stent premounted on Cordis aviatory balloon (Dr. RCharlyne Petrin   RENAL DOPPLER  09/2012   R prox renal artery @ stent equal/less that 60% diameter reduction; L renal artery 1-59% stenosis  TRANSTHORACIC ECHOCARDIOGRAM  07/2012   EF 55-65%, mild conc LVH, grade 1 diastolic dysfunction; calcicifed MV annulus, mild MR; LA mild-mod dilated; RA mod dilated; mod TR; PA peak pressure 73mHg, RVSP increased (pulm htn)    Allergies  No Known Allergies   Labs/Other Studies Reviewed    The following studies were reviewed today: Carotid ultrasound 08/23/2021: Summary:  Right Carotid: Velocities in the right ICA are consistent with a 60-79% stenosis.   Left Carotid: The left distal CCA to mid/distal ICA stent is patent  with essentially stable velocities when compared to the prior exam.   Vertebrals: Right vertebral artery demonstrates antegrade flow. Left vertebral artery was not visualized.   *See table(s) above for measurements and observations.  Suggest follow up study in 12 months.   ABIs 08/23/2021:   Summary:  Right: Resting right ankle-brachial index indicates moderate right lower  extremity arterial disease. The right toe-brachial index is abnormal.   Left: Resting left ankle-brachial index indicates mild left lower  extremity arterial disease. The left toe-brachial index is normal.   *See table(s) above for measurements and observations.    Suggest follow up study PRN.   Recent Labs: No results found for requested labs within last 365 days.  Recent Lipid Panel    Component Value Date/Time   CHOL 101 11/30/2013 1014   TRIG 70 11/30/2013 1014   HDL 43 11/30/2013 1014   CHOLHDL 2.3 11/30/2013 1014   VLDL 14 11/30/2013 1014   [place] 11/30/2013 1014    History of Present Illness    72year old female with the above past medical history including CAD s/p CABG x 3 (LIMA-LAD, SVG-OM, SVG-RCA) in 2004, persistent atrial fibrillation, PAD, carotid artery stenosis, renal artery stenosis, hypertension, hyperlipidemia, type 2 diabetes, and CKD stage IIIb-IV.  Cath in 2005 showed patent grafts, distal LAD and OM disease.  Myoview in 2012 was low risk, repeat Myoview in 2015 showed new anterolateral ischemia.  She has a history of atrial fibrillation s/p DCCV in 2013.  She did have recurrence of atrial fibrillation and has remained on a rate control strategy on Eliquis and metoprolol.  She has history of PAD with prior renal and popliteal stenting.  ABIs in 08/2021 revealed moderate right lower extremity arterial disease, mild left lower extremity arterial disease.  She has a history carotid artery stenosis s/p L ICA stenting. Carotid ultrasound in 08/2021 showed 60 to 79% R ICA stenosis, patent  left distal CCA to mid/distal LICA stent with stable velocities when compared to prior exam.  Repeat study was recommended in 12 months.  She was last seen in the office on 05/18/2021 and was stable from a cardiac standpoint.  She denied symptoms concerning for angina.  BP was well-controlled.  She presents today for follow-up.  Since her last visit she has done well from a cardiac standpoint.  She denies any symptoms concerning for angina, denies palpitations, dyspnea, dizziness, presyncope, syncope.  Overall, she reports feeling well.  Home Medications    Current Outpatient Medications  Medication Sig Dispense Refill   amLODipine (NORVASC) 10 MG tablet TAKE 1 TABLET EVERY DAY 90 tablet 3   apixaban (ELIQUIS) 5 MG TABS tablet Take 1 tablet (5 mg total) by mouth 2 (two) times daily. Please keep upcoming appointment. 180 tablet 0   ketorolac (ACULAR) 0.5 % ophthalmic solution INSTILL 1 DROP IN LEFT EYE TWICE DAILY 5 mL 6   lansoprazole (PREVACID) 15 MG capsule Take 1 capsule (15 mg total) by mouth daily.  90 capsule 3   metFORMIN (GLUCOPHAGE) 1000 MG tablet Take 1 tablet (1,000 mg total) by mouth 2 (two) times daily with a meal. 180 tablet 3   methimazole (TAPAZOLE) 10 MG tablet Take 5 mg by mouth daily.     metoprolol tartrate (LOPRESSOR) 25 MG tablet TAKE 1 TABLET BY MOUTH TWICE DAILY 180 tablet 3   nitroGLYCERIN (NITROSTAT) 0.4 MG SL tablet Place 1 tablet (0.4 mg total) under the tongue every 5 (five) minutes as needed for chest pain. 25 tablet 6   rosuvastatin (CRESTOR) 10 MG tablet TAKE 1 TABLET EVERY DAY 90 tablet 2   triamterene-hydrochlorothiazide (MAXZIDE-25) 37.5-25 MG tablet TAKE 1 TABLET EVERY DAY 90 tablet 3   valsartan (DIOVAN) 160 MG tablet TAKE 1 TABLET BY MOUTH  TWICE DAILY 180 tablet 0   No current facility-administered medications for this visit.     Review of Systems   She denies chest pain, palpitations, dyspnea, pnd, orthopnea, n, v, dizziness, syncope, edema, weight  gain, or early satiety. All other systems reviewed and are otherwise negative except as noted above.   Physical Exam    VS:  BP 138/70 (BP Location: Left Arm, Patient Position: Sitting, Cuff Size: Large)   Pulse 66   Ht '5\' 5"'$  (1.651 m)   Wt 178 lb (80.7 kg)   SpO2 98%   BMI 29.62 kg/m   GEN: Well nourished, well developed, in no acute distress. HEENT: normal. Neck: Supple, no JVD, carotid bruits, or masses. Cardiac: IRIR, no murmurs, rubs, or gallops. No clubbing, cyanosis, edema.  Radials/DP/PT 2+ and equal bilaterally.  Respiratory:  Respirations regular and unlabored, clear to auscultation bilaterally. GI: Soft, nontender, nondistended, BS + x 4. MS: no deformity or atrophy. Skin: warm and dry, no rash. Neuro:  Strength and sensation are intact. Psych: Normal affect.  Accessory Clinical Findings    ECG personally reviewed by me today -atrial fibrillation, 66 bpm, incomplete RBBB- no acute changes.   Lab Results  Component Value Date   WBC 5.8 07/17/2017   HGB 11.2 (L) 08/08/2017   HCT 33.0 (L) 08/08/2017   MCV 92.6 07/17/2017   PLT 245 07/17/2017   Lab Results  Component Value Date   CREATININE 2.00 (H) 08/08/2017   BUN 31 (H) 08/08/2017   NA 140 08/08/2017   K 3.6 08/08/2017   CL 102 08/08/2017   CO2 30 (H) 07/17/2017   Lab Results  Component Value Date   ALT 15 07/17/2017   AST 18 07/17/2017   ALKPHOS 93 07/17/2017   BILITOT 0.5 07/17/2017   Lab Results  Component Value Date   CHOL 101 11/30/2013   HDL 43 11/30/2013   LDLCALC 44 11/30/2013   TRIG 70 11/30/2013   CHOLHDL 2.3 11/30/2013    Lab Results  Component Value Date   HGBA1C 7.8 (H) 11/30/2013    Assessment & Plan    1. CAD: S/p CABG x 3 (LIMA-LAD, SVG-OM, SVG-RCA) in 2004.  Myoview in 2012 and 2015 overall low risk. Stable with no anginal symptoms. No indication for ischemic evaluation.  Continue amlodipine, metoprolol, Maxide-25, valsartan, and Crestor.  2. Persistent/permanent atrial  fibrillation: Rate controlled.  Will update CMET, CBC today.  Continue metoprolol, Eliquis.  3. PAD: History of prior popliteal stenting.  Denies worsening claudication.  She is pending repeat ABIs in 08/2022.  Continue Crestor.  4. Carotid artery stenosis:  Carotid ultrasound in 08/2021 showed 60 to 79% R ICA stenosis, patent left distal CCA to mid/distal LICA stent  with stable velocities when compared to prior exam.  She is scheduled for repeat carotid ultrasound in 08/2022.  Continue Crestor.  5. Renal artery stenosis: S/p right renal artery stent.  Stable renal function.  BP well-controlled.  Per Dr. Victorino December prior notes, she is no longer requiring annual duplex ultrasound.  6. Hypertension: BP well controlled. Continue current antihypertensive regimen.   7. Hyperlipidemia: LDL was 62 on 01/2022.  Continue Crestor.  8. Type 2 diabetes: A1c was 5.6 in 05/2021. Monitored and managed per PCP.  9. CKD stage IIIb-4: Creatinine was stable at 1.42 in 08/2021.  Pending CMET as above.  10. Disposition: Follow-up with Dr. Sallyanne Kuster in 1 year, sooner if needed.     Lenna Sciara, NP 07/18/2022, 5:18 PM

## 2022-07-19 LAB — COMPREHENSIVE METABOLIC PANEL
ALT: 7 IU/L (ref 0–32)
AST: 16 IU/L (ref 0–40)
Albumin/Globulin Ratio: 1.7 (ref 1.2–2.2)
Albumin: 4.3 g/dL (ref 3.8–4.8)
Alkaline Phosphatase: 75 IU/L (ref 44–121)
BUN/Creatinine Ratio: 10 — ABNORMAL LOW (ref 12–28)
BUN: 16 mg/dL (ref 8–27)
Bilirubin Total: 0.3 mg/dL (ref 0.0–1.2)
CO2: 25 mmol/L (ref 20–29)
Calcium: 9.5 mg/dL (ref 8.7–10.3)
Chloride: 103 mmol/L (ref 96–106)
Creatinine, Ser: 1.61 mg/dL — ABNORMAL HIGH (ref 0.57–1.00)
Globulin, Total: 2.6 g/dL (ref 1.5–4.5)
Glucose: 97 mg/dL (ref 70–99)
Potassium: 4.5 mmol/L (ref 3.5–5.2)
Sodium: 142 mmol/L (ref 134–144)
Total Protein: 6.9 g/dL (ref 6.0–8.5)
eGFR: 34 mL/min/{1.73_m2} — ABNORMAL LOW (ref >59)

## 2022-07-19 LAB — CBC
Hematocrit: 37.8 % (ref 34.0–46.6)
Hemoglobin: 12.4 g/dL (ref 11.1–15.9)
MCH: 31.2 pg (ref 26.6–33.0)
MCHC: 32.8 g/dL (ref 31.5–35.7)
MCV: 95 fL (ref 79–97)
Platelets: 267 10*3/uL (ref 150–450)
RBC: 3.98 x10E6/uL (ref 3.77–5.28)
RDW: 13.3 % (ref 11.7–15.4)
WBC: 6.1 10*3/uL (ref 3.4–10.8)

## 2022-07-20 ENCOUNTER — Other Ambulatory Visit (HOSPITAL_COMMUNITY): Payer: Self-pay | Admitting: Cardiovascular Disease

## 2022-07-20 DIAGNOSIS — I739 Peripheral vascular disease, unspecified: Secondary | ICD-10-CM

## 2022-07-24 ENCOUNTER — Ambulatory Visit (HOSPITAL_BASED_OUTPATIENT_CLINIC_OR_DEPARTMENT_OTHER)
Admission: RE | Admit: 2022-07-24 | Discharge: 2022-07-24 | Disposition: A | Discharge status: Home / Self Care | Payer: Medicare PPO | Source: Ambulatory Visit | Attending: Cardiology | Admitting: Cardiology

## 2022-07-24 ENCOUNTER — Telehealth: Payer: Self-pay | Admitting: Cardiovascular Disease

## 2022-07-24 ENCOUNTER — Ambulatory Visit (HOSPITAL_COMMUNITY)
Admission: RE | Admit: 2022-07-24 | Discharge: 2022-07-24 | Disposition: A | Discharge status: Home / Self Care | Payer: Medicare PPO | Source: Ambulatory Visit | Attending: Cardiology | Admitting: Cardiology

## 2022-07-24 DIAGNOSIS — I6523 Occlusion and stenosis of bilateral carotid arteries: Secondary | ICD-10-CM | POA: Diagnosis not present

## 2022-07-24 DIAGNOSIS — I739 Peripheral vascular disease, unspecified: Secondary | ICD-10-CM | POA: Insufficient documentation

## 2022-07-24 MED ORDER — METOPROLOL TARTRATE 25 MG PO TABS: 25.0000 mg | ORAL_TABLET | Freq: Two times a day (BID) | ORAL | 3 refills | Status: DC

## 2022-07-24 NOTE — Telephone Encounter (Signed)
*  STAT* If patient is at the pharmacy, call can be transferred to refill team.   1. Which medications need to be refilled? (please list name of each medication and dose if known)  metoprolol tartrate (LOPRESSOR) 25 MG tablet   2. Which pharmacy/location (including street and city if local pharmacy) is medication to be sent to? [facility] [address]   3. Do they need a 30 day or 90 day supply? 90 day   Patient requesting [facility] to be removed. She will be using [facility] for all future refills.

## 2022-07-25 ENCOUNTER — Telehealth: Payer: Self-pay

## 2022-07-25 NOTE — Telephone Encounter (Signed)
Lmom to discuss lab results. Waiting on a return call.  

## 2022-07-26 ENCOUNTER — Telehealth: Payer: Self-pay | Admitting: Cardiovascular Disease

## 2022-07-26 LAB — VAS US ABI WITH/WO TBI
Left ABI: 0.85
Right ABI: 0.89

## 2022-07-26 NOTE — Telephone Encounter (Signed)
Returned pts call. Pt was notified of results. Pt will continue her current medication and follow up as planned.

## 2022-07-26 NOTE — Telephone Encounter (Signed)
Patient returned CMA's call.

## 2022-07-26 NOTE — Telephone Encounter (Signed)
LMTCB regarding lab results.  

## 2022-07-30 ENCOUNTER — Telehealth: Payer: Self-pay | Admitting: *Deleted

## 2022-07-30 DIAGNOSIS — I6523 Occlusion and stenosis of bilateral carotid arteries: Secondary | ICD-10-CM

## 2022-07-30 NOTE — Telephone Encounter (Signed)
-----  Message from Sanda Klein, MD sent at 07/24/2022  4:54 PM EST ----- The left carotid stent looks fine, but the narrowing on the right side is getting worse. Would like to refer her back to Dr. Trula Slade, who took care of the other carotid stent back in 2013.

## 2022-07-30 NOTE — Telephone Encounter (Signed)
Spoke with pt, aware of the results. Referral placed for VVS

## 2022-08-06 ENCOUNTER — Encounter: Payer: Self-pay | Admitting: Surgery

## 2022-08-06 ENCOUNTER — Ambulatory Visit: Payer: Medicare PPO | Admitting: Surgery

## 2022-08-06 VITALS — BP 125/83 | HR 57 | Temp 98.0°F | Resp 20 | Ht 65.0 in | Wt 176.0 lb

## 2022-08-06 DIAGNOSIS — I6523 Occlusion and stenosis of bilateral carotid arteries: Secondary | ICD-10-CM | POA: Diagnosis not present

## 2022-08-06 NOTE — Progress Notes (Signed)
[facility] [place]  Patient name: [name] MRN: [id number] DOB: 1950/11/15 Sex: female   REQUESTING PROVIDER:    Dr. Sallyanne Kuster   REASON FOR CONSULT:    carotid  HISTORY OF PRESENT ILLNESS:   [name] is a 72 y.o. female, who I placed a left carotid stent on 02/12/2012 for asymptomatic stenosis.  Stenting was recommended because of how high the bifurcation was.  The procedure was uncomplicated.  She is referred back today for progressive stenosis on the right side.  She remains asymptomatic.  Specifically, she denies numbness or weakness in either extremity.  Denies slurred speech.  She denies amaurosis fugax.  The patient has a history of coronary disease, status post CABG in 2024.  She has persistent atrial fibrillation on Eliquis.  She takes a statin for hypercholesterolemia.  She is a former smoker.  She has stage IIIb renal insufficiency she is medically managed for hypertension with an ARB.  PAST MEDICAL HISTORY    Past Medical History:  Diagnosis Date   Acute kidney failure (HCC)    ASHD (arteriosclerotic heart disease)    native vessel   Atrial fibrillation (HCC)    Breast cancer, left (HCC)    CAD (coronary artery disease) 2004   multivessel, status post CABG x3   Carotid artery disease ([place])    stent in 2013   CKD (chronic kidney disease), stage III (HCC)    Diabetes (HCC)    GERD (gastroesophageal reflux disease)    History of tobacco abuse    Hx of adenomatous polyp of colon 07/26/2016   Hyperlipidemia    Hypertension    Hyperthyroidism    Myocardial infarction [facility]) 2002   Peripheral vascular disease (HCC)    S/P popliteal artery and renal artery stents   Renovascular hypertension    S/P CABG x 3 2004   Thyroid goiter    Tobacco abuse    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     FAMILY HISTORY   Family History  Problem Relation Age of Onset   Hypertension Mother    Other Mother        DJD    Deep vein thrombosis Sister    Diabetes Sister    Heart disease Sister    Hyperlipidemia Sister    Hypertension Sister    Diabetes Brother    Other Brother        DJD   Hypertension Son    Hyperlipidemia Son    Colon cancer Neg Hx    Stomach cancer Neg Hx    Esophageal cancer Neg Hx    Rectal cancer Neg Hx    Liver cancer Neg Hx     SOCIAL HISTORY:   Social History   Socioeconomic History   Marital status: Married    Spouse name: Not on file   Number of children: 2   Years of education: 12   Highest education level: Not on file  Occupational History   Occupation: retired  Tobacco Use   Smoking status: Former    Packs/day: 1.00    Years: 30.00    Total pack years: 30.00    Types: Cigarettes    Quit date: 07/02/2001    Years since quitting: 21.1   Smokeless tobacco: Never  Vaping Use   Vaping Use: Never used  Substance and Sexual Activity   Alcohol use: Yes    Alcohol/week: 2.0 - 3.0 standard drinks of alcohol    Types: 2 - 3 Standard  drinks or equivalent per week    Comment: socially   Drug use: No   Sexual activity: Not on file  Other Topics Concern   Not on file  Social History Narrative   Pt lives with her husband and son.    Social Determinants of Health   Financial Resource Strain: Not on file  Food Insecurity: Not on file  Transportation Needs: Not on file  Physical Activity: Not on file  Stress: Not on file  Social Connections: Not on file  Intimate Partner Violence: Not on file    ALLERGIES:    No Known Allergies  CURRENT MEDICATIONS:    Current Outpatient Medications  Medication Sig Dispense Refill   amLODipine (NORVASC) 10 MG tablet TAKE 1 TABLET EVERY DAY 90 tablet 3   apixaban (ELIQUIS) 5 MG TABS tablet Take 1 tablet (5 mg total) by mouth 2 (two) times daily. Please keep upcoming appointment. 180 tablet 0   ketorolac (ACULAR) 0.5 % ophthalmic solution INSTILL 1 DROP IN LEFT EYE TWICE DAILY 5 mL 6   lansoprazole (PREVACID) 15 MG  capsule Take 1 capsule (15 mg total) by mouth daily. 90 capsule 3   metFORMIN (GLUCOPHAGE) 1000 MG tablet Take 1 tablet (1,000 mg total) by mouth 2 (two) times daily with a meal. 180 tablet 3   methimazole (TAPAZOLE) 10 MG tablet Take 5 mg by mouth daily.     metoprolol tartrate (LOPRESSOR) 25 MG tablet Take 1 tablet (25 mg total) by mouth 2 (two) times daily. 180 tablet 3   nitroGLYCERIN (NITROSTAT) 0.4 MG SL tablet Place 1 tablet (0.4 mg total) under the tongue every 5 (five) minutes as needed for chest pain. 25 tablet 6   rosuvastatin (CRESTOR) 10 MG tablet TAKE 1 TABLET EVERY DAY 90 tablet 2   triamterene-hydrochlorothiazide (MAXZIDE-25) 37.5-25 MG tablet TAKE 1 TABLET EVERY DAY 90 tablet 3   valsartan (DIOVAN) 160 MG tablet TAKE 1 TABLET BY MOUTH  TWICE DAILY 180 tablet 0   No current facility-administered medications for this visit.    REVIEW OF SYSTEMS:   '[X]'$  denotes positive finding, '[ ]'$  denotes negative finding Cardiac  Comments:  Chest pain or chest pressure:    Shortness of breath upon exertion:    Short of breath when lying flat:    Irregular heart rhythm:        Vascular    Pain in calf, thigh, or hip brought on by ambulation:    Pain in feet at night that wakes you up from your sleep:     Blood clot in your veins:    Leg swelling:         Pulmonary    Oxygen at home:    Productive cough:     Wheezing:         Neurologic    Sudden weakness in arms or legs:     Sudden numbness in arms or legs:     Sudden onset of difficulty speaking or slurred speech:    Temporary loss of vision in one eye:     Problems with dizziness:         Gastrointestinal    Blood in stool:      Vomited blood:         Genitourinary    Burning when urinating:     Blood in urine:        Psychiatric    Major depression:         Hematologic    Bleeding  problems:    Problems with blood clotting too easily:        Skin    Rashes or ulcers:        Constitutional    Fever or chills:      PHYSICAL EXAM:   Vitals:   08/06/22 0909 08/06/22 0911  BP: (!) 144/86 125/83  Pulse: (!) 57   Resp: 20   Temp: 98 F (36.7 C)   SpO2: 96%   Weight: 176 lb (79.8 kg)   Height: '5\' 5"'$  (1.651 m)     GENERAL: The patient is a well-nourished female, in no acute distress. The vital signs are documented above. CARDIAC: There is a regular rate and rhythm.  VASCULAR: No carotid bruits PULMONARY: Nonlabored respirations MUSCULOSKELETAL: There are no major deformities or cyanosis. NEUROLOGIC: No focal weakness or paresthesias are detected. SKIN: There are no ulcers or rashes noted. PSYCHIATRIC: The patient has a normal affect.  STUDIES:   I have reviewed the following: Right Carotid: Velocities in the right ICA are consistent now with a high  end-                range 60-79% stenosis.   Left Carotid: The ECA appears >50% stenosed. Slightly elevated velocity in                distal portion of distal CCA-distal ICA stent, otherwise  patent               stent.   Vertebrals:  Right vertebral artery demonstrates antegrade flow. Left  vertebral              artery demonstrates bidirectional flow.  Subclavians: Left subclavian artery flow was disturbed. Normal flow  hemodynamics              were seen in the right subclavian artery.   ASSESSMENT and PLAN   Asymptomatic carotid stenosis: Her stent on the left appears to be widely patent.  There has been progressive stenosis on the right side, approaching 80%.  I reviewed her CT scan from 10 years ago.  Her bifurcation is very high on the right.  I feel like she will need stenting on the right side however she is not over 80% so we will continue to monitor this.  She does have stage IIIb-4 renal insufficiency, and so I may only be able to get a noncontrasted CT scan in the future.  Have her schedule follow-up in 6 months.   Leia Alf, MD, FACS [facility] [phone] Pager [phone]

## 2022-08-08 ENCOUNTER — Other Ambulatory Visit: Payer: Self-pay

## 2022-08-08 DIAGNOSIS — I6523 Occlusion and stenosis of bilateral carotid arteries: Secondary | ICD-10-CM

## 2022-08-20 ENCOUNTER — Other Ambulatory Visit (HOSPITAL_COMMUNITY): Payer: Self-pay | Admitting: Cardiovascular Disease

## 2022-08-20 DIAGNOSIS — I739 Peripheral vascular disease, unspecified: Secondary | ICD-10-CM

## 2022-09-11 ENCOUNTER — Other Ambulatory Visit: Payer: Self-pay | Admitting: Cardiovascular Disease

## 2022-09-19 DIAGNOSIS — Z8679 Personal history of other diseases of the circulatory system: Secondary | ICD-10-CM | POA: Diagnosis not present

## 2022-09-19 DIAGNOSIS — N390 Urinary tract infection, site not specified: Secondary | ICD-10-CM | POA: Diagnosis not present

## 2022-09-19 DIAGNOSIS — N1832 Chronic kidney disease, stage 3b: Secondary | ICD-10-CM | POA: Diagnosis not present

## 2022-09-19 DIAGNOSIS — E1122 Type 2 diabetes mellitus with diabetic chronic kidney disease: Secondary | ICD-10-CM | POA: Diagnosis not present

## 2022-09-19 DIAGNOSIS — I129 Hypertensive chronic kidney disease with stage 1 through stage 4 chronic kidney disease, or unspecified chronic kidney disease: Secondary | ICD-10-CM | POA: Diagnosis not present

## 2022-10-01 DIAGNOSIS — I480 Paroxysmal atrial fibrillation: Secondary | ICD-10-CM | POA: Diagnosis not present

## 2022-10-01 DIAGNOSIS — N1832 Chronic kidney disease, stage 3b: Secondary | ICD-10-CM | POA: Diagnosis not present

## 2022-10-01 DIAGNOSIS — C50919 Malignant neoplasm of unspecified site of unspecified female breast: Secondary | ICD-10-CM | POA: Diagnosis not present

## 2022-10-01 DIAGNOSIS — R202 Paresthesia of skin: Secondary | ICD-10-CM | POA: Diagnosis not present

## 2022-10-01 DIAGNOSIS — I129 Hypertensive chronic kidney disease with stage 1 through stage 4 chronic kidney disease, or unspecified chronic kidney disease: Secondary | ICD-10-CM | POA: Diagnosis not present

## 2022-10-01 DIAGNOSIS — I6523 Occlusion and stenosis of bilateral carotid arteries: Secondary | ICD-10-CM | POA: Diagnosis not present

## 2022-10-01 DIAGNOSIS — E1159 Type 2 diabetes mellitus with other circulatory complications: Secondary | ICD-10-CM | POA: Diagnosis not present

## 2022-10-01 DIAGNOSIS — E785 Hyperlipidemia, unspecified: Secondary | ICD-10-CM | POA: Diagnosis not present

## 2022-10-12 ENCOUNTER — Other Ambulatory Visit: Payer: Self-pay

## 2022-10-12 MED ORDER — METOPROLOL TARTRATE 25 MG PO TABS: 25.0000 mg | ORAL_TABLET | Freq: Two times a day (BID) | ORAL | 3 refills | Status: DC

## 2022-10-12 NOTE — Telephone Encounter (Signed)
Pt's medication was sent to pt's pharmacy as requested. Confirmation received.  °

## 2022-10-15 DIAGNOSIS — N1832 Chronic kidney disease, stage 3b: Secondary | ICD-10-CM | POA: Diagnosis not present

## 2022-11-16 ENCOUNTER — Other Ambulatory Visit: Payer: Self-pay | Admitting: Cardiovascular Disease

## 2022-11-16 DIAGNOSIS — I480 Paroxysmal atrial fibrillation: Secondary | ICD-10-CM

## 2022-11-16 NOTE — Telephone Encounter (Signed)
Prescription refill request for Eliquis received. Indication: Afib  Last office visit: 07/18/22 ([place])  Scr: 1.61(07/18/22)  Age: 72 Weight: 79.8kg  Appropriate dose. Refill sent.

## 2023-02-11 ENCOUNTER — Ambulatory Visit (HOSPITAL_COMMUNITY): Payer: Medicare PPO

## 2023-02-11 ENCOUNTER — Ambulatory Visit: Payer: Medicare PPO | Admitting: Surgery

## 2023-03-27 DIAGNOSIS — E559 Vitamin D deficiency, unspecified: Secondary | ICD-10-CM | POA: Diagnosis not present

## 2023-03-27 DIAGNOSIS — I129 Hypertensive chronic kidney disease with stage 1 through stage 4 chronic kidney disease, or unspecified chronic kidney disease: Secondary | ICD-10-CM | POA: Diagnosis not present

## 2023-03-27 DIAGNOSIS — N1832 Chronic kidney disease, stage 3b: Secondary | ICD-10-CM | POA: Diagnosis not present

## 2023-03-27 DIAGNOSIS — N189 Chronic kidney disease, unspecified: Secondary | ICD-10-CM | POA: Diagnosis not present

## 2023-03-27 DIAGNOSIS — E785 Hyperlipidemia, unspecified: Secondary | ICD-10-CM | POA: Diagnosis not present

## 2023-03-27 DIAGNOSIS — E1122 Type 2 diabetes mellitus with diabetic chronic kidney disease: Secondary | ICD-10-CM | POA: Diagnosis not present

## 2023-05-13 ENCOUNTER — Ambulatory Visit (HOSPITAL_COMMUNITY)
Admission: RE | Admit: 2023-05-13 | Discharge: 2023-05-13 | Disposition: A | Discharge status: Home / Self Care | Payer: Medicare PPO | Source: Ambulatory Visit | Attending: Surgery | Admitting: Surgery

## 2023-05-13 ENCOUNTER — Encounter: Payer: Self-pay | Admitting: Surgery

## 2023-05-13 ENCOUNTER — Ambulatory Visit: Payer: Medicare PPO | Admitting: Surgery

## 2023-05-13 VITALS — BP 121/76 | HR 55 | Temp 97.8°F | Resp 20 | Ht 65.0 in | Wt 177.0 lb

## 2023-05-13 DIAGNOSIS — I6523 Occlusion and stenosis of bilateral carotid arteries: Secondary | ICD-10-CM | POA: Insufficient documentation

## 2023-05-13 NOTE — Progress Notes (Signed)
[facility] [place]  Patient name: [name] MRN: [id number] DOB: 05-Jul-1950 Sex: female   REASON FOR VISIT:    Follow-up  HISOTRY OF PRESENT ILLNESS:   [name] is a 72 y.o. female, who I placed a left carotid stent on 02/12/2012 for asymptomatic stenosis.  Stenting was recommended because of how high the bifurcation was.  The procedure was uncomplicated.  I saw her in February 2024 due to concerns of elevated velocities on the right side which were approaching but less than 80%.  Her left stent was widely patent.  She says that she is asymptomatic.  She denies numbness or weakness in either extremity.  She denies slurred speech.  She denies amaurosis fugax.  She has had 1 episode of near syncope.  This was about a month ago   The patient has a history of coronary disease, status post CABG in 2024.  She has persistent atrial fibrillation on Eliquis.  She takes a statin for hypercholesterolemia.  She is a former smoker.  She has stage IIIb renal insufficiency she is medically managed for hypertension with an ARB.    PAST MEDICAL HISTORY:   Past Medical History:  Diagnosis Date   Acute kidney failure (HCC)    ASHD (arteriosclerotic heart disease)    native vessel   Atrial fibrillation (HCC)    Breast cancer, left (HCC)    CAD (coronary artery disease) 2004   multivessel, status post CABG x3   Carotid artery disease (HCC)    stent in 2013   CKD (chronic kidney disease), stage III (HCC)    Diabetes (HCC)    GERD (gastroesophageal reflux disease)    History of tobacco abuse    Hx of adenomatous polyp of colon 07/26/2016   Hyperlipidemia    Hypertension    Hyperthyroidism    Myocardial infarction [facility]) 2002   Peripheral vascular disease (HCC)    S/P popliteal artery and renal artery stents   Renovascular hypertension    S/P CABG x 3 2004   Thyroid goiter    Tobacco abuse    Vaginal cancer (HCC) 1983      FAMILY HISTORY:   Family History  Problem Relation Age of Onset   Hypertension Mother    Other Mother        DJD   Deep vein thrombosis Sister    Diabetes Sister    Heart disease Sister    Hyperlipidemia Sister    Hypertension Sister    Diabetes Brother    Other Brother        DJD   Hypertension Son    Hyperlipidemia Son    Colon cancer Neg Hx    Stomach cancer Neg Hx    Esophageal cancer Neg Hx    Rectal cancer Neg Hx    Liver cancer Neg Hx     SOCIAL HISTORY:   Social History   Tobacco Use   Smoking status: Former    Current packs/day: 0.00    Average packs/day: 1 pack/day for 30.0 years (30.0 ttl pk-yrs)    Types: Cigarettes    Start date: 07/03/1971    Quit date: 07/02/2001    Years since quitting: 21.8   Smokeless tobacco: Never  Substance Use Topics   Alcohol use: Yes    Alcohol/week: 2.0 - 3.0 standard drinks of alcohol    Types: 2 - 3 Standard drinks or equivalent per week    Comment: socially     ALLERGIES:  No Known Allergies   CURRENT MEDICATIONS:   Current Outpatient Medications  Medication Sig Dispense Refill   amLODipine (NORVASC) 10 MG tablet TAKE 1 TABLET EVERY DAY 90 tablet 3   apixaban (ELIQUIS) 5 MG TABS tablet Take 1 tablet by mouth twice daily 180 tablet 1   ketorolac (ACULAR) 0.5 % ophthalmic solution INSTILL 1 DROP IN LEFT EYE TWICE DAILY 5 mL 6   lansoprazole (PREVACID) 15 MG capsule Take 1 capsule (15 mg total) by mouth daily. 90 capsule 3   metFORMIN (GLUCOPHAGE) 1000 MG tablet Take 1 tablet (1,000 mg total) by mouth 2 (two) times daily with a meal. 180 tablet 3   methimazole (TAPAZOLE) 10 MG tablet Take 5 mg by mouth daily.     metoprolol tartrate (LOPRESSOR) 25 MG tablet Take 1 tablet (25 mg total) by mouth 2 (two) times daily. 180 tablet 3   nitroGLYCERIN (NITROSTAT) 0.4 MG SL tablet Place 1 tablet (0.4 mg total) under the tongue every 5 (five) minutes as needed for chest pain. 25 tablet 6   rosuvastatin (CRESTOR) 10 MG  tablet TAKE 1 TABLET EVERY DAY 90 tablet 3   triamterene-hydrochlorothiazide (MAXZIDE-25) 37.5-25 MG tablet TAKE 1 TABLET EVERY DAY 90 tablet 3   valsartan (DIOVAN) 160 MG tablet TAKE 1 TABLET TWICE DAILY (NEED MD APPOINTMENT WITH CARDIOLOGIST) 180 tablet 3   No current facility-administered medications for this visit.    REVIEW OF SYSTEMS:   [X]  denotes positive finding, [ ]  denotes negative finding Cardiac  Comments:  Chest pain or chest pressure:    Shortness of breath upon exertion:    Short of breath when lying flat:    Irregular heart rhythm:        Vascular    Pain in calf, thigh, or hip brought on by ambulation:    Pain in feet at night that wakes you up from your sleep:     Blood clot in your veins:    Leg swelling:         Pulmonary    Oxygen at home:    Productive cough:     Wheezing:         Neurologic    Sudden weakness in arms or legs:     Sudden numbness in arms or legs:     Sudden onset of difficulty speaking or slurred speech:    Temporary loss of vision in one eye:     Problems with dizziness:         Gastrointestinal    Blood in stool:     Vomited blood:         Genitourinary    Burning when urinating:     Blood in urine:        Psychiatric    Major depression:         Hematologic    Bleeding problems:    Problems with blood clotting too easily:        Skin    Rashes or ulcers:        Constitutional    Fever or chills:      PHYSICAL EXAM:   Vitals:   05/13/23 1412 05/13/23 1414  BP: 124/74 121/76  Pulse: (!) 55   Resp: 20   Temp: 97.8 F (36.6 C)   SpO2: 97%   Weight: 177 lb (80.3 kg)   Height: 5\' 5"  (1.651 m)     GENERAL: The patient is a well-nourished female, in no acute distress. The vital signs are  documented above. CARDIAC: There is a regular rate and rhythm.  VASCULAR: No carotid bruits PULMONARY: Non-labored respirations  MUSCULOSKELETAL: There are no major deformities or cyanosis. NEUROLOGIC: No focal weakness or  paresthesias are detected. SKIN: There are no ulcers or rashes noted. PSYCHIATRIC: The patient has a normal affect.  STUDIES:   I have reviewed the following: Right Carotid: Velocities in the right ICA are consistent with a 80-99%                 stenosis. Low end of range   Left Carotid: Patent stent with increasing peak systolic velocity.  Velocity is                in the < 50% stenosis range.   Vertebrals:  Bilateral vertebral arteries demonstrate antegrade flow.  Subclavians: Normal flow hemodynamics were seen in bilateral subclavian               arteries.   MEDICAL ISSUES:   Likely asymptomatic right carotid stenosis: Based off imaging in 2014, she underwent left carotid stenting because her bifurcation was very high.  In my previous notes I have commented that the right sided bifurcation was very high as well.  Therefore most likely she will require right sided stenting.  In order to plan this out she will need to undergo a CT angiogram of the neck.  She does have some renal issues and so I will do this as a low-dose contrast study.  If her creatinine is too high to give contrast, I would still need a noncontrast scan to evaluate for the amount of calcium in the location of the bifurcation.  I will get this done in the near future and have her return for follow-up and surgical planning which will likely be a right sided TCAR    [name], IV, MD, FACS [facility] [phone] Pager [phone]

## 2023-05-21 DIAGNOSIS — E039 Hypothyroidism, unspecified: Secondary | ICD-10-CM | POA: Diagnosis not present

## 2023-05-21 DIAGNOSIS — I129 Hypertensive chronic kidney disease with stage 1 through stage 4 chronic kidney disease, or unspecified chronic kidney disease: Secondary | ICD-10-CM | POA: Diagnosis not present

## 2023-05-21 DIAGNOSIS — D509 Iron deficiency anemia, unspecified: Secondary | ICD-10-CM | POA: Diagnosis not present

## 2023-05-21 DIAGNOSIS — E785 Hyperlipidemia, unspecified: Secondary | ICD-10-CM | POA: Diagnosis not present

## 2023-05-21 DIAGNOSIS — N1832 Chronic kidney disease, stage 3b: Secondary | ICD-10-CM | POA: Diagnosis not present

## 2023-05-21 DIAGNOSIS — E1159 Type 2 diabetes mellitus with other circulatory complications: Secondary | ICD-10-CM | POA: Diagnosis not present

## 2023-05-21 DIAGNOSIS — Z1212 Encounter for screening for malignant neoplasm of rectum: Secondary | ICD-10-CM | POA: Diagnosis not present

## 2023-05-28 DIAGNOSIS — I129 Hypertensive chronic kidney disease with stage 1 through stage 4 chronic kidney disease, or unspecified chronic kidney disease: Secondary | ICD-10-CM | POA: Diagnosis not present

## 2023-05-28 DIAGNOSIS — Z0000 Encounter for general adult medical examination without abnormal findings: Secondary | ICD-10-CM | POA: Diagnosis not present

## 2023-05-28 DIAGNOSIS — Z1331 Encounter for screening for depression: Secondary | ICD-10-CM | POA: Diagnosis not present

## 2023-05-28 DIAGNOSIS — E039 Hypothyroidism, unspecified: Secondary | ICD-10-CM | POA: Diagnosis not present

## 2023-05-28 DIAGNOSIS — D509 Iron deficiency anemia, unspecified: Secondary | ICD-10-CM | POA: Diagnosis not present

## 2023-05-28 DIAGNOSIS — C50919 Malignant neoplasm of unspecified site of unspecified female breast: Secondary | ICD-10-CM | POA: Diagnosis not present

## 2023-05-28 DIAGNOSIS — E1159 Type 2 diabetes mellitus with other circulatory complications: Secondary | ICD-10-CM | POA: Diagnosis not present

## 2023-05-28 DIAGNOSIS — I480 Paroxysmal atrial fibrillation: Secondary | ICD-10-CM | POA: Diagnosis not present

## 2023-05-28 DIAGNOSIS — K219 Gastro-esophageal reflux disease without esophagitis: Secondary | ICD-10-CM | POA: Diagnosis not present

## 2023-05-28 DIAGNOSIS — E785 Hyperlipidemia, unspecified: Secondary | ICD-10-CM | POA: Diagnosis not present

## 2023-05-28 DIAGNOSIS — N1832 Chronic kidney disease, stage 3b: Secondary | ICD-10-CM | POA: Diagnosis not present

## 2023-05-28 DIAGNOSIS — R82998 Other abnormal findings in urine: Secondary | ICD-10-CM | POA: Diagnosis not present

## 2023-05-28 DIAGNOSIS — N390 Urinary tract infection, site not specified: Secondary | ICD-10-CM | POA: Diagnosis not present

## 2023-05-28 DIAGNOSIS — I25718 Atherosclerosis of autologous vein coronary artery bypass graft(s) with other forms of angina pectoris: Secondary | ICD-10-CM | POA: Diagnosis not present

## 2023-05-28 DIAGNOSIS — Z1339 Encounter for screening examination for other mental health and behavioral disorders: Secondary | ICD-10-CM | POA: Diagnosis not present

## 2023-05-28 DIAGNOSIS — Z1212 Encounter for screening for malignant neoplasm of rectum: Secondary | ICD-10-CM | POA: Diagnosis not present

## 2023-05-28 DIAGNOSIS — Z23 Encounter for immunization: Secondary | ICD-10-CM | POA: Diagnosis not present

## 2023-06-10 ENCOUNTER — Other Ambulatory Visit: Payer: Self-pay

## 2023-06-10 ENCOUNTER — Other Ambulatory Visit: Payer: Self-pay | Admitting: Cardiovascular Disease

## 2023-06-10 DIAGNOSIS — I6523 Occlusion and stenosis of bilateral carotid arteries: Secondary | ICD-10-CM

## 2023-06-10 DIAGNOSIS — I480 Paroxysmal atrial fibrillation: Secondary | ICD-10-CM

## 2023-06-10 NOTE — Telephone Encounter (Signed)
Eliquis 5mg  refill request received. Patient is 72 years old, weight-80.3kg, Crea-1.61 on 07/18/22, Diagnosis-Afib, and last seen by Bernadene Person on 07/18/22. Dose is appropriate based on dosing criteria. Will send in refill to requested pharmacy.

## 2023-06-28 ENCOUNTER — Ambulatory Visit
Admission: RE | Admit: 2023-06-28 | Discharge: 2023-06-28 | Disposition: A | Discharge status: Home / Self Care | Payer: Medicare PPO | Source: Ambulatory Visit | Attending: Surgery | Admitting: Surgery

## 2023-06-28 DIAGNOSIS — I651 Occlusion and stenosis of basilar artery: Secondary | ICD-10-CM | POA: Diagnosis not present

## 2023-06-28 DIAGNOSIS — I700 Atherosclerosis of aorta: Secondary | ICD-10-CM | POA: Diagnosis not present

## 2023-06-28 DIAGNOSIS — I6523 Occlusion and stenosis of bilateral carotid arteries: Secondary | ICD-10-CM | POA: Diagnosis not present

## 2023-06-28 DIAGNOSIS — I6502 Occlusion and stenosis of left vertebral artery: Secondary | ICD-10-CM | POA: Diagnosis not present

## 2023-06-28 MED ORDER — IOPAMIDOL (ISOVUE-370) INJECTION 76%
60.0000 mL | Freq: Once | INTRAVENOUS | Status: AC | PRN
  Administered 2023-06-28: 60 mL via INTRAVENOUS

## 2023-07-08 ENCOUNTER — Ambulatory Visit: Payer: Medicare PPO | Admitting: Surgery

## 2023-07-08 ENCOUNTER — Encounter: Payer: Self-pay | Admitting: Surgery

## 2023-07-08 VITALS — BP 127/83 | HR 60 | Temp 98.0°F | Resp 20 | Ht 65.0 in | Wt 178.0 lb

## 2023-07-08 DIAGNOSIS — I6523 Occlusion and stenosis of bilateral carotid arteries: Secondary | ICD-10-CM | POA: Diagnosis not present

## 2023-07-08 MED ORDER — CLOPIDOGREL BISULFATE 75 MG PO TABS: 75.0000 mg | ORAL_TABLET | Freq: Every day | ORAL | 6 refills | Status: DC

## 2023-07-08 MED ORDER — ASPIRIN 81 MG PO TBEC: 81.0000 mg | DELAYED_RELEASE_TABLET | Freq: Every day | ORAL | 12 refills | Status: AC

## 2023-07-08 NOTE — Progress Notes (Signed)
 [facility] [place]  Patient name: [name] MRN: [id number] DOB: 06-15-1951 Sex: female   REASON FOR VISIT:    Follow up  HISOTRY OF PRESENT ILLNESS:    [name] is a 73 y.o. female, who I placed a left carotid stent on 02/12/2012 for asymptomatic stenosis.  Stenting was recommended because of how high the bifurcation was.  The procedure was uncomplicated.  I saw her in February 2024 due to concerns of elevated velocities on the right side which were approaching but less than 80%.  Her left stent was widely patent.  She says that she is asymptomatic.  She denies numbness or weakness in either extremity.  She denies slurred speech.  She denies amaurosis fugax.  She has had 1 episode of near syncope.  This was in October.  Her ultrasound suggested greater than 80% right carotid stenosis.  Because her bifurcation was high on the other side, I elected to send her for CT scan to determine her appropriateness for intervention.  She is back for follow-up   The patient has a history of coronary disease, status post CABG in 2024.  She has persistent atrial fibrillation on Eliquis.  She takes a statin for hypercholesterolemia.  She is a former smoker.  She has stage IIIb renal insufficiency she is medically managed for hypertension with an ARB.   PAST MEDICAL HISTORY:   Past Medical History:  Diagnosis Date   Acute kidney failure (HCC)    ASHD (arteriosclerotic heart disease)    native vessel   Atrial fibrillation (HCC)    Breast cancer, left (HCC)    CAD (coronary artery disease) 2004   multivessel, status post CABG x3   Carotid artery disease (HCC)    stent in 2013   CKD (chronic kidney disease), stage III (HCC)    Diabetes (HCC)    GERD (gastroesophageal reflux disease)    History of tobacco abuse    Hx of adenomatous polyp of colon 07/26/2016   Hyperlipidemia    Hypertension    Hyperthyroidism    Myocardial  infarction [facility]) 2002   Peripheral vascular disease (HCC)    S/P popliteal artery and renal artery stents   Renovascular hypertension    S/P CABG x 3 2004   Thyroid goiter    Tobacco abuse    Vaginal cancer (HCC) 1983     FAMILY HISTORY:   Family History  Problem Relation Age of Onset   Hypertension Mother    Other Mother        DJD   Deep vein thrombosis Sister    Diabetes Sister    Heart disease Sister    Hyperlipidemia Sister    Hypertension Sister    Diabetes Brother    Other Brother        DJD   Hypertension Son    Hyperlipidemia Son    Colon cancer Neg Hx    Stomach cancer Neg Hx    Esophageal cancer Neg Hx    Rectal cancer Neg Hx    Liver cancer Neg Hx     SOCIAL HISTORY:   Social History   Tobacco Use   Smoking status: Former    Current packs/day: 0.00    Average packs/day: 1 pack/day for 30.0 years (30.0 ttl pk-yrs)    Types: Cigarettes    Start date: 07/03/1971    Quit date: 07/02/2001    Years since quitting: 22.0   Smokeless tobacco: Never  Substance Use Topics  Alcohol use: Yes    Alcohol/week: 2.0 - 3.0 standard drinks of alcohol    Types: 2 - 3 Standard drinks or equivalent per week    Comment: socially     ALLERGIES:   No Known Allergies   CURRENT MEDICATIONS:   Current Outpatient Medications  Medication Sig Dispense Refill   amLODipine (NORVASC) 10 MG tablet TAKE 1 TABLET EVERY DAY 90 tablet 3   apixaban (ELIQUIS) 5 MG TABS tablet Take 1 tablet by mouth twice daily 180 tablet 1   ketorolac (ACULAR) 0.5 % ophthalmic solution INSTILL 1 DROP IN LEFT EYE TWICE DAILY 5 mL 6   lansoprazole (PREVACID) 15 MG capsule Take 1 capsule (15 mg total) by mouth daily. 90 capsule 3   metFORMIN (GLUCOPHAGE) 1000 MG tablet Take 1 tablet (1,000 mg total) by mouth 2 (two) times daily with a meal. 180 tablet 3   methimazole (TAPAZOLE) 10 MG tablet Take 5 mg by mouth daily.     metoprolol tartrate (LOPRESSOR) 25 MG tablet Take 1 tablet (25 mg total) by  mouth 2 (two) times daily. 180 tablet 3   nitroGLYCERIN (NITROSTAT) 0.4 MG SL tablet Place 1 tablet (0.4 mg total) under the tongue every 5 (five) minutes as needed for chest pain. 25 tablet 6   rosuvastatin (CRESTOR) 10 MG tablet TAKE 1 TABLET EVERY DAY 90 tablet 3   triamterene-hydrochlorothiazide (MAXZIDE-25) 37.5-25 MG tablet TAKE 1 TABLET EVERY DAY 90 tablet 3   valsartan (DIOVAN) 160 MG tablet TAKE 1 TABLET TWICE DAILY (NEED MD APPOINTMENT WITH CARDIOLOGIST) 180 tablet 3   No current facility-administered medications for this visit.    REVIEW OF SYSTEMS:   [X]  denotes positive finding, [ ]  denotes negative finding Cardiac  Comments:  Chest pain or chest pressure:    Shortness of breath upon exertion:    Short of breath when lying flat:    Irregular heart rhythm:        Vascular    Pain in calf, thigh, or hip brought on by ambulation:    Pain in feet at night that wakes you up from your sleep:     Blood clot in your veins:    Leg swelling:         Pulmonary    Oxygen at home:    Productive cough:     Wheezing:         Neurologic    Sudden weakness in arms or legs:     Sudden numbness in arms or legs:     Sudden onset of difficulty speaking or slurred speech:    Temporary loss of vision in one eye:     Problems with dizziness:         Gastrointestinal    Blood in stool:     Vomited blood:         Genitourinary    Burning when urinating:     Blood in urine:        Psychiatric    Major depression:         Hematologic    Bleeding problems:    Problems with blood clotting too easily:        Skin    Rashes or ulcers:        Constitutional    Fever or chills:      PHYSICAL EXAM:   There were no vitals filed for this visit.  GENERAL: The patient is a well-nourished female, in no acute distress. The vital signs are  documented above. CARDIAC: There is a regular rate and rhythm.  PULMONARY: Non-labored respirations  MUSCULOSKELETAL: There are no major  deformities or cyanosis. NEUROLOGIC: No focal weakness or paresthesias are detected. SKIN: There are no ulcers or rashes noted. PSYCHIATRIC: The patient has a normal affect.  STUDIES:   I have reviewed the following U/S: Right Carotid: Velocities in the right ICA are consistent with a 80-99%                 stenosis. Low end of range   Left Carotid: Patent stent with increasing peak systolic velocity.  Velocity is                in the < 50% stenosis range.   Vertebrals:  Bilateral vertebral arteries demonstrate antegrade flow.  Subclavians: Normal flow hemodynamics were seen in bilateral subclavian               arteries.    Her CT scan has not been formally read however her bifurcation does appear to be high MEDICAL ISSUES:   Asymptomatic right carotid stenosis: I discussed our treatment options including continued medical therapy, carotid endarterectomy, and carotid stenting.  Because of the high location of her bifurcation, I feel it is more appropriate to proceed with right sided TCAR.  I discussed the risks and benefits of the procedure, including the risk of bleeding and stroke.  All of her questions were answered.  She is on Eliquis.  I will need to start her on aspirin and Plavix.  This will require her to be on triple therapy for a month before transitioning to aspirin and Eliquis.    Charlena Cross, MD, FACS [facility] [phone] Pager [phone]

## 2023-07-08 NOTE — H&P (View-Only) (Signed)
[facility] [place]  Patient name: [name] MRN: [id number] DOB: 06-15-1951 Sex: female   REASON FOR VISIT:    Follow up  HISOTRY OF PRESENT ILLNESS:    [name] is a 73 y.o. female, who I placed a left carotid stent on 02/12/2012 for asymptomatic stenosis.  Stenting was recommended because of how high the bifurcation was.  The procedure was uncomplicated.  I saw her in February 2024 due to concerns of elevated velocities on the right side which were approaching but less than 80%.  Her left stent was widely patent.  She says that she is asymptomatic.  She denies numbness or weakness in either extremity.  She denies slurred speech.  She denies amaurosis fugax.  She has had 1 episode of near syncope.  This was in October.  Her ultrasound suggested greater than 80% right carotid stenosis.  Because her bifurcation was high on the other side, I elected to send her for CT scan to determine her appropriateness for intervention.  She is back for follow-up   The patient has a history of coronary disease, status post CABG in 2024.  She has persistent atrial fibrillation on Eliquis.  She takes a statin for hypercholesterolemia.  She is a former smoker.  She has stage IIIb renal insufficiency she is medically managed for hypertension with an ARB.   PAST MEDICAL HISTORY:   Past Medical History:  Diagnosis Date   Acute kidney failure (HCC)    ASHD (arteriosclerotic heart disease)    native vessel   Atrial fibrillation (HCC)    Breast cancer, left (HCC)    CAD (coronary artery disease) 2004   multivessel, status post CABG x3   Carotid artery disease (HCC)    stent in 2013   CKD (chronic kidney disease), stage III (HCC)    Diabetes (HCC)    GERD (gastroesophageal reflux disease)    History of tobacco abuse    Hx of adenomatous polyp of colon 07/26/2016   Hyperlipidemia    Hypertension    Hyperthyroidism    Myocardial  infarction [facility]) 2002   Peripheral vascular disease (HCC)    S/P popliteal artery and renal artery stents   Renovascular hypertension    S/P CABG x 3 2004   Thyroid goiter    Tobacco abuse    Vaginal cancer (HCC) 1983     FAMILY HISTORY:   Family History  Problem Relation Age of Onset   Hypertension Mother    Other Mother        DJD   Deep vein thrombosis Sister    Diabetes Sister    Heart disease Sister    Hyperlipidemia Sister    Hypertension Sister    Diabetes Brother    Other Brother        DJD   Hypertension Son    Hyperlipidemia Son    Colon cancer Neg Hx    Stomach cancer Neg Hx    Esophageal cancer Neg Hx    Rectal cancer Neg Hx    Liver cancer Neg Hx     SOCIAL HISTORY:   Social History   Tobacco Use   Smoking status: Former    Current packs/day: 0.00    Average packs/day: 1 pack/day for 30.0 years (30.0 ttl pk-yrs)    Types: Cigarettes    Start date: 07/03/1971    Quit date: 07/02/2001    Years since quitting: 22.0   Smokeless tobacco: Never  Substance Use Topics  Alcohol use: Yes    Alcohol/week: 2.0 - 3.0 standard drinks of alcohol    Types: 2 - 3 Standard drinks or equivalent per week    Comment: socially     ALLERGIES:   No Known Allergies   CURRENT MEDICATIONS:   Current Outpatient Medications  Medication Sig Dispense Refill   amLODipine (NORVASC) 10 MG tablet TAKE 1 TABLET EVERY DAY 90 tablet 3   apixaban (ELIQUIS) 5 MG TABS tablet Take 1 tablet by mouth twice daily 180 tablet 1   ketorolac (ACULAR) 0.5 % ophthalmic solution INSTILL 1 DROP IN LEFT EYE TWICE DAILY 5 mL 6   lansoprazole (PREVACID) 15 MG capsule Take 1 capsule (15 mg total) by mouth daily. 90 capsule 3   metFORMIN (GLUCOPHAGE) 1000 MG tablet Take 1 tablet (1,000 mg total) by mouth 2 (two) times daily with a meal. 180 tablet 3   methimazole (TAPAZOLE) 10 MG tablet Take 5 mg by mouth daily.     metoprolol tartrate (LOPRESSOR) 25 MG tablet Take 1 tablet (25 mg total) by  mouth 2 (two) times daily. 180 tablet 3   nitroGLYCERIN (NITROSTAT) 0.4 MG SL tablet Place 1 tablet (0.4 mg total) under the tongue every 5 (five) minutes as needed for chest pain. 25 tablet 6   rosuvastatin (CRESTOR) 10 MG tablet TAKE 1 TABLET EVERY DAY 90 tablet 3   triamterene-hydrochlorothiazide (MAXZIDE-25) 37.5-25 MG tablet TAKE 1 TABLET EVERY DAY 90 tablet 3   valsartan (DIOVAN) 160 MG tablet TAKE 1 TABLET TWICE DAILY (NEED MD APPOINTMENT WITH CARDIOLOGIST) 180 tablet 3   No current facility-administered medications for this visit.    REVIEW OF SYSTEMS:   [X]  denotes positive finding, [ ]  denotes negative finding Cardiac  Comments:  Chest pain or chest pressure:    Shortness of breath upon exertion:    Short of breath when lying flat:    Irregular heart rhythm:        Vascular    Pain in calf, thigh, or hip brought on by ambulation:    Pain in feet at night that wakes you up from your sleep:     Blood clot in your veins:    Leg swelling:         Pulmonary    Oxygen at home:    Productive cough:     Wheezing:         Neurologic    Sudden weakness in arms or legs:     Sudden numbness in arms or legs:     Sudden onset of difficulty speaking or slurred speech:    Temporary loss of vision in one eye:     Problems with dizziness:         Gastrointestinal    Blood in stool:     Vomited blood:         Genitourinary    Burning when urinating:     Blood in urine:        Psychiatric    Major depression:         Hematologic    Bleeding problems:    Problems with blood clotting too easily:        Skin    Rashes or ulcers:        Constitutional    Fever or chills:      PHYSICAL EXAM:   There were no vitals filed for this visit.  GENERAL: The patient is a well-nourished female, in no acute distress. The vital signs are  documented above. CARDIAC: There is a regular rate and rhythm.  PULMONARY: Non-labored respirations  MUSCULOSKELETAL: There are no major  deformities or cyanosis. NEUROLOGIC: No focal weakness or paresthesias are detected. SKIN: There are no ulcers or rashes noted. PSYCHIATRIC: The patient has a normal affect.  STUDIES:   I have reviewed the following U/S: Right Carotid: Velocities in the right ICA are consistent with a 80-99%                 stenosis. Low end of range   Left Carotid: Patent stent with increasing peak systolic velocity.  Velocity is                in the < 50% stenosis range.   Vertebrals:  Bilateral vertebral arteries demonstrate antegrade flow.  Subclavians: Normal flow hemodynamics were seen in bilateral subclavian               arteries.    Her CT scan has not been formally read however her bifurcation does appear to be high MEDICAL ISSUES:   Asymptomatic right carotid stenosis: I discussed our treatment options including continued medical therapy, carotid endarterectomy, and carotid stenting.  Because of the high location of her bifurcation, I feel it is more appropriate to proceed with right sided TCAR.  I discussed the risks and benefits of the procedure, including the risk of bleeding and stroke.  All of her questions were answered.  She is on Eliquis.  I will need to start her on aspirin and Plavix.  This will require her to be on triple therapy for a month before transitioning to aspirin and Eliquis.    Charlena Cross, MD, FACS [facility] [phone] Pager [phone]

## 2023-07-11 ENCOUNTER — Other Ambulatory Visit: Payer: Self-pay | Admitting: *Deleted

## 2023-07-11 ENCOUNTER — Telehealth: Payer: Self-pay

## 2023-07-11 DIAGNOSIS — I6523 Occlusion and stenosis of bilateral carotid arteries: Secondary | ICD-10-CM

## 2023-07-11 NOTE — Telephone Encounter (Signed)
 Attempted to reach patient to schedule right TCAR. Left VM for patient to return call.

## 2023-07-17 DIAGNOSIS — H35072 Retinal telangiectasis, left eye: Secondary | ICD-10-CM | POA: Diagnosis not present

## 2023-07-17 DIAGNOSIS — H35352 Cystoid macular degeneration, left eye: Secondary | ICD-10-CM | POA: Diagnosis not present

## 2023-07-17 DIAGNOSIS — H2511 Age-related nuclear cataract, right eye: Secondary | ICD-10-CM | POA: Diagnosis not present

## 2023-07-18 ENCOUNTER — Encounter (HOSPITAL_COMMUNITY)
Admission: RE | Admit: 2023-07-18 | Discharge: 2023-07-18 | Disposition: A | Discharge status: Home / Self Care | Payer: Medicare PPO | Source: Ambulatory Visit | Attending: Surgery | Admitting: Surgery

## 2023-07-18 ENCOUNTER — Other Ambulatory Visit: Payer: Self-pay

## 2023-07-18 ENCOUNTER — Encounter (HOSPITAL_COMMUNITY): Payer: Self-pay

## 2023-07-18 VITALS — BP 129/65 | HR 65 | Temp 98.1°F | Resp 17 | Ht 66.0 in | Wt 177.7 lb

## 2023-07-18 DIAGNOSIS — Z01818 Encounter for other preprocedural examination: Secondary | ICD-10-CM | POA: Insufficient documentation

## 2023-07-18 DIAGNOSIS — I6523 Occlusion and stenosis of bilateral carotid arteries: Secondary | ICD-10-CM | POA: Diagnosis not present

## 2023-07-18 LAB — CBC
HCT: 39.6 % (ref 36.0–46.0)
Hemoglobin: 13 g/dL (ref 12.0–15.0)
MCH: 31.4 pg (ref 26.0–34.0)
MCHC: 32.8 g/dL (ref 30.0–36.0)
MCV: 95.7 fL (ref 80.0–100.0)
Platelets: 259 10*3/uL (ref 150–400)
RBC: 4.14 MIL/uL (ref 3.87–5.11)
RDW: 14.4 % (ref 11.5–15.5)
WBC: 4.8 10*3/uL (ref 4.0–10.5)
nRBC: 0 % (ref 0.0–0.2)

## 2023-07-18 LAB — URINALYSIS, ROUTINE W REFLEX MICROSCOPIC
Bacteria, UA: NONE SEEN
Bilirubin Urine: NEGATIVE
Glucose, UA: NEGATIVE mg/dL
Ketones, ur: NEGATIVE mg/dL
Leukocytes,Ua: NEGATIVE
Nitrite: NEGATIVE
Protein, ur: 30 mg/dL — AB
Specific Gravity, Urine: 1.013 (ref 1.005–1.030)
pH: 6 (ref 5.0–8.0)

## 2023-07-18 LAB — HEMOGLOBIN A1C
Hgb A1c MFr Bld: 6.5 % — ABNORMAL HIGH (ref 4.8–5.6)
Mean Plasma Glucose: 139.85 mg/dL

## 2023-07-18 LAB — GLUCOSE, CAPILLARY: Glucose-Capillary: 95 mg/dL (ref 70–99)

## 2023-07-18 LAB — COMPREHENSIVE METABOLIC PANEL
ALT: 14 U/L (ref 0–44)
AST: 20 U/L (ref 15–41)
Albumin: 3.7 g/dL (ref 3.5–5.0)
Alkaline Phosphatase: 61 U/L (ref 38–126)
Anion gap: 8 (ref 5–15)
BUN: 20 mg/dL (ref 8–23)
CO2: 28 mmol/L (ref 22–32)
Calcium: 10 mg/dL (ref 8.9–10.3)
Chloride: 105 mmol/L (ref 98–111)
Creatinine, Ser: 1.65 mg/dL — ABNORMAL HIGH (ref 0.44–1.00)
GFR, Estimated: 33 mL/min — ABNORMAL LOW (ref >60)
Glucose, Bld: 120 mg/dL — ABNORMAL HIGH (ref 70–99)
Potassium: 3.6 mmol/L (ref 3.5–5.1)
Sodium: 141 mmol/L (ref 135–145)
Total Bilirubin: 0.9 mg/dL (ref 0.0–1.2)
Total Protein: 7.2 g/dL (ref 6.5–8.1)

## 2023-07-18 LAB — TYPE AND SCREEN
ABO/RH(D): O POS
Antibody Screen: NEGATIVE

## 2023-07-18 LAB — SURGICAL PCR SCREEN
MRSA, PCR: NEGATIVE
Staphylococcus aureus: NEGATIVE

## 2023-07-18 LAB — APTT: aPTT: 42 s — ABNORMAL HIGH (ref 24–36)

## 2023-07-18 LAB — PROTIME-INR
INR: 1.5 — ABNORMAL HIGH (ref 0.8–1.2)
Prothrombin Time: 17.8 s — ABNORMAL HIGH (ref 11.4–15.2)

## 2023-07-18 NOTE — Progress Notes (Signed)
Surgical Instructions   Your procedure is scheduled on Wednesday, January 22nd, 2025. Report to [facility] Main Entrance "A" at 5:30 A.M., then check in with the Admitting office. Any questions or running late day of surgery: call [phone]  Questions prior to your surgery date: call [phone], Monday-Friday, 8am-4pm. If you experience any cold or flu symptoms such as cough, fever, chills, shortness of breath, etc. between now and your scheduled surgery, please notify us at the above number.     Remember:  Do not eat or drink after midnight the night before your surgery    Take these medicines the morning of surgery with A SIP OF WATER: Amlodipine (Norvasc) Aspirin Clopidogrel (Plavix) Lansoprazole (Prevacid) Methimazole (Tapazole) Metoprolol Tartrate (Lopressor) Rosuvastatin (Crestor)    Per your surgeon's instruction, hold your Apixaban (Eliquis) for 72 hours prior to your surgery.  Your last dose of Eliquis should be on 07/20/23 (Saturday).      One week prior to surgery, STOP taking any Aleve, Naproxen, Ibuprofen, Motrin, Advil, Goody's, BC's, all herbal medications, fish oil, and non-prescription vitamins.   WHAT DO I DO ABOUT MY DIABETES MEDICATION?   Do not take oral diabetes medicines (pills) the morning of surgery.  Do not take Metformin on the day of surgery.     HOW TO MANAGE YOUR DIABETES BEFORE AND AFTER SURGERY  Why is it important to control my blood sugar before and after surgery? Improving blood sugar levels before and after surgery helps healing and can limit problems. A way of improving blood sugar control is eating a healthy diet by:  Eating less sugar and carbohydrates  Increasing activity/exercise  Talking with your doctor about reaching your blood sugar goals High blood sugars (greater than 180 mg/dL) can raise your risk of infections and slow your recovery, so you will need to focus on controlling your diabetes during the weeks before  surgery. Make sure that the doctor who takes care of your diabetes knows about your planned surgery including the date and location.  How do I manage my blood sugar before surgery? Check your blood sugar at least 4 times a day, starting 2 days before surgery, to make sure that the level is not too high or low.  Check your blood sugar the morning of your surgery when you wake up and every 2 hours until you get to the Short Stay unit.  If your blood sugar is less than 70 mg/dL, you will need to treat for low blood sugar: Do not take insulin. Treat a low blood sugar (less than 70 mg/dL) with  cup of clear juice (cranberry or apple), 4 glucose tablets, OR glucose gel. Recheck blood sugar in 15 minutes after treatment (to make sure it is greater than 70 mg/dL). If your blood sugar is not greater than 70 mg/dL on recheck, call [phone] for further instructions. Report your blood sugar to the short stay nurse when you get to Short Stay.  If you are admitted to the hospital after surgery: Your blood sugar will be checked by the staff and you will probably be given insulin after surgery (instead of oral diabetes medicines) to make sure you have good blood sugar levels. The goal for blood sugar control after surgery is 80-180 mg/dL.                      Do NOT Smoke (Tobacco/Vaping) for 24 hours prior to your procedure.  If you use a CPAP at night, you may  bring your mask/headgear for your overnight stay.   You will be asked to remove any contacts, glasses, piercing's, hearing aid's, dentures/partials prior to surgery. Please bring cases for these items if needed.    Patients discharged the day of surgery will not be allowed to drive home, and someone needs to stay with them for 24 hours.  SURGICAL WAITING ROOM VISITATION Patients may have no more than 2 support people in the waiting area - these visitors may rotate.   Pre-op nurse will coordinate an appropriate time for 1 ADULT support  person, who may not rotate, to accompany patient in pre-op.  Children under the age of 53 must have an adult with them who is not the patient and must remain in the main waiting area with an adult.  If the patient needs to stay at the hospital during part of their recovery, the visitor guidelines for inpatient rooms apply.  Please refer to the [facility] website for the visitor guidelines for any additional information.   If you received a COVID test during your pre-op visit  it is requested that you wear a mask when out in public, stay away from anyone that may not be feeling well and notify your surgeon if you develop symptoms. If you have been in contact with anyone that has tested positive in the last 10 days please notify you surgeon.      Pre-operative CHG Bathing Instructions   You can play a key role in reducing the risk of infection after surgery. Your skin needs to be as free of germs as possible. You can reduce the number of germs on your skin by washing with CHG (chlorhexidine gluconate) soap before surgery. CHG is an antiseptic soap that kills germs and continues to kill germs even after washing.   DO NOT use if you have an allergy to chlorhexidine/CHG or antibacterial soaps. If your skin becomes reddened or irritated, stop using the CHG and notify one of our RNs at [phone].              TAKE A SHOWER THE NIGHT BEFORE SURGERY AND THE DAY OF SURGERY    Please keep in mind the following:  DO NOT shave, including legs and underarms, 48 hours prior to surgery.   You may shave your face before/day of surgery.  Place clean sheets on your bed the night before surgery Use a clean washcloth (not used since being washed) for each shower. DO NOT sleep with pet's night before surgery.  CHG Shower Instructions:  Wash your face and private area with normal soap. If you choose to wash your hair, wash first with your normal shampoo.  After you use shampoo/soap, rinse your hair and  body thoroughly to remove shampoo/soap residue.  Turn the water OFF and apply half the bottle of CHG soap to a CLEAN washcloth.  Apply CHG soap ONLY FROM YOUR NECK DOWN TO YOUR TOES (washing for 3-5 minutes)  DO NOT use CHG soap on face, private areas, open wounds, or sores.  Pay special attention to the area where your surgery is being performed.  If you are having back surgery, having someone wash your back for you may be helpful. Wait 2 minutes after CHG soap is applied, then you may rinse off the CHG soap.  Pat dry with a clean towel  Put on clean pajamas    Additional instructions for the day of surgery: DO NOT APPLY any lotions, deodorants, cologne, or perfumes.   Do  not wear jewelry or makeup Do not wear nail polish, gel polish, artificial nails, or any other type of covering on natural nails (fingers and toes) Do not bring valuables to the hospital. [facility] is not responsible for valuables/personal belongings. Put on clean/comfortable clothes.  Please brush your teeth.  Ask your nurse before applying any prescription medications to the skin.

## 2023-07-18 NOTE — Progress Notes (Signed)
PCP - Dr. Chilton Greathouse Cardiologist - Dr. Thurmon Fair  PPM/ICD - denies Device Orders - n/a Rep Notified - n/a  Chest x-ray - denies EKG - 07/18/23 Stress Test - 12/15/13 ECHO - 07/16/12 Cardiac Cath - 10/31/13  Sleep Study - denies CPAP - n/a  Fasting Blood Sugar - 160s Checks Blood Sugar - every other day  Last dose of GLP1 agonist-  n/a GLP1 instructions: n/a  Blood Thinner Instructions: patient instructed to continue Aspirin and Plavix including the day of surgery.  Last dose of Eliquis is 07/20/23.  Patient verbalizes understanding.     ERAS Protcol - NPO   COVID TEST- n/a   Anesthesia review: yes - CAD, HTN  Patient denies shortness of breath, fever, cough and chest pain at PAT appointment   All instructions explained to the patient, with a verbal understanding of the material. Patient agrees to go over the instructions while at home for a better understanding. Patient also instructed to self quarantine after being tested for COVID-19. The opportunity to ask questions was provided.

## 2023-07-19 ENCOUNTER — Encounter: Payer: Self-pay | Admitting: Cardiovascular Disease

## 2023-07-19 ENCOUNTER — Ambulatory Visit: Payer: Medicare PPO | Attending: Cardiovascular Disease | Admitting: Cardiovascular Disease

## 2023-07-19 VITALS — BP 135/71 | HR 77 | Ht 66.0 in | Wt 178.8 lb

## 2023-07-19 DIAGNOSIS — I774 Celiac artery compression syndrome: Secondary | ICD-10-CM

## 2023-07-19 DIAGNOSIS — E785 Hyperlipidemia, unspecified: Secondary | ICD-10-CM

## 2023-07-19 DIAGNOSIS — D6869 Other thrombophilia: Secondary | ICD-10-CM | POA: Diagnosis not present

## 2023-07-19 DIAGNOSIS — I2510 Atherosclerotic heart disease of native coronary artery without angina pectoris: Secondary | ICD-10-CM | POA: Diagnosis not present

## 2023-07-19 DIAGNOSIS — I6523 Occlusion and stenosis of bilateral carotid arteries: Secondary | ICD-10-CM

## 2023-07-19 DIAGNOSIS — I4821 Permanent atrial fibrillation: Secondary | ICD-10-CM

## 2023-07-19 DIAGNOSIS — E1151 Type 2 diabetes mellitus with diabetic peripheral angiopathy without gangrene: Secondary | ICD-10-CM | POA: Diagnosis not present

## 2023-07-19 DIAGNOSIS — I701 Atherosclerosis of renal artery: Secondary | ICD-10-CM | POA: Diagnosis not present

## 2023-07-19 DIAGNOSIS — I10 Essential (primary) hypertension: Secondary | ICD-10-CM

## 2023-07-19 DIAGNOSIS — N1832 Chronic kidney disease, stage 3b: Secondary | ICD-10-CM

## 2023-07-19 DIAGNOSIS — I739 Peripheral vascular disease, unspecified: Secondary | ICD-10-CM | POA: Diagnosis not present

## 2023-07-19 NOTE — Progress Notes (Signed)
Cardiology Office Note    Evaluation Performed:  Follow-up visit  Date:  07/19/2023   ID:  [name], DOB [birth year], MRN [id number]  PCP:  Chilton Greathouse, MD  Cardiologist:  Thurmon Fair, MD  Electrophysiologist:  None   Chief Complaint:  CAD/PAD follow up, AFib  History of Present Illness:    [name] is a 73 y.o. female with early onset and extensive atherosclerotic disease involving the coronary, carotid, renal, mesenteric and lower extremity arterial beds.  She has type 2 diabetes mellitus, hypercholesterolemia and moderate chronic kidney disease.  She has long-term persistent (almost certainly permanent) atrial fibrillation.  She has preserved left ventricular systolic function and does not have manifestations of congestive heart failure.  The patient specifically denies any chest pain at rest or with exertion, dyspnea at rest or with exertion, orthopnea, paroxysmal nocturnal dyspnea, syncope, palpitations, focal neurological deficits, intermittent claudication, lower extremity edema, unexplained weight gain, cough, hemoptysis or wheezing.  She is quite sedentary but for her amount of exercise does not have claudication.  She has not had any focal neurological complaints.  However, carotid ultrasound shows progression of stenosis in the right carotid artery to 80-99% and she is scheduled for TCAR with Dr. Myra Gianotti next week.  She has very good metabolic control.  LDL cholesterol of 54 and a hemoglobin A1c of 5.9%.  Creatinine is at baseline 1.65-1.7.  She had CABG X 3 with LIMA-LAD, SVG-OM,SVG-RCA 2004. Re studied in 2005 and this showed patent grafts, with distal LAD and OM disease. Her Myoview in 7/12 was low risk, but repeat in 11/2013 showed new anterolateral ischemia.  She has no good revascularization options in the anterior wall. Although the LIMA anastomosis is patent there is no retrograde flow to the proximal LAD and the distal LAD is diffusely diseased and  not amenable to PCI. Her most recent echocardiogram in January 2014 showed normal left ventricular systolic function and regional wall motion. Echocardiogram in January 2014 showed normal left ventricular systolic function and wall motion with an estimated systolic PA pressure of 44 mm Hg   In March of 2013 she had DCCV. She was in normal sinus rhythm in August 2015 in the clinic.  In September 2016, December 2016 and January 2018 she presented with rate controlled asymptomatic atrial fibrillation. She has PAD and has had prior renal and popliteal stenting.   - Last lower extremity arterial Doppler was performed in April 2016 and showed bilateral moderate stenosis in the superficial femoral arteries with three-vessel runoff, ABI 0.69 on right, 0.7 on left. - She had left carotid stenting 02/12/12 and post op she was hypotensive and bradycardic.  A carotid Doppler scan performed in November 2024 showed widely patent left carotid stent and 80-99% right internal carotid stenosis (TCAR scheduled 07/24/2023). 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 She has hyperlipidemia, diabetes mellitus with type 2 and hypertension.  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   Past Medical History:  Diagnosis Date   Acute kidney failure (HCC)    ASHD (arteriosclerotic heart disease)    native vessel   Atrial fibrillation (HCC)    Breast cancer, left (HCC)    CAD (coronary artery disease) 2004   multivessel, status post CABG x3   Carotid artery disease (HCC)  stent in 2013   CKD (chronic kidney disease), stage III (HCC)    Diabetes (HCC)    GERD (gastroesophageal reflux disease)    History of tobacco abuse    Hx of adenomatous polyp of colon 07/26/2016   Hyperlipidemia    Hypertension    Hyperthyroidism     Myocardial infarction [facility]) 2002   Peripheral vascular disease (HCC)    S/P popliteal artery and renal artery stents   Renovascular hypertension    S/P CABG x 3 2004   Thyroid goiter    Tobacco abuse    Vaginal cancer (HCC) 1983   Past Surgical History:  Procedure Laterality Date   BREAST LUMPECTOMY WITH RADIOACTIVE SEED AND SENTINEL LYMPH NODE BIOPSY Left 08/08/2017   Procedure: LEFT BREAST LUMPECTOMY WITH RADIOACTIVE SEED AND SENTINEL LYMPH NODE BIOPSY;  Surgeon: Griselda Miner, MD;  Location: [facility];  Service: General;  Laterality: Left;   CARDIAC CATHETERIZATION  09/23/2002   3 vessel CAD >> CABG (Dr. Jonette Eva)   CARDIAC CATHETERIZATION  12/23/2003   patent LIMA, LAD, SVG to OM1, SVG to RCA, distal disease beyond graft insertion (Dr. Jonette Eva)   CARDIOVERSION  09/11/2011   Procedure: CARDIOVERSION;  Surgeon: Thurmon Fair, MD;  Location: [facility] OR;  Service: Cardiovascular;  Laterality: N/A;   CAROTID ANGIOGRAM Bilateral 02/12/2012   Procedure: CAROTID Rosalin Hawking;  Surgeon: Nada Libman, MD;  Location: [facility] CATH LAB;  Service: Cardiovascular;  Laterality: Bilateral;   CAROTID DOPPLER  03/02/2013   right bulb & prox ICA w/50-69% stenosis, L ICA stent open/patent   CAROTID STENT  02/12/2012   Carotid Stent Insertion, Carotid Angiogram - 9x40 Cordis Precise stent  (Dr. Janae Bridgeman)   CAROTID STENT INSERTION N/A 02/12/2012   Procedure: CAROTID STENT INSERTION;  Surgeon: Nada Libman, MD;  Location: [facility] C[facility] LAB;  Service: Cardiovascular;  Laterality: N/A;   CORONARY ARTERY BYPASS GRAFT  09/24/2002   LIMA to LAD, SVG to Cfx, SVG to RCA (Dr. Donata Clay)   LOWER EXTREMITY ARTERIAL DOPPLER  09/30/2012   bilateral ABIS (mod arterial insuff at rest); R SFA 70-99% stenosis, L SFA 50-69% stenosis   NM MYOCAR PERF WALL MOTION  12/31/2010   persnatine - moderate perfusion defect in apical anterior/apical/basal inferolateral/mid inferolateral/apical lateral walls - EF  65% - low risk   RENAL ARTERY ANGIOPLASTY  02/11/2003   transluminal angioplasty & stent wtih 4x60mm Genesis balloon expandable stent premounted on Cordis aviatory balloon (Dr. Verlin Fester)   RENAL DOPPLER  09/30/2012   R prox renal artery @ stent equal/less that 60% diameter reduction; L renal artery 1-59% stenosis   TONSILLECTOMY     TRANSTHORACIC ECHOCARDIOGRAM  07/02/2012   EF 55-65%, mild conc LVH, grade 1 diastolic dysfunction; calcicifed MV annulus, mild MR; LA mild-mod dilated; RA mod dilated; mod TR; PA peak pressure , RVSP increased (pulm htn)     Current Meds  Medication Sig   amLODipine (NORVASC) 10 MG tablet TAKE 1 TABLET EVERY DAY   apixaban (ELIQUIS) 5 MG TABS tablet Take 1 tablet by mouth twice daily   aspirin EC 81 MG tablet Take 1 tablet (81 mg total) by mouth daily. Swallow whole.   clopidogrel (PLAVIX) 75 MG tablet Take 1 tablet (75 mg total) by mouth daily.   ketorolac (ACULAR) 0.5 % ophthalmic solution INSTILL 1 DROP IN LEFT EYE TWICE DAILY   lansoprazole (PREVACID) 15 MG capsule Take 1 capsule (15 mg total) by mouth daily.   metFORMIN (GLUCOPHAGE)  1000 MG tablet Take 1 tablet (1,000 mg total) by mouth 2 (two) times daily with a meal.   methimazole (TAPAZOLE) 10 MG tablet Take 5 mg by mouth daily.   metoprolol tartrate (LOPRESSOR) 25 MG tablet Take 1 tablet (25 mg total) by mouth 2 (two) times daily.   nitroGLYCERIN (NITROSTAT) 0.4 MG SL tablet Place 1 tablet (0.4 mg total) under the tongue every 5 (five) minutes as needed for chest pain.   rosuvastatin (CRESTOR) 10 MG tablet TAKE 1 TABLET EVERY DAY   triamterene-hydrochlorothiazide (MAXZIDE-25) 37.5-25 MG tablet TAKE 1 TABLET EVERY DAY   valsartan (DIOVAN) 160 MG tablet TAKE 1 TABLET TWICE DAILY (NEED MD APPOINTMENT WITH CARDIOLOGIST)     Allergies:   Patient has no known allergies.   Social History   Tobacco Use   Smoking status: Former    Current packs/day: 0.00    Average packs/day: 1 pack/day for  30.0 years (30.0 ttl pk-yrs)    Types: Cigarettes    Start date: 07/03/1971    Quit date: 07/02/2001    Years since quitting: 22.0   Smokeless tobacco: Never  Vaping Use   Vaping status: Never Used  Substance Use Topics   Alcohol use: Not Currently    Alcohol/week: 2.0 - 3.0 standard drinks of alcohol    Types: 2 - 3 Standard drinks or equivalent per week    Comment: socially   Drug use: No     Family Hx: The patient's family history includes Deep vein thrombosis in her sister; Diabetes in her brother and sister; Heart disease in her sister; Hyperlipidemia in her sister and son; Hypertension in her mother, sister, and son; Other in her brother and mother. There is no history of Colon cancer, Stomach cancer, Esophageal cancer, Rectal cancer, or Liver cancer.  ROS:   Please see the history of present illness.     All other systems reviewed and are negative.   Prior CV studies:   The following studies were reviewed today:  Carotid duplex ultrasound February 2022, lower extremity ABIs February 2021 renal duplex ultrasound May 2019,   Labs/Other Tests and Data Reviewed:    EKG: Ordered yesterday and personally reviewed shows atrial fibrillation with slow ventricular response , incomplete right bundle branch block pattern, unchanged from previous tracing  EKG Interpretation Date/Time:    Ventricular Rate:    PR Interval:    QRS Duration:    QT Interval:    QTC Calculation:   R Axis:      Text Interpretation:          Recent Labs: 07/18/2023: ALT 14; BUN 20; Creatinine, Ser 1.65; Hemoglobin 13.0; Platelets 259; Potassium 3.6; Sodium 141  01/30/2021 potassium 4.5, ALT 8, TSH 2.52, hemoglobin A1c 5.9% 09/15/2020 Creatinine 1.48 Recent Lipid Panel Lab Results  Component Value Date/Time   CHOL 101 11/30/2013 10:14 AM   TRIG 70 11/30/2013 10:14 AM   HDL 43 11/30/2013 10:14 AM   CHOLHDL 2.3 11/30/2013 10:14 AM   LDLCALC 44 11/30/2013 10:14 AM  01/30/2021 Cholesterol 115,  HDL 46, LDL 59, triglycerides 51 05/21/2023 Cholesterol 110, HDL 44, LDL 58, triglycerides 40 Hemoglobin A1c 5.9%  Wt Readings from Last 3 Encounters:  07/19/23 178 lb 12.8 oz (81.1 kg)  07/18/23 177 lb 11.2 oz (80.6 kg)  07/08/23 178 lb (80.7 kg)     Objective:    Vital Signs:  BP 135/71 (BP Location: Left Arm, Patient Position: Sitting)   Pulse 77   Ht 5\' 6"  (1.676  m)   Wt 178 lb 12.8 oz (81.1 kg)   SpO2 99%   BMI 28.86 kg/m     General: Alert, oriented x3, no distress, appears well, overweight but not obese Head: no evidence of trauma, PERRL, EOMI, no exophtalmos or lid lag, no myxedema, no xanthelasma; normal ears, nose and oropharynx Neck: normal jugular venous pulsations and no hepatojugular reflux; brisk carotid pulses without delay and faint bilateral carotid bruits Chest: clear to auscultation, no signs of consolidation by percussion or palpation, normal fremitus, symmetrical and full respiratory excursions Cardiovascular: normal position and quality of the apical impulse, regular rhythm, normal first and second heart sounds, no murmurs, rubs or gallops Abdomen: no tenderness or distention, no masses by palpation, no abnormal pulsatility or arterial bruits, normal bowel sounds, no hepatosplenomegaly Extremities: no clubbing, cyanosis or edema; 2+ radial, ulnar and brachial pulses bilaterally; diminished pedal pulses bilaterally.  Bilateral femoral bruits Neurological: grossly nonfocal Psych: Normal mood and affect  ASSESSMENT & PLAN:    AFib:   Permanent arrhythmia, well rate controlled, on anticoagulation.CHADSVasc 5 (age, gender, diabetes mellitus, hypertension, vascular disease).  Strong CAD: It has been many years since she has had any angina.  She has normal left ventricular systolic function (last assessed in 2014).   Preserved left ventricular systolic function.  She is known to have anterolateral ischemia by nuclear stress testing (patent LIMA to LAD but with  occlusion of the LAD without retrograde flow to proximal vessel, diffuse distal LAD disease), but is best suited for medical treatment. There are no good options for revascularization.  Continue beta-blocker.  Not on chronic antiplatelet therapy due to full anticoagulation.  On statin. Carotid stenosis: 80-99% stenosis in the right carotid artery and will undergo TCAR on 07/24/2023 with Dr. Henrietta Dine left carotid stent.  Due for repeat evaluation by ultrasound in February 2022.  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 There is also 50% stenosis in the M1 segment of the left middle cerebral artery.  No neurological complaints.  PAD: Seems to have fewer complaints of claudication this year.  Noted to have mod obstruction on the right and very mild obstruction on the left lower extremity.  Encouraged daily walking to limit of symptoms, to promote collateral formation. S/P R renal stent:  stopped doing yearly duplex ultrasounds since this has been (years. HLP: LDL less than 70 on current medications. DM: Excellent hemoglobin A1c at 5.9%.  Encouraged her to try to lose weight.  Daily exercise would be very helpful. Eliquis: Despite renal dysfunction, the 5 mg twice daily dose is still appropriate based on her age and weight.  Has not had any bleeding problems. CKD 3b-4: Stable creatinine around 1.7, GFR right around 30 HTN: Well-controlled.   Patient Instructions  Medication Instructions:  No changes *If you need a refill on your cardiac medications before your next appointment, please call your pharmacy*  Follow-Up: At [facility], you and your health needs are our priority.  As part of our continuing mission to provide you with exceptional heart care, we have created designated Provider Care Teams.  These Care Teams include your primary Cardiologist (physician) and Advanced  Practice Providers (APPs -  Physician Assistants and Nurse Practitioners) who all work together to provide you with the care you need, when you need it.  We recommend signing up for the patient portal called "MyChart".  Sign up information  is provided on this After Visit Summary.  MyChart is used to connect with patients for Virtual Visits (Telemedicine).  Patients are able to view lab/test results, encounter notes, upcoming appointments, etc.  Non-urgent messages can be sent to your provider as well.   To learn more about what you can do with MyChart, go to [email or web address].    Your next appointment:   1 year(s)  Provider:   Thurmon Fair, MD            Signed, Thurmon Fair, MD  07/19/2023 10:22 AM    [facility]

## 2023-07-19 NOTE — Patient Instructions (Signed)

## 2023-07-19 NOTE — Anesthesia Preprocedure Evaluation (Signed)
Anesthesia Evaluation  Patient identified by MRN, date of birth, ID band Patient awake    Reviewed: Allergy & Precautions, NPO status , Patient's Chart, lab work & pertinent test results  Airway Mallampati: III  TM Distance: >3 FB Neck ROM: Full    Dental  (+) Dental Advisory Given   Pulmonary former smoker   breath sounds clear to auscultation       Cardiovascular hypertension, Pt. on medications + CAD, + Past MI, + CABG and + Peripheral Vascular Disease   Rhythm:Regular Rate:Normal     Neuro/Psych negative neurological ROS     GI/Hepatic Neg liver ROS,GERD  ,,  Endo/Other  diabetes, Type 2 Hyperthyroidism   Renal/GU CRFRenal disease     Musculoskeletal   Abdominal   Peds  Hematology negative hematology ROS (+)   Anesthesia Other Findings   Reproductive/Obstetrics                             Anesthesia Physical Anesthesia Plan  ASA: 4  Anesthesia Plan: General   Post-op Pain Management: Tylenol PO (pre-op)*   Induction: Intravenous  PONV Risk Score and Plan: 3 and Dexamethasone, Ondansetron and Treatment may vary due to age or medical condition  Airway Management Planned: Oral ETT  Additional Equipment: Arterial line  Intra-op Plan:   Post-operative Plan: Extubation in OR  Informed Consent: I have reviewed the patients History and Physical, chart, labs and discussed the procedure including the risks, benefits and alternatives for the proposed anesthesia with the patient or authorized representative who has indicated his/her understanding and acceptance.     Dental advisory given  Plan Discussed with: CRNA  Anesthesia Plan Comments: (PAT note by Antionette Poles, PA-C: 73 year old female follows with cardiology for history of early onset and extensive atherosclerotic disease involving the coronary, carotid, renal, mesenteric and lower extremity arterial beds, HTN, HLD,  persistent A-fib on Eliquis. She had CABG X 3 with LIMA-LAD, SVG-OM,SVG-RCA 2004.  LV systolic function normal by last echo 2014.  She has had prior right renal, right popliteal, and left carotid stenting. A carotid Doppler scan performed in November 2024 showed widely patent left carotid stent and 80-99% right internal carotid stenosis.  Per cardiology notes, she is known to have anterolateral ischemia by nuclear stress testing (patent LIMA to LAD but with occlusion of the LAD without retrograde flow to proximal vessel, diffuse distal LAD disease), but is best suited for medical treatment.  Last seen by cardiologist Dr. Royann Shivers on 07/19/2023 and noted to be stable from a cardiovascular standpoint at that time.  Discussed upcoming TCAR with Dr. Myra Gianotti on 07/24/2023.  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  CTA neck 06/28/2023 notes, "Massive chronic goiter, last evaluated by ultrasound and biopsy in 2019 according to our records. The goiter has enlarged since 2013. Upper mediastinal extension of the goiter as seen previously."  Images reviewed by anesthesiologist Dr. Charlynn Grimes.  Advised no contraindication to proceeding with anesthesia as planned.  Preop labs reviewed, creatinine elevated 1.65 (appears to be near baseline), otherwise unremarkable.  A1c 6.5.  EKG 07/18/2023: Atrial fibrillation with slow ventricular response.  Rate 56. Incomplete right bundle branch block. ST & T wave abnormality, consider anterolateral ischemia.  )        Anesthesia Quick Evaluation

## 2023-07-19 NOTE — Progress Notes (Signed)
Anesthesia Chart Review:  73 year old female follows with cardiology for history of early onset and extensive atherosclerotic disease involving the coronary, carotid, renal, mesenteric and lower extremity arterial beds, HTN, HLD, persistent A-fib on Eliquis. She had CABG X 3 with LIMA-LAD, SVG-OM,SVG-RCA 2004.  LV systolic function normal by last echo 2014.  She has had prior right renal, right popliteal, and left carotid stenting. A carotid Doppler scan performed in November 2024 showed widely patent left carotid stent and 80-99% right internal carotid stenosis.  Per cardiology notes, she is known to have anterolateral ischemia by nuclear stress testing (patent LIMA to LAD but with occlusion of the LAD without retrograde flow to proximal vessel, diffuse distal LAD disease), but is best suited for medical treatment.  Last seen by cardiologist Dr. Royann Shivers on 07/19/2023 and noted to be stable from a cardiovascular standpoint at that time.  Discussed upcoming TCAR with Dr. Myra Gianotti on 07/24/2023.  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  CTA neck 06/28/2023 notes, " Massive chronic goiter, last evaluated by ultrasound and biopsy in 2019 according to our records. The goiter has enlarged since 2013. Upper mediastinal extension of the goiter as seen previously."  Images reviewed by anesthesiologist Dr. Charlynn Grimes.  Advised no contraindication to proceeding with anesthesia as planned.  Preop labs reviewed, creatinine elevated 1.65 (appears to be near baseline), otherwise unremarkable.  A1c 6.5.  EKG 07/18/2023: Atrial fibrillation with slow ventricular response.  Rate 56. Incomplete right bundle branch block. ST & T wave abnormality, consider anterolateral ischemia.   [facility]/Anesthesiology Phone [phone] 07/19/2023 2:41 PM

## 2023-07-24 ENCOUNTER — Other Ambulatory Visit: Payer: Self-pay

## 2023-07-24 ENCOUNTER — Inpatient Hospital Stay (HOSPITAL_COMMUNITY): Payer: Self-pay | Admitting: Physician Assistant

## 2023-07-24 ENCOUNTER — Encounter (HOSPITAL_COMMUNITY): Payer: Self-pay | Admitting: Surgery

## 2023-07-24 ENCOUNTER — Inpatient Hospital Stay (HOSPITAL_COMMUNITY): Payer: Self-pay | Admitting: Anesthesiology

## 2023-07-24 ENCOUNTER — Inpatient Hospital Stay (HOSPITAL_COMMUNITY): Payer: Medicare PPO

## 2023-07-24 ENCOUNTER — Inpatient Hospital Stay (HOSPITAL_COMMUNITY)
Admission: RE | Admit: 2023-07-24 | Discharge: 2023-07-26 | DRG: 035 | Disposition: A | Discharge status: Home / Self Care | Payer: Medicare PPO | Attending: Surgery | Admitting: Surgery

## 2023-07-24 ENCOUNTER — Encounter (HOSPITAL_COMMUNITY)
Admission: RE | Disposition: A | Discharge status: Home / Self Care | Payer: Self-pay | Source: Home / Self Care | Attending: Surgery

## 2023-07-24 DIAGNOSIS — I6521 Occlusion and stenosis of right carotid artery: Secondary | ICD-10-CM | POA: Diagnosis not present

## 2023-07-24 DIAGNOSIS — Z833 Family history of diabetes mellitus: Secondary | ICD-10-CM | POA: Diagnosis not present

## 2023-07-24 DIAGNOSIS — N1832 Chronic kidney disease, stage 3b: Secondary | ICD-10-CM | POA: Diagnosis not present

## 2023-07-24 DIAGNOSIS — K219 Gastro-esophageal reflux disease without esophagitis: Secondary | ICD-10-CM | POA: Diagnosis present

## 2023-07-24 DIAGNOSIS — I2510 Atherosclerotic heart disease of native coronary artery without angina pectoris: Secondary | ICD-10-CM | POA: Diagnosis present

## 2023-07-24 DIAGNOSIS — E0500 Thyrotoxicosis with diffuse goiter without thyrotoxic crisis or storm: Secondary | ICD-10-CM | POA: Diagnosis present

## 2023-07-24 DIAGNOSIS — Z7902 Long term (current) use of antithrombotics/antiplatelets: Secondary | ICD-10-CM | POA: Diagnosis not present

## 2023-07-24 DIAGNOSIS — Z7984 Long term (current) use of oral hypoglycemic drugs: Secondary | ICD-10-CM | POA: Diagnosis not present

## 2023-07-24 DIAGNOSIS — Z7982 Long term (current) use of aspirin: Secondary | ICD-10-CM

## 2023-07-24 DIAGNOSIS — Z95828 Presence of other vascular implants and grafts: Secondary | ICD-10-CM

## 2023-07-24 DIAGNOSIS — Z951 Presence of aortocoronary bypass graft: Secondary | ICD-10-CM | POA: Diagnosis not present

## 2023-07-24 DIAGNOSIS — R001 Bradycardia, unspecified: Secondary | ICD-10-CM | POA: Diagnosis not present

## 2023-07-24 DIAGNOSIS — Z8249 Family history of ischemic heart disease and other diseases of the circulatory system: Secondary | ICD-10-CM

## 2023-07-24 DIAGNOSIS — Z87891 Personal history of nicotine dependence: Secondary | ICD-10-CM

## 2023-07-24 DIAGNOSIS — Z79899 Other long term (current) drug therapy: Secondary | ICD-10-CM

## 2023-07-24 DIAGNOSIS — Z853 Personal history of malignant neoplasm of breast: Secondary | ICD-10-CM

## 2023-07-24 DIAGNOSIS — Z83438 Family history of other disorder of lipoprotein metabolism and other lipidemia: Secondary | ICD-10-CM | POA: Diagnosis not present

## 2023-07-24 DIAGNOSIS — N183 Chronic kidney disease, stage 3 unspecified: Secondary | ICD-10-CM | POA: Diagnosis not present

## 2023-07-24 DIAGNOSIS — I10 Essential (primary) hypertension: Secondary | ICD-10-CM | POA: Diagnosis not present

## 2023-07-24 DIAGNOSIS — I252 Old myocardial infarction: Secondary | ICD-10-CM | POA: Diagnosis not present

## 2023-07-24 DIAGNOSIS — E1122 Type 2 diabetes mellitus with diabetic chronic kidney disease: Secondary | ICD-10-CM | POA: Diagnosis not present

## 2023-07-24 DIAGNOSIS — E7800 Pure hypercholesterolemia, unspecified: Secondary | ICD-10-CM | POA: Diagnosis present

## 2023-07-24 DIAGNOSIS — E1151 Type 2 diabetes mellitus with diabetic peripheral angiopathy without gangrene: Secondary | ICD-10-CM | POA: Diagnosis present

## 2023-07-24 DIAGNOSIS — Z95820 Peripheral vascular angioplasty status with implants and grafts: Secondary | ICD-10-CM

## 2023-07-24 DIAGNOSIS — Z860101 Personal history of adenomatous and serrated colon polyps: Secondary | ICD-10-CM

## 2023-07-24 DIAGNOSIS — Z01818 Encounter for other preprocedural examination: Secondary | ICD-10-CM

## 2023-07-24 DIAGNOSIS — Z7901 Long term (current) use of anticoagulants: Secondary | ICD-10-CM | POA: Diagnosis not present

## 2023-07-24 DIAGNOSIS — I129 Hypertensive chronic kidney disease with stage 1 through stage 4 chronic kidney disease, or unspecified chronic kidney disease: Secondary | ICD-10-CM | POA: Diagnosis not present

## 2023-07-24 DIAGNOSIS — I4819 Other persistent atrial fibrillation: Secondary | ICD-10-CM | POA: Diagnosis not present

## 2023-07-24 DIAGNOSIS — Z8544 Personal history of malignant neoplasm of other female genital organs: Secondary | ICD-10-CM

## 2023-07-24 HISTORY — PX: TRANSCAROTID ARTERY REVASCULARIZATIONÂ: SHX6778

## 2023-07-24 LAB — GLUCOSE, CAPILLARY
Glucose-Capillary: 108 mg/dL — ABNORMAL HIGH (ref 70–99)
Glucose-Capillary: 150 mg/dL — ABNORMAL HIGH (ref 70–99)
Glucose-Capillary: 119 mg/dL — ABNORMAL HIGH (ref 70–99)
Glucose-Capillary: 139 mg/dL — ABNORMAL HIGH (ref 70–99)
Glucose-Capillary: 161 mg/dL — ABNORMAL HIGH (ref 70–99)

## 2023-07-24 LAB — ABO/RH: ABO/RH(D): O POS

## 2023-07-24 SURGERY — TRANSCAROTID ARTERY REVASCULARIZATION (TCAR)
Anesthesia: General | Laterality: Right

## 2023-07-24 MED ORDER — LACTATED RINGERS IV SOLN: INTRAVENOUS | Status: DC

## 2023-07-24 MED ORDER — ONDANSETRON HCL 4 MG/2ML IJ SOLN
INTRAMUSCULAR | Status: AC
  Filled 2023-07-24: qty 2

## 2023-07-24 MED ORDER — SODIUM CHLORIDE 0.9 % IV SOLN: INTRAVENOUS | Status: DC

## 2023-07-24 MED ORDER — ROCURONIUM BROMIDE 10 MG/ML (PF) SYRINGE
PREFILLED_SYRINGE | INTRAVENOUS | Status: DC | PRN
  Administered 2023-07-24: 40 mg via INTRAVENOUS
  Administered 2023-07-24: 20 mg via INTRAVENOUS
  Administered 2023-07-24: 30 mg via INTRAVENOUS
  Administered 2023-07-24 (×2): 20 mg via INTRAVENOUS

## 2023-07-24 MED ORDER — SODIUM CHLORIDE 0.9 % IV BOLUS
500.0000 mL | INTRAVENOUS | Status: AC
  Administered 2023-07-24: 500 mL via INTRAVENOUS

## 2023-07-24 MED ORDER — PROTAMINE SULFATE 10 MG/ML IV SOLN
INTRAVENOUS | Status: DC | PRN
  Administered 2023-07-24: 50 mg via INTRAVENOUS

## 2023-07-24 MED ORDER — CHLORHEXIDINE GLUCONATE CLOTH 2 % EX PADS: 6.0000 | MEDICATED_PAD | Freq: Once | CUTANEOUS | Status: DC

## 2023-07-24 MED ORDER — AMISULPRIDE (ANTIEMETIC) 5 MG/2ML IV SOLN
10.0000 mg | Freq: Once | INTRAVENOUS | Status: AC | PRN
  Administered 2023-07-24: 10 mg via INTRAVENOUS

## 2023-07-24 MED ORDER — PROPOFOL 1000 MG/100ML IV EMUL
INTRAVENOUS | Status: AC
Start: 2023-07-24 — End: ?
  Filled 2023-07-24: qty 100

## 2023-07-24 MED ORDER — ASPIRIN 81 MG PO TBEC
81.0000 mg | DELAYED_RELEASE_TABLET | Freq: Every day | ORAL | Status: DC
Start: 2023-07-25 — End: 2023-07-26
  Administered 2023-07-25 – 2023-07-26 (×2): 81 mg via ORAL
  Filled 2023-07-24 (×2): qty 1

## 2023-07-24 MED ORDER — PHENYLEPHRINE 80 MCG/ML (10ML) SYRINGE FOR IV PUSH (FOR BLOOD PRESSURE SUPPORT)
PREFILLED_SYRINGE | INTRAVENOUS | Status: AC
  Filled 2023-07-24: qty 10

## 2023-07-24 MED ORDER — ROCURONIUM BROMIDE 10 MG/ML (PF) SYRINGE
PREFILLED_SYRINGE | INTRAVENOUS | Status: AC
  Filled 2023-07-24: qty 10

## 2023-07-24 MED ORDER — METHIMAZOLE 5 MG PO TABS
5.0000 mg | ORAL_TABLET | Freq: Every day | ORAL | Status: DC
  Administered 2023-07-25 – 2023-07-26 (×2): 5 mg via ORAL
  Filled 2023-07-24 (×2): qty 1

## 2023-07-24 MED ORDER — PROPOFOL 10 MG/ML IV BOLUS
INTRAVENOUS | Status: AC
  Filled 2023-07-24: qty 20

## 2023-07-24 MED ORDER — PHENYLEPHRINE HCL-NACL 20-0.9 MG/250ML-% IV SOLN
INTRAVENOUS | Status: AC
  Filled 2023-07-24: qty 250

## 2023-07-24 MED ORDER — NALOXONE HCL 0.4 MG/ML IJ SOLN
INTRAMUSCULAR | Status: AC
  Filled 2023-07-24: qty 1

## 2023-07-24 MED ORDER — KETOROLAC TROMETHAMINE 0.5 % OP SOLN
1.0000 [drp] | Freq: Two times a day (BID) | OPHTHALMIC | Status: DC
Start: 2023-07-24 — End: 2023-07-26
  Administered 2023-07-24 – 2023-07-26 (×4): 1 [drp] via OPHTHALMIC
  Filled 2023-07-24 (×2): qty 5

## 2023-07-24 MED ORDER — PHENYLEPHRINE 80 MCG/ML (10ML) SYRINGE FOR IV PUSH (FOR BLOOD PRESSURE SUPPORT)
PREFILLED_SYRINGE | INTRAVENOUS | Status: DC | PRN
  Administered 2023-07-24: 80 ug via INTRAVENOUS
  Administered 2023-07-24: 160 ug via INTRAVENOUS
  Administered 2023-07-24: 80 ug via INTRAVENOUS

## 2023-07-24 MED ORDER — POLYETHYLENE GLYCOL 3350 17 G PO PACK: 17.0000 g | PACK | Freq: Every day | ORAL | Status: DC | PRN

## 2023-07-24 MED ORDER — EPHEDRINE 5 MG/ML INJ
INTRAVENOUS | Status: AC
  Filled 2023-07-24: qty 5

## 2023-07-24 MED ORDER — DOPAMINE-DEXTROSE 3.2-5 MG/ML-% IV SOLN
5.0000 ug/kg/min | INTRAVENOUS | Status: DC
  Administered 2023-07-24: 5 ug/kg/min via INTRAVENOUS

## 2023-07-24 MED ORDER — TRIAMTERENE-HCTZ 37.5-25 MG PO TABS
1.0000 | ORAL_TABLET | Freq: Every day | ORAL | Status: DC
  Filled 2023-07-24: qty 1

## 2023-07-24 MED ORDER — POTASSIUM CHLORIDE CRYS ER 20 MEQ PO TBCR: 20.0000 meq | EXTENDED_RELEASE_TABLET | Freq: Every day | ORAL | Status: DC | PRN

## 2023-07-24 MED ORDER — IRBESARTAN 150 MG PO TABS
150.0000 mg | ORAL_TABLET | Freq: Every day | ORAL | Status: DC
Start: 2023-07-24 — End: 2023-07-26
  Filled 2023-07-24: qty 1

## 2023-07-24 MED ORDER — EPHEDRINE SULFATE-NACL 50-0.9 MG/10ML-% IV SOSY
PREFILLED_SYRINGE | INTRAVENOUS | Status: DC | PRN
  Administered 2023-07-24 (×2): 5 mg via INTRAVENOUS
  Administered 2023-07-24: 10 mg via INTRAVENOUS

## 2023-07-24 MED ORDER — GUAIFENESIN-DM 100-10 MG/5ML PO SYRP
15.0000 mL | ORAL_SOLUTION | ORAL | Status: DC | PRN
  Filled 2023-07-24: qty 15

## 2023-07-24 MED ORDER — HEPARIN 6000 UNIT IRRIGATION SOLUTION
Status: AC
  Filled 2023-07-24: qty 500

## 2023-07-24 MED ORDER — ONDANSETRON HCL 4 MG/2ML IJ SOLN
4.0000 mg | Freq: Four times a day (QID) | INTRAMUSCULAR | Status: DC | PRN
Start: 2023-07-24 — End: 2023-07-26

## 2023-07-24 MED ORDER — FENTANYL CITRATE (PF) 250 MCG/5ML IJ SOLN
INTRAMUSCULAR | Status: DC | PRN
  Administered 2023-07-24: 100 ug via INTRAVENOUS
  Administered 2023-07-24 (×3): 50 ug via INTRAVENOUS

## 2023-07-24 MED ORDER — CEFAZOLIN SODIUM-DEXTROSE 2-4 GM/100ML-% IV SOLN
2.0000 g | INTRAVENOUS | Status: AC
  Administered 2023-07-24: 2 g via INTRAVENOUS
  Filled 2023-07-24: qty 100

## 2023-07-24 MED ORDER — INSULIN ASPART 100 UNIT/ML IJ SOLN: 0.0000 [IU] | INTRAMUSCULAR | Status: DC | PRN

## 2023-07-24 MED ORDER — INSULIN ASPART 100 UNIT/ML IJ SOLN
0.0000 [IU] | Freq: Three times a day (TID) | INTRAMUSCULAR | Status: DC
  Administered 2023-07-24: 1 [IU] via SUBCUTANEOUS
  Administered 2023-07-25 (×2): 2 [IU] via SUBCUTANEOUS

## 2023-07-24 MED ORDER — CLOPIDOGREL BISULFATE 75 MG PO TABS
75.0000 mg | ORAL_TABLET | Freq: Every day | ORAL | Status: DC
  Administered 2023-07-25 – 2023-07-26 (×2): 75 mg via ORAL
  Filled 2023-07-24 (×2): qty 1

## 2023-07-24 MED ORDER — SODIUM CHLORIDE 0.9 % IV SOLN
0.0000 ug/min | INTRAVENOUS | Status: DC
  Administered 2023-07-24: 20 ug/min via INTRAVENOUS

## 2023-07-24 MED ORDER — PHENYLEPHRINE HCL-NACL 20-0.9 MG/250ML-% IV SOLN
INTRAVENOUS | Status: DC | PRN
  Administered 2023-07-24: 40 ug/min via INTRAVENOUS

## 2023-07-24 MED ORDER — GLYCOPYRROLATE PF 0.2 MG/ML IJ SOSY
PREFILLED_SYRINGE | INTRAMUSCULAR | Status: DC | PRN
  Administered 2023-07-24 (×2): .2 mg via INTRAVENOUS

## 2023-07-24 MED ORDER — AMISULPRIDE (ANTIEMETIC) 5 MG/2ML IV SOLN
INTRAVENOUS | Status: AC
  Filled 2023-07-24: qty 2

## 2023-07-24 MED ORDER — LIDOCAINE 2% (20 MG/ML) 5 ML SYRINGE
INTRAMUSCULAR | Status: DC | PRN
  Administered 2023-07-24: 60 mg via INTRAVENOUS

## 2023-07-24 MED ORDER — ONDANSETRON HCL 4 MG/2ML IJ SOLN
INTRAMUSCULAR | Status: DC | PRN
  Administered 2023-07-24: 4 mg via INTRAVENOUS

## 2023-07-24 MED ORDER — SUGAMMADEX SODIUM 200 MG/2ML IV SOLN
INTRAVENOUS | Status: DC | PRN
  Administered 2023-07-24: 300 mg via INTRAVENOUS
  Administered 2023-07-24: 100 mg via INTRAVENOUS

## 2023-07-24 MED ORDER — FENTANYL CITRATE (PF) 100 MCG/2ML IJ SOLN
INTRAMUSCULAR | Status: AC
  Filled 2023-07-24: qty 2

## 2023-07-24 MED ORDER — SODIUM CHLORIDE 0.9 % IV SOLN: 500.0000 mL | Freq: Once | INTRAVENOUS | Status: DC | PRN

## 2023-07-24 MED ORDER — NITROGLYCERIN 0.4 MG SL SUBL: 0.4000 mg | SUBLINGUAL_TABLET | SUBLINGUAL | Status: DC | PRN

## 2023-07-24 MED ORDER — ALUM & MAG HYDROXIDE-SIMETH 200-200-20 MG/5ML PO SUSP: 15.0000 mL | ORAL | Status: DC | PRN

## 2023-07-24 MED ORDER — HYDROMORPHONE HCL 1 MG/ML IJ SOLN
0.5000 mg | INTRAMUSCULAR | Status: DC | PRN
  Administered 2023-07-24: 0.5 mg via INTRAVENOUS
  Filled 2023-07-24: qty 0.5

## 2023-07-24 MED ORDER — METOPROLOL TARTRATE 25 MG PO TABS
25.0000 mg | ORAL_TABLET | Freq: Two times a day (BID) | ORAL | Status: DC
  Administered 2023-07-25: 25 mg via ORAL
  Filled 2023-07-24: qty 1

## 2023-07-24 MED ORDER — DOPAMINE-DEXTROSE 3.2-5 MG/ML-% IV SOLN
5.0000 ug/kg/min | INTRAVENOUS | Status: DC
Start: 2023-07-24 — End: 2023-07-26
  Administered 2023-07-24: 5 ug/kg/min via INTRAVENOUS
  Filled 2023-07-24: qty 250

## 2023-07-24 MED ORDER — FENTANYL CITRATE (PF) 250 MCG/5ML IJ SOLN
INTRAMUSCULAR | Status: AC
  Filled 2023-07-24: qty 5

## 2023-07-24 MED ORDER — ALBUMIN HUMAN 5 % IV SOLN
12.5000 g | Freq: Once | INTRAVENOUS | Status: AC
  Administered 2023-07-24: 12.5 g via INTRAVENOUS

## 2023-07-24 MED ORDER — ACETAMINOPHEN 500 MG PO TABS
1000.0000 mg | ORAL_TABLET | Freq: Once | ORAL | Status: AC
  Administered 2023-07-24: 1000 mg via ORAL
  Filled 2023-07-24: qty 2

## 2023-07-24 MED ORDER — ALBUMIN HUMAN 5 % IV SOLN
INTRAVENOUS | Status: AC
  Filled 2023-07-24: qty 250

## 2023-07-24 MED ORDER — HEPARIN SODIUM (PORCINE) 1000 UNIT/ML IJ SOLN
INTRAMUSCULAR | Status: DC | PRN
  Administered 2023-07-24: 8000 [IU] via INTRAVENOUS
  Administered 2023-07-24: 2000 [IU] via INTRAVENOUS

## 2023-07-24 MED ORDER — HEPARIN 6000 UNIT IRRIGATION SOLUTION
Status: DC | PRN
  Administered 2023-07-24: 1

## 2023-07-24 MED ORDER — ORAL CARE MOUTH RINSE: 15.0000 mL | Freq: Once | OROMUCOSAL | Status: AC

## 2023-07-24 MED ORDER — PROPOFOL 10 MG/ML IV BOLUS
INTRAVENOUS | Status: DC | PRN
  Administered 2023-07-24: 50 mg via INTRAVENOUS
  Administered 2023-07-24: 125 ug/kg/min via INTRAVENOUS
  Administered 2023-07-24: 100 mg via INTRAVENOUS

## 2023-07-24 MED ORDER — DEXAMETHASONE SODIUM PHOSPHATE 10 MG/ML IJ SOLN
INTRAMUSCULAR | Status: AC
  Filled 2023-07-24: qty 1

## 2023-07-24 MED ORDER — IODIXANOL 320 MG/ML IV SOLN
INTRAVENOUS | Status: DC | PRN
  Administered 2023-07-24: 100 mL

## 2023-07-24 MED ORDER — HYDRALAZINE HCL 20 MG/ML IJ SOLN: 5.0000 mg | INTRAMUSCULAR | Status: DC | PRN

## 2023-07-24 MED ORDER — CHLORHEXIDINE GLUCONATE 0.12 % MT SOLN
15.0000 mL | Freq: Once | OROMUCOSAL | Status: AC
  Administered 2023-07-24: 15 mL via OROMUCOSAL
  Filled 2023-07-24: qty 15

## 2023-07-24 MED ORDER — PANTOPRAZOLE SODIUM 40 MG PO TBEC
40.0000 mg | DELAYED_RELEASE_TABLET | Freq: Every day | ORAL | Status: DC
Start: 2023-07-25 — End: 2023-07-26
  Administered 2023-07-25 – 2023-07-26 (×2): 40 mg via ORAL
  Filled 2023-07-24 (×2): qty 1

## 2023-07-24 MED ORDER — LABETALOL HCL 5 MG/ML IV SOLN: 10.0000 mg | INTRAVENOUS | Status: DC | PRN

## 2023-07-24 MED ORDER — ACETAMINOPHEN 650 MG RE SUPP: 325.0000 mg | RECTAL | Status: DC | PRN

## 2023-07-24 MED ORDER — LIDOCAINE 2% (20 MG/ML) 5 ML SYRINGE
INTRAMUSCULAR | Status: AC
  Filled 2023-07-24: qty 5

## 2023-07-24 MED ORDER — DEXAMETHASONE SODIUM PHOSPHATE 10 MG/ML IJ SOLN
INTRAMUSCULAR | Status: DC | PRN
  Administered 2023-07-24: 5 mg via INTRAVENOUS

## 2023-07-24 MED ORDER — ACETAMINOPHEN 325 MG PO TABS
325.0000 mg | ORAL_TABLET | ORAL | Status: DC | PRN
Start: 2023-07-24 — End: 2023-07-26

## 2023-07-24 MED ORDER — HEPARIN SODIUM (PORCINE) 1000 UNIT/ML IJ SOLN
INTRAMUSCULAR | Status: AC
  Filled 2023-07-24: qty 20

## 2023-07-24 MED ORDER — TRIAMTERENE-HCTZ 37.5-25 MG PO TABS
1.0000 | ORAL_TABLET | Freq: Every day | ORAL | Status: DC
  Administered 2023-07-25: 1 via ORAL
  Filled 2023-07-24 (×3): qty 1

## 2023-07-24 MED ORDER — CEFAZOLIN SODIUM-DEXTROSE 2-4 GM/100ML-% IV SOLN
2.0000 g | Freq: Three times a day (TID) | INTRAVENOUS | Status: AC
  Administered 2023-07-24 (×2): 2 g via INTRAVENOUS
  Filled 2023-07-24 (×2): qty 100

## 2023-07-24 MED ORDER — ROSUVASTATIN CALCIUM 5 MG PO TABS
10.0000 mg | ORAL_TABLET | Freq: Every day | ORAL | Status: DC
  Administered 2023-07-25 – 2023-07-26 (×2): 10 mg via ORAL
  Filled 2023-07-24 (×2): qty 2

## 2023-07-24 MED ORDER — MAGNESIUM SULFATE 2 GM/50ML IV SOLN: 2.0000 g | Freq: Every day | INTRAVENOUS | Status: DC | PRN

## 2023-07-24 MED ORDER — DOPAMINE-DEXTROSE 3.2-5 MG/ML-% IV SOLN
INTRAVENOUS | Status: AC
  Filled 2023-07-24: qty 250

## 2023-07-24 MED ORDER — FENTANYL CITRATE (PF) 100 MCG/2ML IJ SOLN
25.0000 ug | INTRAMUSCULAR | Status: DC | PRN
  Administered 2023-07-24: 25 ug via INTRAVENOUS

## 2023-07-24 MED ORDER — 0.9 % SODIUM CHLORIDE (POUR BTL) OPTIME
TOPICAL | Status: DC | PRN
  Administered 2023-07-24: 1000 mL

## 2023-07-24 MED ORDER — DOCUSATE SODIUM 100 MG PO CAPS
100.0000 mg | ORAL_CAPSULE | Freq: Every day | ORAL | Status: DC
Start: 2023-07-25 — End: 2023-07-26
  Administered 2023-07-25 – 2023-07-26 (×2): 100 mg via ORAL
  Filled 2023-07-24 (×2): qty 1

## 2023-07-24 MED ORDER — METOPROLOL TARTRATE 5 MG/5ML IV SOLN: 2.0000 mg | INTRAVENOUS | Status: DC | PRN

## 2023-07-24 MED ORDER — CLEVIDIPINE BUTYRATE 0.5 MG/ML IV EMUL
INTRAVENOUS | Status: AC
Start: 2023-07-24 — End: ?
  Filled 2023-07-24: qty 50

## 2023-07-24 MED ORDER — NALOXONE HCL 0.4 MG/ML IJ SOLN
INTRAMUSCULAR | Status: DC | PRN
  Administered 2023-07-24 (×2): 40 ug via INTRAVENOUS

## 2023-07-24 MED ORDER — PROPOFOL 1000 MG/100ML IV EMUL
INTRAVENOUS | Status: AC
  Filled 2023-07-24: qty 100

## 2023-07-24 MED ORDER — OXYCODONE-ACETAMINOPHEN 5-325 MG PO TABS
1.0000 | ORAL_TABLET | ORAL | Status: DC | PRN
Start: 2023-07-24 — End: 2023-07-26
  Administered 2023-07-25: 2 via ORAL
  Filled 2023-07-24: qty 2

## 2023-07-24 MED ORDER — SURGIFLO WITH THROMBIN (HEMOSTATIC MATRIX KIT) OPTIME
TOPICAL | Status: DC | PRN
  Administered 2023-07-24: 1 via TOPICAL

## 2023-07-24 MED ORDER — BISACODYL 10 MG RE SUPP: 10.0000 mg | Freq: Every day | RECTAL | Status: DC | PRN

## 2023-07-24 MED ORDER — GLYCOPYRROLATE PF 0.2 MG/ML IJ SOSY
PREFILLED_SYRINGE | INTRAMUSCULAR | Status: AC
  Filled 2023-07-24: qty 1

## 2023-07-24 MED ORDER — PHENOL 1.4 % MT LIQD: 1.0000 | OROMUCOSAL | Status: DC | PRN

## 2023-07-24 SURGICAL SUPPLY — 49 items
BAG BANDED W/RUBBER/TAPE 36X54 (MISCELLANEOUS) ×1 IMPLANT
BAG COUNTER SPONGE SURGICOUNT (BAG) ×1 IMPLANT
BALLN STERLING RX (BALLOONS) ×1
BALLN STERLING RX 4X30X80 (BALLOONS) ×1
BALLN STERLING RX 5X30X80 (BALLOONS) ×1
BALLOON STERLING RX (BALLOONS) IMPLANT
BALLOON STERLING RX 4X30X80 (BALLOONS) IMPLANT
BALLOON STERLING RX 5X30X80 (BALLOONS) IMPLANT
CANISTER SUCT 3000ML PPV (MISCELLANEOUS) ×1 IMPLANT
CATH BALLN ENROUTE 5X35 (CATHETERS) IMPLANT
CATH SUCT 10FR WHISTLE TIP (CATHETERS) ×1 IMPLANT
CLIP TI MEDIUM 6 (CLIP) ×1 IMPLANT
CLIP TI WIDE RED SMALL 6 (CLIP) ×1 IMPLANT
COVER DOME SNAP 22 D (MISCELLANEOUS) ×1 IMPLANT
COVER PROBE W GEL 5X96 (DRAPES) ×1 IMPLANT
DERMABOND ADVANCED .7 DNX12 (GAUZE/BANDAGES/DRESSINGS) ×1 IMPLANT
DRAPE FEMORAL ANGIO 80X135IN (DRAPES) ×1 IMPLANT
ELECT REM PT RETURN 9FT ADLT (ELECTROSURGICAL) ×1
ELECTRODE REM PT RTRN 9FT ADLT (ELECTROSURGICAL) ×1 IMPLANT
GLOVE SURG SS PI 7.5 STRL IVOR (GLOVE) ×3 IMPLANT
GOWN STRL REUS W/ TWL LRG LVL3 (GOWN DISPOSABLE) ×2 IMPLANT
GOWN STRL REUS W/ TWL XL LVL3 (GOWN DISPOSABLE) ×1 IMPLANT
GUIDEWIRE ENROUTE 0.014 (WIRE) ×1 IMPLANT
HEMOSTAT SNOW SURGICEL 2X4 (HEMOSTASIS) IMPLANT
KIT BASIN OR (CUSTOM PROCEDURE TRAY) ×1 IMPLANT
KIT ENCORE 26 ADVANTAGE (KITS) ×1 IMPLANT
KIT INTRODUCER GALT 7 (INTRODUCER) ×1 IMPLANT
KIT TURNOVER KIT B (KITS) ×1 IMPLANT
NDL HYPO 25GX1X1/2 BEV (NEEDLE) IMPLANT
NEEDLE HYPO 25GX1X1/2 BEV (NEEDLE)
PACK CAROTID (CUSTOM PROCEDURE TRAY) ×1 IMPLANT
POSITIONER HEAD DONUT 9IN (MISCELLANEOUS) ×1 IMPLANT
SET MICROPUNCTURE 5F STIFF (MISCELLANEOUS) ×1 IMPLANT
STENT TRANSCAROTID SYSTEM 8X40 (Permanent Stent) IMPLANT
SURGIFLO W/THROMBIN 8M KIT (HEMOSTASIS) IMPLANT
SUT PROLENE 5 0 C 1 24 (SUTURE) ×2 IMPLANT
SUT SILK 2 0 PERMA HAND 18 BK (SUTURE) ×1 IMPLANT
SUT SILK 2 0 SH (SUTURE) ×1 IMPLANT
SUT VIC AB 3-0 SH 27X BRD (SUTURE) ×2 IMPLANT
SUT VIC AB 4-0 PS2 27 (SUTURE) ×1 IMPLANT
SYR 10ML LL (SYRINGE) ×3 IMPLANT
SYR 20ML LL LF (SYRINGE) ×1 IMPLANT
SYR CONTROL 10ML LL (SYRINGE) IMPLANT
SYSTEM TRANSCAROTID NEUROPRTCT (MISCELLANEOUS) ×1 IMPLANT
TOWEL GREEN STERILE (TOWEL DISPOSABLE) ×1 IMPLANT
TRANSCAROTID NEUROPROTECT SYS (MISCELLANEOUS) ×1
TUBE CONNECTING 12X1/4 (SUCTIONS) IMPLANT
WATER STERILE IRR 1000ML POUR (IV SOLUTION) ×1 IMPLANT
WIRE BENTSON .035X145CM (WIRE) ×1 IMPLANT

## 2023-07-24 NOTE — Interval H&P Note (Signed)
History and Physical Interval Note:  07/24/2023 7:30 AM  [name]  has presented today for surgery, with the diagnosis of carotid stenosis.  The various methods of treatment have been discussed with the patient and family. After consideration of risks, benefits and other options for treatment, the patient has consented to  Procedure(s): RIGHT TRANSCAROTID ARTERY REVASCULARIZATION (Right) as a surgical intervention.  The patient's history has been reviewed, patient examined, no change in status, stable for surgery.  I have reviewed the patient's chart and labs.  Questions were answered to the patient's satisfaction.     [name]

## 2023-07-24 NOTE — Op Note (Signed)
Patient name: [name] MRN: [id number] DOB: 06/13/1951 Sex: female  07/24/2023 Pre-operative Diagnosis: Asymptomatic right carotid stenosis Post-operative diagnosis:  Same Surgeon:  [name] Assistants: Doreatha Massed Procedure:   #1: Right carotid stent (TCAR)   #2: Ultrasound-guided access, left femoral vein   #3: Retrograde flow reversal neuroprotection Anesthesia:  general Blood Loss:  minimal Specimens:  none  Findings: Calcified bifurcation which made getting access into the artery somewhat difficult.  I used a 3 mm followed by 4 mm followed by 5 mm balloon to predilate the lesion.  After stenting, there was minimal residual stenosis  Stent: 8 x 40 Predilatation balloon: 3 x 30, 4 x 30, 5 x 30 Postdilatation balloon: None Flow reversal time: 21 minutes Contrast: 15 cc Fluoroscopy: 6.8 minutes Procedure time: 57 minutes Dose area: 1014.82  Indications: This is a 73 year old female with history of left carotid stenting who has developed progressive high-grade right carotid stenosis.  Her bifurcation is high and unfavorable for endarterectomy and so after informed discussion about the treatment options, we decided to proceed with right sided TCAR.  Procedure:  The patient was identified in the holding area and taken to [facility] OR ROOM 16  The patient was then placed supine on the table. general anesthesia was administered.  The patient was prepped and draped in the usual sterile fashion.  A time out was called and antibiotics were administered.  A PA was necessary to expedite the procedure and assist with technical details.  She helped exposure by providing suction and retraction.  She helped with wire exchanges, and sheath removal.  Ultrasound was used to evaluate the right common femoral vein which was widely patent.  It was cannulated under ultrasound guidance with a micropuncture needle.  A 018 wire was inserted followed by placement of a micropuncture sheath.  Next a  Bentson wire was placed and the TCAR sheath was inserted without difficulty.  It flushed and aspirated without issue.  Next a transverse incision was made above the clavicle within the 2 heads of the sternocleidomastoid.  Cautery was used to divide subcutaneous tissue and platysma muscle.  I then exposed the internal jugular vein.  The vagus nerve was situated between the internal jugular vein and the carotid artery.  It was protected throughout the exposure.  I then dissected out the common carotid artery which was disease-free.  The patient was fully heparinized.  A 5-0 Prolene U-stitch was placed in the adventitia of the carotid artery.  This was then cannulated with a micropuncture needle.  A 018 wire was inserted up to the mark on the wire.  A micropuncture sheath was placed to 3 cm.  A contrast injection was performed locating the carotid bifurcation which was marked on the screen.  Next, the carotid Amplatz was inserted.  The TCAR sheath was then placed under direct vision.  The wire was then removed.  The TCAR sheath was sutured to the skin.  The flow reversal tubing was then connected.  Passive flow reversal was confirmed with a saline flush.  Next a TCAR timeout was called.  Hemodynamics were appropriate, as well as the ACT.  Active flow reversal was initiated by tightening the vessel loop around the proximal common carotid artery.  This was confirmed with a saline flush.  A repeat injection was performed to define her anatomy.  Next using a 018 wire, the internal carotid artery was selected.  I initially tried to insert a 5 x 30 balloon however this  kept on hitting off of the bifurcation plaque and would not advance.  I then used a 3 x 40 Sterling balloon.  With some maneuvering this was able to the advanced into the internal carotid artery.  It was then inflated.  There was a decrease in heart rate.  Next, I again tried to insert the 5 mm balloon but again this would not advance through the lesion.   I then used a 4 x 40 Sterling balloon which was able to be advanced and inflated.  I then finally used a 5 x 40 Sterling balloon which was able to dilate the lesion.  I then selected an 8 x 40 ENROUTE stent.  This went across the bifurcation easily.  It was positioned and then deployed.  I waited 2 minutes and performed completion angiography in multiple views that showed residual stenosis less than 10%.  I was satisfied with these results.  The wire was removed.  Flow reversal was discontinued and the blood was returned to the sheath in the groin.  The TCAR sheath in the carotid artery was removed and the previously placed 5-0 Prolene U-stitch was used to close the arteriotomy.  Hand-held Doppler confirmed excellent signals in the common carotid artery.  The patient's heparin was reversed with 50 mg of protamine.  The sheath in the groin was removed and manual pressure was held for hemostasis.  Surgiflo was placed in the neck incision.  The platysma muscle was reapproximated with 3-0 Vicryl.  The skin was closed with 4-0 Vicryl and Dermabond.  The patient was then successfully extubated and found to be moving all 4 extremities to command.   Disposition: To PACU stable.   Juleen China, M.D., [facility] [place] Office: [phone] Pager:  [phone]

## 2023-07-24 NOTE — Anesthesia Procedure Notes (Addendum)
Procedure Name: Intubation Date/Time: 07/24/2023 7:52 AM  Performed by: Sharyn Dross, CRNAPre-anesthesia Checklist: Patient identified, Emergency Drugs available, Suction available and Patient being monitored Patient Re-evaluated:Patient Re-evaluated prior to induction Oxygen Delivery Method: Circle system utilized Preoxygenation: Pre-oxygenation with 100% oxygen Induction Type: IV induction and Cricoid Pressure applied Ventilation: Mask ventilation without difficulty Laryngoscope Size: Mac and 3 Grade View: Grade II Tube type: Oral Tube size: 7.0 mm Number of attempts: 1 Airway Equipment and Method: Stylet and Oral airway Placement Confirmation: ETT inserted through vocal cords under direct vision, positive ETCO2 and breath sounds checked- equal and bilateral Secured at: 22 cm Tube secured with: Tape Dental Injury: Teeth and Oropharynx as per pre-operative assessment  Comments: Intubated by Kenney Houseman, SRNA

## 2023-07-24 NOTE — Progress Notes (Addendum)
  Day of Surgery Note    Subjective:  awake in recovery   Vitals:   07/24/23 0558  BP: 126/74  Pulse: 70  Resp: 18  Temp: 98.3 F (36.8 C)  SpO2: 98%    Incisions:   right neck incision and left groin are clean without hematoma Extremities:  moving all extremities equally Lungs:  non labored    Assessment/Plan:  This is a 73 y.o. female who is s/p  Right TCAR for asymptomatic carotid artery stenosis  -pt neuro in tact in recovery.  Incision and left groin look fine without hematoma. -blood pressures are soft-hold antihypertensives.  Receiving IVF @ 75cc/hr -asa/plavix/statin restarted and will plan to restart Eliquis on 07/26/2023.  -she will take Plavix x 30 days and then discontinue and be on Eliquis and asa only. -plan to 4 east later today   [employer], PA-C 07/24/2023 10:01 AM [phone]

## 2023-07-24 NOTE — Transfer of Care (Signed)
Immediate Anesthesia Transfer of Care Note  Patient: [name]  Procedure(s) Performed: RIGHT TRANSCAROTID ARTERY REVASCULARIZATION (Right)  Patient Location: PACU  Anesthesia Type:General  Level of Consciousness: awake, alert , and oriented  Airway & Oxygen Therapy: Patient Spontanous Breathing and Patient connected to face mask oxygen  Post-op Assessment: Report given to RN and Post -op Vital signs reviewed and stable  Post vital signs: Reviewed and stable  Last Vitals:  Vitals Value Taken Time  BP 106/55 07/24/23 0949  Temp    Pulse 57 07/24/23 0954  Resp 21 07/24/23 0954  SpO2 100 % 07/24/23 0954  Vitals shown include unfiled device data.  Last Pain:  Vitals:   07/24/23 4098  TempSrc:   PainSc: 0-No pain         Complications: There were no known notable events for this encounter.

## 2023-07-24 NOTE — Discharge Instructions (Addendum)
[facility]  Discharge Instructions   Carotid Surgery  Please refer to the following instructions for your post-procedure care. Your surgeon or physician assistant will discuss any changes with you.  Activity  You are encouraged to walk as much as you can. You can slowly return to normal activities but must avoid strenuous activity and heavy lifting until your doctor tell you it's okay. Avoid activities such as vacuuming or swinging a golf club. You can drive after one week if you are comfortable and you are no longer taking prescription pain medications. It is normal to feel tired for serval weeks after your surgery. It is also normal to have difficulty with sleep habits, eating, and bowel movements after surgery. These will go away with time.  Bathing/Showering  Shower daily after you go home. Do not soak in a bathtub, hot tub, or swim until the incision heals completely.  Incision Care  Shower every day. Clean your incision with mild soap and water. Pat the area dry with a clean towel. You do not need a bandage unless otherwise instructed. Do not apply any ointments or creams to your incision. You may have skin glue on your incision. Do not peel it off. It will come off on its own in about one week. Your incision may feel thickened and raised for several weeks after your surgery. This is normal and the skin will soften over time.   For Men Only: It's okay to shave around the incision but do not shave the incision itself for 2 weeks. It is common to have numbness under your chin that could last for several months.  Diet  Resume your normal diet. There are no special food restrictions following this procedure. A low fat/low cholesterol diet is recommended for all patients with vascular disease. In order to heal from your surgery, it is CRITICAL to get adequate nutrition. Your body requires vitamins, minerals, and protein. Vegetables are the best source of  vitamins and minerals. Vegetables also provide the perfect balance of protein. Processed food has little nutritional value, so try to avoid this.  Medications  Resume taking all of your medications unless your doctor or physician assistant tells you not to. If your incision is causing pain, you may take over-the- counter pain relievers such as acetaminophen (Tylenol). If you were prescribed a stronger pain medication, please be aware these medications can cause nausea and constipation. Prevent nausea by taking the medication with a snack or meal. Avoid constipation by drinking plenty of fluids and eating foods with a high amount of fiber, such as fruits, vegetables, and grains.  Do not take Tylenol if you are taking prescription pain medications.  Restart Eliquis on 07/26/2023.  Take Plavix for one month then stop taking.  Follow Up  Our office will schedule a follow up appointment 2-3 weeks following discharge.  Please call us immediately for any of the following conditions  Increased pain, redness, drainage (pus) from your incision site. Fever of 101 degrees or higher. If you should develop stroke (slurred speech, difficulty swallowing, weakness on one side of your body, loss of vision) you should call 911 and go to the nearest emergency room.  Reduce your risk of vascular disease:  Stop smoking. If you would like help call [facility] at 1-800-QUIT-NOW (401-446-0321) or [place] at [phone]. Manage your cholesterol Maintain a desired weight Control your diabetes Keep your blood pressure down  If you have any questions, please call the office at  [phone].  

## 2023-07-24 NOTE — Anesthesia Postprocedure Evaluation (Signed)
Anesthesia Post Note  Patient: [name]  Procedure(s) Performed: RIGHT TRANSCAROTID ARTERY REVASCULARIZATION (Right)     Patient location during evaluation: PACU Anesthesia Type: General Level of consciousness: awake and alert Pain management: pain level controlled Vital Signs Assessment: post-procedure vital signs reviewed and stable Respiratory status: spontaneous breathing, nonlabored ventilation, respiratory function stable and patient connected to nasal cannula oxygen Cardiovascular status: blood pressure returned to baseline and stable Postop Assessment: no apparent nausea or vomiting Anesthetic complications: no  There were no known notable events for this encounter.  Last Vitals:  Vitals:   07/24/23 1549 07/24/23 1639  BP:  (!) 99/54  Pulse: 62 (!) 42  Resp:  19  Temp:  (!) 36.4 C  SpO2:  94%    Last Pain:  Vitals:   07/24/23 1639  TempSrc: Oral  PainSc:                  Kennieth Rad

## 2023-07-24 NOTE — Anesthesia Procedure Notes (Signed)
Arterial Line Insertion Start/End1/22/2025 7:05 AM, 07/24/2023 7:10 AM Performed by: Marcene Duos, MD, Sharyn Dross, CRNA, CRNA  Patient location: Pre-op. Preanesthetic checklist: patient identified, IV checked, site marked, risks and benefits discussed, surgical consent, monitors and equipment checked, pre-op evaluation, timeout performed and anesthesia consent Lidocaine 1% used for infiltration Left, radial was placed Catheter size: 20 G Hand hygiene performed  and maximum sterile barriers used   Attempts: 2 Procedure performed without using ultrasound guided technique. Following insertion, dressing applied. Post procedure assessment: normal and unchanged  Patient tolerated the procedure well with no immediate complications.

## 2023-07-24 NOTE — Progress Notes (Signed)
Pt arrived from ...PACU.., A/ox ..4.pt denies any pain, MD aware,CCMD called. CHG bath given,no further needs at this time    

## 2023-07-25 ENCOUNTER — Encounter (HOSPITAL_COMMUNITY): Payer: Self-pay | Admitting: Surgery

## 2023-07-25 ENCOUNTER — Encounter (HOSPITAL_COMMUNITY): Payer: Self-pay

## 2023-07-25 LAB — CBC
HCT: 33.3 % — ABNORMAL LOW (ref 36.0–46.0)
Hemoglobin: 11.3 g/dL — ABNORMAL LOW (ref 12.0–15.0)
MCH: 31.7 pg (ref 26.0–34.0)
MCHC: 33.9 g/dL (ref 30.0–36.0)
MCV: 93.3 fL (ref 80.0–100.0)
Platelets: 219 10*3/uL (ref 150–400)
RBC: 3.57 MIL/uL — ABNORMAL LOW (ref 3.87–5.11)
RDW: 14.3 % (ref 11.5–15.5)
WBC: 10 10*3/uL (ref 4.0–10.5)
nRBC: 0 % (ref 0.0–0.2)

## 2023-07-25 LAB — GLUCOSE, CAPILLARY
Glucose-Capillary: 165 mg/dL — ABNORMAL HIGH (ref 70–99)
Glucose-Capillary: 166 mg/dL — ABNORMAL HIGH (ref 70–99)
Glucose-Capillary: 114 mg/dL — ABNORMAL HIGH (ref 70–99)
Glucose-Capillary: 117 mg/dL — ABNORMAL HIGH (ref 70–99)

## 2023-07-25 LAB — LIPID PANEL
Cholesterol: 90 mg/dL (ref 0–200)
HDL: 48 mg/dL (ref >40)
LDL Cholesterol: 35 mg/dL (ref 0–99)
Total CHOL/HDL Ratio: 1.9 {ratio}
Triglycerides: 34 mg/dL (ref <150)
VLDL: 7 mg/dL (ref 0–40)

## 2023-07-25 LAB — BASIC METABOLIC PANEL
Anion gap: 11 (ref 5–15)
BUN: 22 mg/dL (ref 8–23)
CO2: 19 mmol/L — ABNORMAL LOW (ref 22–32)
Calcium: 8.5 mg/dL — ABNORMAL LOW (ref 8.9–10.3)
Chloride: 103 mmol/L (ref 98–111)
Creatinine, Ser: 1.6 mg/dL — ABNORMAL HIGH (ref 0.44–1.00)
GFR, Estimated: 34 mL/min — ABNORMAL LOW (ref >60)
Glucose, Bld: 172 mg/dL — ABNORMAL HIGH (ref 70–99)
Potassium: 3.5 mmol/L (ref 3.5–5.1)
Sodium: 133 mmol/L — ABNORMAL LOW (ref 135–145)

## 2023-07-25 LAB — POCT ACTIVATED CLOTTING TIME: Activated Clotting Time: 233 s

## 2023-07-25 MED ORDER — PSEUDOEPHEDRINE HCL 30 MG PO TABS
60.0000 mg | ORAL_TABLET | Freq: Four times a day (QID) | ORAL | Status: DC
Start: 2023-07-25 — End: 2023-07-26
  Administered 2023-07-25 – 2023-07-26 (×5): 60 mg via ORAL
  Filled 2023-07-25 (×5): qty 2
  Filled 2023-07-25: qty 1
  Filled 2023-07-25: qty 2

## 2023-07-25 NOTE — Progress Notes (Addendum)
Progress Note    07/25/2023 6:38 AM 1 Day Post-Op  Subjective:  no complaints.  Says when she ate dinner, her throat was sore, but did not have any trouble swallowing.  RN reports HR down to 30's while sleeping  Afebrile HR 40's-60's  90's-140's systolic 86-99% 4LO2NC  Gtts:   Vitals:   07/25/23 0139 07/25/23 0317  BP: 136/65 129/72  Pulse: (!) 52 67  Resp: 17 20  Temp: 97.7 F (36.5 C) 97.6 F (36.4 C)  SpO2: 94% 94%     Physical Exam: Neuro:  in tact Lungs:  non labored Incision:  right neck clean and dry without hematoma; left groin soft without hematoma  CBC    Component Value Date/Time   WBC 10.0 07/25/2023 0522   RBC 3.57 (L) 07/25/2023 0522   HGB 11.3 (L) 07/25/2023 0522   HGB 12.4 07/18/2022 1606   HCT 33.3 (L) 07/25/2023 0522   HCT 37.8 07/18/2022 1606   PLT 219 07/25/2023 0522   PLT 267 07/18/2022 1606   MCV 93.3 07/25/2023 0522   MCV 95 07/18/2022 1606   MCH 31.7 07/25/2023 0522   MCHC 33.9 07/25/2023 0522   RDW 14.3 07/25/2023 0522   RDW 13.3 07/18/2022 1606   LYMPHSABS 1.9 07/17/2017 1259   MONOABS 0.5 07/17/2017 1259   EOSABS 0.3 07/17/2017 1259   BASOSABS 0.0 07/17/2017 1259    BMET    Component Value Date/Time   NA 141 07/18/2023 1100   NA 142 07/18/2022 1606   K 3.6 07/18/2023 1100   CL 105 07/18/2023 1100   CO2 28 07/18/2023 1100   GLUCOSE 120 (H) 07/18/2023 1100   BUN 20 07/18/2023 1100   BUN 16 07/18/2022 1606   CREATININE 1.65 (H) 07/18/2023 1100   CREATININE 2.08 (H) 07/17/2017 1259   CREATININE 1.22 (H) 11/30/2013 1014   CALCIUM 10.0 07/18/2023 1100   GFRNONAA 33 (L) 07/18/2023 1100   GFRNONAA 24 (L) 07/17/2017 1259   GFRAA 27 (L) 07/17/2017 1259     Intake/Output Summary (Last 24 hours) at 07/25/2023 0638 Last data filed at 07/25/2023 0300 Gross per 24 hour  Intake 2794.3 ml  Output 50 ml  Net 2744.3 ml     Assessment/Plan:  This is a 73 y.o. female who is s/p right TCAR  1 Day Post-Op  -pt is doing well this  am.  She had bradycardia overnight while sleeping with HR down to the mid 30's but non sustained.  She was started on Dopamine .  Currently while I was in the room, systolic pressure 128 and HR 60.  I have asked the RN to cut the Dopamine down to 2.5 mcg and we will see how she tolerates this.  Keep a -line until off Dopamine. -pt neuro exam is in tact; no dysphagia  -continue asa/plavix/statin.  She will restart her Eliquis in the next day or two.  She will take plavix x one month then discontinue and continue with asa and Eliquis thereafter.  -probably needs another day.  f/u with [facility] in 4 weeks with carotid duplex on Dr. Myra Gianotti clinic day.   Doreatha Massed, PA-C [facility] [phone]  I agree with the above.  Have seen evaluate the patient.  She is postoperative day #1 status post right sided TCAR.  She required dopamine overnight.  She complains of tenderness at her incision site.  She is neurologically intact.  Will plan on weaning her off of the dopamine and hopefully discharge tomorrow.  Will start pseudoephedrine today to try to get her dopamine off  [name]  [name]

## 2023-07-25 NOTE — Discharge Summary (Signed)
Discharge Summary     [name] [birth year] 73 y.o. female  [id number]  Admission Date: 07/24/2023  Discharge Date: 07/26/2023  Physician: Nada Libman, MD  Admission Diagnosis: Carotid stenosis, right [I65.21] Asymptomatic carotid artery stenosis without infarction, right [I65.21]   Indication:This is a 73 year old female with history of left carotid stenting who has developed progressive high-grade right carotid stenosis. Her bifurcation is high and unfavorable for endarterectomy and so after informed discussion about the treatment options, we decided to proceed with right sided TCAR.     Hospital Course:  The patient was admitted to the hospital and taken to the operating room on 07/24/2023 and underwent right transcarotid artery revascularization by Dr. Myra Gianotti.     Findings: Calcified bifurcation which made getting access into the artery somewhat difficult. I used a 3 mm followed by 4 mm followed by 5 mm balloon to predilate the lesion. After stenting, there was minimal residual stenosis   The pt tolerated the procedure well and was transported to the PACU in good condition. She did have some bradycardia overnight with HR in the mid 30's, non sustained. She was started on Dopamine.   By POD 1, the pt neuro status remained intact. Mild incisional tenderness and sore throat but otherwise doing well.  Able to wean her off of dopamine. Started Pseudoephedrine. She remained hemodynamically stable. Kept her for additional night of observation  By POD#2, she remained stable for discharge home. Blood pressure improved. Off of dopamine. Neurologically intact. Tolerating diet. Ambulated with assistance. She was given parameters about restarting her home antihypertensive medications. She will continue on Aspirin, statin and Plavix. She will resume her home Eliquis. She will remain on Triple therapy for 1 month and then we will discontinue her Asprin. She has follow up arranged  in 4 weeks with carotid duplex  Recent Labs    07/25/23 0522  NA 133*  K 3.5  CL 103  CO2 19*  GLUCOSE 172*  BUN 22  CALCIUM 8.5*   Recent Labs    07/25/23 0522  WBC 10.0  HGB 11.3*  HCT 33.3*  PLT 219   No results for input(s): "INR" in the last 72 hours.   Discharge Instructions     CAROTID Sugery: Call MD for difficulty swallowing or speaking; weakness in arms or legs that is a new symtom; severe headache.  If you have increased swelling in the neck and/or  are having difficulty breathing, CALL 911   Complete by: As directed    Call MD for:  redness, tenderness, or signs of infection (pain, swelling, bleeding, redness, odor or green/yellow discharge around incision site)   Complete by: As directed    Call MD for:  severe or increased pain, loss or decreased feeling  in affected limb(s)   Complete by: As directed    Call MD for:  temperature >100.5   Complete by: As directed    Discharge instructions   Complete by: As directed    If you have a home blood pressure monitor, you can restart your home blood pressure medication when your systolic number (top #) is > 130   Driving Restrictions   Complete by: As directed    No driving for 1 week or while taking narcotic pain medication   Increase activity slowly   Complete by: As directed    Walk with assistance use walker or cane as needed   Lifting restrictions   Complete by: As directed    No lifting for  2 weeks   Resume previous diet   Complete by: As directed    may wash over wound with mild soap and water   Complete by: As directed        Discharge Diagnosis:  Carotid stenosis, right [I65.21] Asymptomatic carotid artery stenosis without infarction, right [I65.21]  Secondary Diagnosis: Patient Active Problem List   Diagnosis Date Noted   Carotid stenosis, right 07/24/2023   Asymptomatic carotid artery stenosis without infarction, right 07/24/2023   Cystoid macular edema of left eye following cataract  surgery 05/16/2020   Cystoid macular edema of left eye 05/16/2020   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   Nuclear sclerotic cataract of right eye 05/16/2020   Heme positive stool 04/19/2020   Ductal carcinoma in situ (DCIS) of left breast 07/10/2017   Hx of adenomatous polyp of colon 07/26/2016   Abnormal nuclear stress test 12/18/2013   Right renal artery stenosis s/p stent 11/23/2013   Bradycardia 02/13/2012   PAF (paroxysmal atrial fibrillation) (HCC) 02/13/2012   PVD (peripheral vascular disease) (HCC) 02/13/2012   CAD, CABG X 3 2004, distal LAD/OM disease 2005, Myoview low risk 7/12 02/13/2012   Diabetes mellitus, Type 2 NIDDM 02/13/2012   HTN (hypertension) 02/13/2012   Dyslipidemia 02/13/2012   Chronic anticoagulation 02/13/2012   Carotid stenosis 01/28/2012   Past Medical History:  Diagnosis Date   Acute kidney failure (HCC)    ASHD (arteriosclerotic heart disease)    native vessel   Atrial fibrillation (HCC)    Breast cancer, left (HCC)    CAD (coronary artery disease) 2004   multivessel, status post CABG x3   Carotid artery disease (HCC)    stent in 2013   CKD (chronic kidney disease), stage III (HCC)    Diabetes (HCC)    GERD (gastroesophageal reflux disease)    History of tobacco abuse    Hx of adenomatous polyp of colon 07/26/2016   Hyperlipidemia    Hypertension    Hyperthyroidism    Myocardial infarction [facility]) 2002   Peripheral vascular disease (HCC)    S/P popliteal artery and renal artery stents   Renovascular hypertension    S/P CABG x 3 2004   Thyroid goiter    Tobacco abuse    Vaginal cancer (HCC) 1983    Allergies as of 07/26/2023   No Known Allergies      Medication List     TAKE these medications    amLODipine 10 MG tablet Commonly known as: NORVASC TAKE 1 TABLET EVERY DAY   aspirin EC 81 MG tablet Take 1 tablet (81 mg total) by mouth daily. Swallow whole.   clopidogrel 75 MG tablet Commonly known as: PLAVIX Take 1  tablet (75 mg total) by mouth daily.   Eliquis 5 MG Tabs tablet Generic drug: apixaban Take 1 tablet by mouth twice daily   ketorolac 0.5 % ophthalmic solution Commonly known as: ACULAR INSTILL 1 DROP IN LEFT EYE TWICE DAILY   lansoprazole 15 MG capsule Commonly known as: PREVACID Take 1 capsule (15 mg total) by mouth daily.   metFORMIN 1000 MG tablet Commonly known as: GLUCOPHAGE Take 1 tablet (1,000 mg total) by mouth 2 (two) times daily with a meal.   methimazole 10 MG tablet Commonly known as: TAPAZOLE Take 5 mg by mouth daily.   metoprolol tartrate 25 MG tablet Commonly known as: LOPRESSOR Take 1 tablet (25 mg total) by mouth 2 (two) times daily.   nitroGLYCERIN 0.4 MG SL tablet Commonly known as: NITROSTAT  Place 1 tablet (0.4 mg total) under the tongue every 5 (five) minutes as needed for chest pain.   oxyCODONE-acetaminophen 5-325 MG tablet Commonly known as: PERCOCET/ROXICET Take 1 tablet by mouth every 6 (six) hours as needed for moderate pain (pain score 4-6).   rosuvastatin 10 MG tablet Commonly known as: CRESTOR TAKE 1 TABLET EVERY DAY   triamterene-hydrochlorothiazide 37.5-25 MG tablet Commonly known as: MAXZIDE-25 TAKE 1 TABLET EVERY DAY   valsartan 160 MG tablet Commonly known as: DIOVAN TAKE 1 TABLET TWICE DAILY (NEED MD APPOINTMENT WITH CARDIOLOGIST)         [facility] Discharge Instructions Carotid Endarterectomy (CEA)  Please refer to the following instructions for your post-procedure care. Your surgeon or physician assistant will discuss any changes with you.  Activity  You are encouraged to walk as much as you can. You can slowly return to normal activities but must avoid strenuous activity and heavy lifting until your doctor tell you it's OK. Avoid activities such as vacuuming or swinging a golf club. You can drive after one week if you are comfortable and you are no longer taking prescription pain  medications. It is normal to feel tired for serval weeks after your surgery. It is also normal to have difficulty with sleep habits, eating, and bowel movements after surgery. These will go away with time.  Bathing/Showering  You may shower after you come home. Do not soak in a bathtub, hot tub, or swim until the incision heals completely.  Incision Care  Shower every day. Clean your incision with mild soap and water. Pat the area dry with a clean towel. You do not need a bandage unless otherwise instructed. Do not apply any ointments or creams to your incision. You may have skin glue on your incision. Do not peel it off. It will come off on its own in about one week. Your incision may feel thickened and raised for several weeks after your surgery. This is normal and the skin will soften over time. For Men Only: It's OK to shave around the incision but do not shave the incision itself for 2 weeks. It is common to have numbness under your chin that could last for several months.  Diet  Resume your normal diet. There are no special food restrictions following this procedure. A low fat/low cholesterol diet is recommended for all patients with vascular disease. In order to heal from your surgery, it is CRITICAL to get adequate nutrition. Your body requires vitamins, minerals, and protein. Vegetables are the best source of vitamins and minerals. Vegetables also provide the perfect balance of protein. Processed food has little nutritional value, so try to avoid this.  Medications  Resume taking all of your medications unless your doctor or physician assistant tells you not to.  If your incision is causing pain, you may take over-the- counter pain relievers such as acetaminophen (Tylenol). If you were prescribed a stronger pain medication, please be aware these medications can cause nausea and constipation.  Prevent nausea by taking the medication with a snack or meal. Avoid constipation by drinking plenty  of fluids and eating foods with a high amount of fiber, such as fruits, vegetables, and grains.  Do not take Tylenol if you are taking prescription pain medications.  Follow Up  Our office will schedule a follow up appointment 2-3 weeks following discharge.  Please call us immediately for any of the following conditions  Increased pain, redness, drainage (pus) from your incision  site. Fever of 101 degrees or higher. If you should develop stroke (slurred speech, difficulty swallowing, weakness on one side of your body, loss of vision) you should call 911 and go to the nearest emergency room.  Reduce your risk of vascular disease:  Stop smoking. If you would like help call [facility] at 1-800-QUIT-NOW (419-062-6494) or [place] at [phone]. Manage your cholesterol Maintain a desired weight Control your diabetes Keep your blood pressure down  If you have any questions, please call the office at [phone].  Prescriptions given: Oxycodone- Acetaminophen #12 no refills  Disposition: Home  Patient's condition: is Good  Follow up: 1. [facility] in 4 weeks with carotid duplex   Doreatha Massed, PA-C [facility] [phone]   --- For [facility] Registry use ---   Modified Rankin score at D/C (0-6): -  IV medication needed for:  1. Hypertension: No 2. Hypotension: Yes  Post-op Complications: No  1. Post-op CVA or TIA: No  If yes: Event classification (right eye, left eye, right cortical, left cortical, verterobasilar, other): n/a  If yes: Timing of event (intra-op, <6 hrs post-op, >=6 hrs post-op, unknown): n/a  2. CN injury: No  If yes: CN not injuried   3. Myocardial infarction: No  If yes: Dx by (EKG or clinical, Troponin): n/a  4.  CHF: No  5.  Dysrhythmia (new): No  6. Wound infection: No  7. Reperfusion symptoms: No  8. Return to OR: No  If yes: return to OR for (bleeding, neurologic, other CEA incision, other): n/a  Discharge  medications: Statin use:  Yes ASA use:  Yes   Beta blocker use:  Yes ACE-Inhibitor use:  No  ARB use:  Yes CCB use: yes P2Y12 Antagonist use: Yes, [ x] Plavix for one month, [ ]  Plasugrel, [ ]  Ticlopinine, [ ]  Ticagrelor, [ ]  Other, [ ]  No for medical reason, [ ]  Non-compliant, [ ]  Not-indicated Anti-coagulant use:  Yes, [ ]  Warfarin, [ ]  Rivaroxaban, [ ]  Dabigatran,  Eliquis

## 2023-07-25 NOTE — Progress Notes (Addendum)
Patient heart reate was in 40's and dropping to 35-36 non sustained at bedside report. Pt was AO x4, neuro intact. Denied any pain. Patient was sitting up and eating and HR was in 50's. As pt started to lay back down HR was in 40's and frequently dropping to 33-36, lasting 1-2 seconds. BP dropped to 84/88,94/43. Pt was Aox4, without any distress, NIH scale was zero. On call MD DR. Brabham called and notified. Received order for 500 cc NS bolus and Dopamine drip at 5 mcg/kg/min. See MAR for new orders. Charge RN aware. Holding PM antihypertensive for tonight. Plan of care continues.

## 2023-07-25 NOTE — Plan of Care (Signed)
  Problem: Education: Goal: Knowledge of discharge needs will improve Outcome: Progressing   Problem: Clinical Measurements: Goal: Postoperative complications will be avoided or minimized Outcome: Progressing   Problem: Respiratory: Goal: Will achieve and/or maintain a regular respiratory rate, without signs or symptoms of dyspnea Outcome: Progressing   Problem: Skin Integrity: Goal: Demonstration of wound healing without infection will improve Outcome: Progressing   Problem: Education: Goal: Knowledge of General Education information will improve Description: Including pain rating scale, medication(s)/side effects and non-pharmacologic comfort measures Outcome: Progressing   Problem: Health Behavior/Discharge Planning: Goal: Ability to manage health-related needs will improve Outcome: Progressing   Problem: Clinical Measurements: Goal: Ability to maintain clinical measurements within normal limits will improve Outcome: Progressing Goal: Will remain free from infection Outcome: Progressing Goal: Diagnostic test results will improve Outcome: Progressing Goal: Respiratory complications will improve Outcome: Progressing Goal: Cardiovascular complication will be avoided Outcome: Progressing   Problem: Activity: Goal: Risk for activity intolerance will decrease Outcome: Progressing   Problem: Nutrition: Goal: Adequate nutrition will be maintained Outcome: Progressing   Problem: Coping: Goal: Level of anxiety will decrease Outcome: Progressing   Problem: Elimination: Goal: Will not experience complications related to bowel motility Outcome: Progressing Goal: Will not experience complications related to urinary retention Outcome: Progressing   Problem: Pain Managment: Goal: General experience of comfort will improve and/or be controlled Outcome: Progressing   Problem: Safety: Goal: Ability to remain free from injury will improve Outcome: Progressing   Problem:  Skin Integrity: Goal: Risk for impaired skin integrity will decrease Outcome: Progressing   Problem: Education: Goal: Ability to describe self-care measures that may prevent or decrease complications (Diabetes Survival Skills Education) will improve Outcome: Progressing Goal: Individualized Educational Video(s) Outcome: Progressing   Problem: Coping: Goal: Ability to adjust to condition or change in health will improve Outcome: Progressing   Problem: Fluid Volume: Goal: Ability to maintain a balanced intake and output will improve Outcome: Progressing   Problem: Health Behavior/Discharge Planning: Goal: Ability to identify and utilize available resources and services will improve Outcome: Progressing Goal: Ability to manage health-related needs will improve Outcome: Progressing   Problem: Metabolic: Goal: Ability to maintain appropriate glucose levels will improve Outcome: Progressing   Problem: Nutritional: Goal: Maintenance of adequate nutrition will improve Outcome: Progressing Goal: Progress toward achieving an optimal weight will improve Outcome: Progressing   Problem: Skin Integrity: Goal: Risk for impaired skin integrity will decrease Outcome: Progressing   Problem: Tissue Perfusion: Goal: Adequacy of tissue perfusion will improve Outcome: Progressing

## 2023-07-26 LAB — GLUCOSE, CAPILLARY
Glucose-Capillary: 96 mg/dL (ref 70–99)
Glucose-Capillary: 115 mg/dL — ABNORMAL HIGH (ref 70–99)

## 2023-07-26 MED ORDER — APIXABAN 5 MG PO TABS
5.0000 mg | ORAL_TABLET | Freq: Two times a day (BID) | ORAL | Status: DC
Start: 2023-07-26 — End: 2023-07-26
  Administered 2023-07-26: 5 mg via ORAL
  Filled 2023-07-26: qty 1

## 2023-07-26 MED ORDER — OXYCODONE-ACETAMINOPHEN 5-325 MG PO TABS: 1.0000 | ORAL_TABLET | Freq: Four times a day (QID) | ORAL | 0 refills | Status: AC | PRN

## 2023-07-26 NOTE — Progress Notes (Signed)
  Progress Note    07/26/2023 8:32 AM 2 Days Post-Op  Subjective:  eating breakfast. Feels good. Ambulated yesterday without difficulty   Vitals:   07/26/23 0536 07/26/23 0810  BP:  108/75  Pulse:  66  Resp: 19 16  Temp:  97.6 F (36.4 C)  SpO2:  96%   Physical Exam: Cardiac:  regular Lungs:  non labored Incisions:  Right neck incision is clean, dry and intact without hematoma Extremities:  moving all extremities without deficits Neurologic: Alert and oriented  CBC    Component Value Date/Time   WBC 10.0 07/25/2023 0522   RBC 3.57 (L) 07/25/2023 0522   HGB 11.3 (L) 07/25/2023 0522   HGB 12.4 07/18/2022 1606   HCT 33.3 (L) 07/25/2023 0522   HCT 37.8 07/18/2022 1606   PLT 219 07/25/2023 0522   PLT 267 07/18/2022 1606   MCV 93.3 07/25/2023 0522   MCV 95 07/18/2022 1606   MCH 31.7 07/25/2023 0522   MCHC 33.9 07/25/2023 0522   RDW 14.3 07/25/2023 0522   RDW 13.3 07/18/2022 1606   LYMPHSABS 1.9 07/17/2017 1259   MONOABS 0.5 07/17/2017 1259   EOSABS 0.3 07/17/2017 1259   BASOSABS 0.0 07/17/2017 1259    BMET    Component Value Date/Time   NA 133 (L) 07/25/2023 0522   NA 142 07/18/2022 1606   K 3.5 07/25/2023 0522   CL 103 07/25/2023 0522   CO2 19 (L) 07/25/2023 0522   GLUCOSE 172 (H) 07/25/2023 0522   BUN 22 07/25/2023 0522   BUN 16 07/18/2022 1606   CREATININE 1.60 (H) 07/25/2023 0522   CREATININE 2.08 (H) 07/17/2017 1259   CREATININE 1.22 (H) 11/30/2013 1014   CALCIUM 8.5 (L) 07/25/2023 0522   GFRNONAA 34 (L) 07/25/2023 0522   GFRNONAA 24 (L) 07/17/2017 1259   GFRAA 27 (L) 07/17/2017 1259    INR    Component Value Date/Time   INR 1.5 (H) 07/18/2023 1100     Intake/Output Summary (Last 24 hours) at 07/26/2023 1610 Last data filed at 07/25/2023 2200 Gross per 24 hour  Intake 805.07 ml  Output --  Net 805.07 ml     Assessment/Plan:  73 y.o. female is s/p Right TCAR 2 Days Post-Op   She continues to do well Hemodynamically stable She is off  of Dopamine. Still getting pseudoephedrine q6 She will continue Aspirin, Statin, Plavix. Restart her Eliquis today Will monitor her bp this morning and have her ambulate  Anticipate discharge home later today She will follow up with [place] in the office in 4 weeks with carotid duplex   Graceann Congress, PA-C [facility] [phone] 07/26/2023 8:32 AM

## 2023-08-02 ENCOUNTER — Telehealth: Payer: Self-pay | Admitting: *Deleted

## 2023-08-02 NOTE — Telephone Encounter (Signed)
Caller: candra wegner (husband) called concern if surgical dressing can come off . Pt has TCAR on 07/24/23 and still has dressings on surgical incisions from hospital. Pt encouraged that dressings need to come off and the surgical areas should be cleaned with soap and water and pat dry. Mrs. Watterson can leave the incision open to air . Encouraged to call back if any further questions or issues .  Concern:  can Surgical dressing come off Procedure: Cartoid Endarterectomy (TCAR)  Next Appt: Post op appointment

## 2023-08-20 ENCOUNTER — Telehealth: Payer: Self-pay | Admitting: Cardiovascular Disease

## 2023-08-20 ENCOUNTER — Encounter (HOSPITAL_COMMUNITY): Payer: Medicare PPO

## 2023-08-20 DIAGNOSIS — I739 Peripheral vascular disease, unspecified: Secondary | ICD-10-CM

## 2023-08-20 NOTE — Telephone Encounter (Signed)
 Pt called to reschedule ABI but next avail date was past auth date of 2/19. Please get auth for reschedule and pt requesting cb

## 2023-08-20 NOTE — Telephone Encounter (Signed)
Noted  New order placed

## 2023-08-21 ENCOUNTER — Ambulatory Visit (HOSPITAL_COMMUNITY): Admission: RE | Admit: 2023-08-21 | Payer: Medicare PPO | Source: Ambulatory Visit

## 2023-08-23 ENCOUNTER — Other Ambulatory Visit: Payer: Self-pay

## 2023-08-23 DIAGNOSIS — I6523 Occlusion and stenosis of bilateral carotid arteries: Secondary | ICD-10-CM

## 2023-08-23 DIAGNOSIS — I739 Peripheral vascular disease, unspecified: Secondary | ICD-10-CM

## 2023-08-26 ENCOUNTER — Ambulatory Visit (HOSPITAL_COMMUNITY)
Admission: RE | Admit: 2023-08-26 | Discharge: 2023-08-26 | Disposition: A | Discharge status: Home / Self Care | Payer: Medicare PPO | Source: Ambulatory Visit | Attending: Cardiovascular Disease | Admitting: Cardiovascular Disease

## 2023-08-26 DIAGNOSIS — I739 Peripheral vascular disease, unspecified: Secondary | ICD-10-CM | POA: Diagnosis not present

## 2023-08-26 LAB — VAS US ABI WITH/WO TBI
Left ABI: 0.94
Right ABI: 0.87

## 2023-09-02 ENCOUNTER — Ambulatory Visit (INDEPENDENT_AMBULATORY_CARE_PROVIDER_SITE_OTHER): Payer: Medicare PPO | Admitting: Physician Assistant

## 2023-09-02 ENCOUNTER — Ambulatory Visit (HOSPITAL_COMMUNITY)
Admission: RE | Admit: 2023-09-02 | Discharge: 2023-09-02 | Disposition: A | Discharge status: Home / Self Care | Payer: Medicare PPO | Source: Ambulatory Visit | Attending: Surgery | Admitting: Surgery

## 2023-09-02 VITALS — BP 131/60 | HR 56 | Temp 98.0°F | Resp 16 | Ht 66.0 in | Wt 180.0 lb

## 2023-09-02 DIAGNOSIS — I6523 Occlusion and stenosis of bilateral carotid arteries: Secondary | ICD-10-CM

## 2023-09-02 NOTE — Progress Notes (Unsigned)
 POST OPERATIVE OFFICE NOTE    CC:  F/u for surgery  HPI:  [name] is a 73 y.o. female who is s/p right TCAR on 07/24/2023 by Dr.Brabham. This was done for asymptomatic critical right ICA stenosis.  She also has a history of left carotid stenting on 02/12/2012 by Dr. Myra Gianotti also for asymptomatic critical stenosis.  She returns today for follow-up.  She says she has been doing well since her surgery.  She denies any pain at her incision site.  She denies any drainage or erythema of her incision.  She denies any fevers or chills.  She remains neurologically intact without any slurred speech, sudden visual changes, facial droop, or sudden weakness/numbness.  She was previously on Eliquis only prior to her surgery.  She was started on aspirin and Plavix as well after stenting.  Dr. Estanislado Spire initial plan was for triple therapy for 1 month and then Plavix could be discontinued.  She is wondering how many of her "blood thinners" she could stop because she is experiencing easy bruising.   No Known Allergies  Current Outpatient Medications  Medication Sig Dispense Refill   amLODipine (NORVASC) 10 MG tablet TAKE 1 TABLET EVERY DAY 90 tablet 3   apixaban (ELIQUIS) 5 MG TABS tablet Take 1 tablet by mouth twice daily 180 tablet 1   aspirin EC 81 MG tablet Take 1 tablet (81 mg total) by mouth daily. Swallow whole. 30 tablet 12   clopidogrel (PLAVIX) 75 MG tablet Take 1 tablet (75 mg total) by mouth daily. 30 tablet 6   ketorolac (ACULAR) 0.5 % ophthalmic solution INSTILL 1 DROP IN LEFT EYE TWICE DAILY 5 mL 6   lansoprazole (PREVACID) 15 MG capsule Take 1 capsule (15 mg total) by mouth daily. 90 capsule 3   metFORMIN (GLUCOPHAGE) 1000 MG tablet Take 1 tablet (1,000 mg total) by mouth 2 (two) times daily with a meal. 180 tablet 3   methimazole (TAPAZOLE) 10 MG tablet Take 5 mg by mouth daily.     metoprolol tartrate (LOPRESSOR) 25 MG tablet Take 1 tablet (25 mg total) by mouth 2 (two) times daily.  180 tablet 3   nitroGLYCERIN (NITROSTAT) 0.4 MG SL tablet Place 1 tablet (0.4 mg total) under the tongue every 5 (five) minutes as needed for chest pain. 25 tablet 6   oxyCODONE-acetaminophen (PERCOCET/ROXICET) 5-325 MG tablet Take 1 tablet by mouth every 6 (six) hours as needed for moderate pain (pain score 4-6). 112 tablet 0   rosuvastatin (CRESTOR) 10 MG tablet TAKE 1 TABLET EVERY DAY 90 tablet 3   triamterene-hydrochlorothiazide (MAXZIDE-25) 37.5-25 MG tablet TAKE 1 TABLET EVERY DAY 90 tablet 3   valsartan (DIOVAN) 160 MG tablet TAKE 1 TABLET TWICE DAILY (NEED MD APPOINTMENT WITH CARDIOLOGIST) 180 tablet 3   No current facility-administered medications for this visit.     ROS:  See HPI  Physical Exam:  Incision: Right infraclavicular incision clean, dry, and intact.  No signs of infection or hematoma Extremities: Palpable radial pulses bilaterally.  Moving all extremities equally Neuro: No weakness/numbness, no slurred speech, no facial droop    Assessment/Plan:  This is a 73 y.o. female who is s/p:  -The patient recently underwent right TCAR on 1/22 for asymptomatic critical right ICA stenosis -Her right infraclavicular incision is well-healed without any signs of infection or hematoma -Bilateral upper extremities are warm and well-perfused with palpable radial pulses -She remains neurologically intact without any weakness/numbness, slurred speech, facial droop, or visual changes -Both Dr. Myra Gianotti  and I have discussed with the patient that she can discontinue her Plavix now that it has been a month since her surgery.  Ideally we would like her to remain on aspirin and Eliquis indefinitely.  We have explained to the patient that aspirin is protective of her stent.  At this time she will continue aspirin and Eliquis.  If she continues to have issues with easy bruising and does not want to take aspirin anymore, we can discontinue it after 61-months postop -She can follow-up with our  office in 6 months with carotid duplex   Loel Dubonnet, PA-C [facility] [phone]   Clinic MD: Myra Gianotti

## 2023-09-03 ENCOUNTER — Other Ambulatory Visit: Payer: Self-pay | Admitting: *Deleted

## 2023-09-03 DIAGNOSIS — I739 Peripheral vascular disease, unspecified: Secondary | ICD-10-CM

## 2023-09-12 ENCOUNTER — Other Ambulatory Visit: Payer: Self-pay | Admitting: *Deleted

## 2023-09-12 DIAGNOSIS — I6523 Occlusion and stenosis of bilateral carotid arteries: Secondary | ICD-10-CM

## 2023-09-23 ENCOUNTER — Other Ambulatory Visit: Payer: Self-pay | Admitting: Internal Medicine

## 2023-09-23 DIAGNOSIS — Z0000 Encounter for general adult medical examination without abnormal findings: Secondary | ICD-10-CM

## 2023-09-25 DIAGNOSIS — E1122 Type 2 diabetes mellitus with diabetic chronic kidney disease: Secondary | ICD-10-CM | POA: Diagnosis not present

## 2023-09-25 DIAGNOSIS — D631 Anemia in chronic kidney disease: Secondary | ICD-10-CM | POA: Diagnosis not present

## 2023-09-25 DIAGNOSIS — N1832 Chronic kidney disease, stage 3b: Secondary | ICD-10-CM | POA: Diagnosis not present

## 2023-09-25 DIAGNOSIS — I129 Hypertensive chronic kidney disease with stage 1 through stage 4 chronic kidney disease, or unspecified chronic kidney disease: Secondary | ICD-10-CM | POA: Diagnosis not present

## 2023-10-02 ENCOUNTER — Ambulatory Visit
Admission: RE | Admit: 2023-10-02 | Discharge: 2023-10-02 | Disposition: A | Discharge status: Home / Self Care | Source: Ambulatory Visit | Attending: Internal Medicine | Admitting: Internal Medicine

## 2023-10-02 DIAGNOSIS — Z1231 Encounter for screening mammogram for malignant neoplasm of breast: Secondary | ICD-10-CM | POA: Diagnosis not present

## 2023-10-02 DIAGNOSIS — Z0000 Encounter for general adult medical examination without abnormal findings: Secondary | ICD-10-CM

## 2023-10-15 ENCOUNTER — Other Ambulatory Visit: Payer: Self-pay | Admitting: Cardiovascular Disease

## 2023-11-17 DIAGNOSIS — E0590 Thyrotoxicosis, unspecified without thyrotoxic crisis or storm: Secondary | ICD-10-CM | POA: Diagnosis not present

## 2023-11-18 DIAGNOSIS — C50919 Malignant neoplasm of unspecified site of unspecified female breast: Secondary | ICD-10-CM | POA: Diagnosis not present

## 2023-11-18 DIAGNOSIS — N1832 Chronic kidney disease, stage 3b: Secondary | ICD-10-CM | POA: Diagnosis not present

## 2023-11-18 DIAGNOSIS — I6523 Occlusion and stenosis of bilateral carotid arteries: Secondary | ICD-10-CM | POA: Diagnosis not present

## 2023-11-18 DIAGNOSIS — E0590 Thyrotoxicosis, unspecified without thyrotoxic crisis or storm: Secondary | ICD-10-CM | POA: Diagnosis not present

## 2023-11-18 DIAGNOSIS — H353221 Exudative age-related macular degeneration, left eye, with active choroidal neovascularization: Secondary | ICD-10-CM | POA: Diagnosis not present

## 2023-11-18 DIAGNOSIS — E1159 Type 2 diabetes mellitus with other circulatory complications: Secondary | ICD-10-CM | POA: Diagnosis not present

## 2023-11-18 DIAGNOSIS — I480 Paroxysmal atrial fibrillation: Secondary | ICD-10-CM | POA: Diagnosis not present

## 2023-11-18 DIAGNOSIS — I129 Hypertensive chronic kidney disease with stage 1 through stage 4 chronic kidney disease, or unspecified chronic kidney disease: Secondary | ICD-10-CM | POA: Diagnosis not present

## 2023-11-27 ENCOUNTER — Other Ambulatory Visit: Payer: Self-pay | Admitting: Cardiovascular Disease

## 2024-01-04 ENCOUNTER — Other Ambulatory Visit: Payer: Self-pay | Admitting: Cardiovascular Disease

## 2024-01-04 DIAGNOSIS — I480 Paroxysmal atrial fibrillation: Secondary | ICD-10-CM

## 2024-01-06 NOTE — Telephone Encounter (Signed)
 Prescription refill request for Eliquis  received. Indication: AF Last office visit: 07/19/23  Elaine Croitoru MD Scr: 1.60 on 07/25/23  Epic Age: 73 Weight: 81.1kg  Based on above findings Eliquis  5mg  twice daily is the appropriate dose.  Refill approved.

## 2024-01-07 DIAGNOSIS — H35072 Retinal telangiectasis, left eye: Secondary | ICD-10-CM | POA: Diagnosis not present

## 2024-01-07 DIAGNOSIS — E119 Type 2 diabetes mellitus without complications: Secondary | ICD-10-CM | POA: Diagnosis not present

## 2024-01-07 DIAGNOSIS — H524 Presbyopia: Secondary | ICD-10-CM | POA: Diagnosis not present

## 2024-01-07 DIAGNOSIS — H35352 Cystoid macular degeneration, left eye: Secondary | ICD-10-CM | POA: Diagnosis not present

## 2024-01-07 DIAGNOSIS — H40013 Open angle with borderline findings, low risk, bilateral: Secondary | ICD-10-CM | POA: Diagnosis not present

## 2024-03-09 ENCOUNTER — Encounter (HOSPITAL_COMMUNITY)

## 2024-03-09 ENCOUNTER — Ambulatory Visit

## 2024-03-25 DIAGNOSIS — N1832 Chronic kidney disease, stage 3b: Secondary | ICD-10-CM | POA: Diagnosis not present

## 2024-03-25 DIAGNOSIS — D0512 Intraductal carcinoma in situ of left breast: Secondary | ICD-10-CM | POA: Diagnosis not present

## 2024-03-25 DIAGNOSIS — E1122 Type 2 diabetes mellitus with diabetic chronic kidney disease: Secondary | ICD-10-CM | POA: Diagnosis not present

## 2024-03-25 DIAGNOSIS — E119 Type 2 diabetes mellitus without complications: Secondary | ICD-10-CM | POA: Diagnosis not present

## 2024-03-25 DIAGNOSIS — E785 Hyperlipidemia, unspecified: Secondary | ICD-10-CM | POA: Diagnosis not present

## 2024-03-25 DIAGNOSIS — I129 Hypertensive chronic kidney disease with stage 1 through stage 4 chronic kidney disease, or unspecified chronic kidney disease: Secondary | ICD-10-CM | POA: Diagnosis not present

## 2024-03-25 DIAGNOSIS — Z951 Presence of aortocoronary bypass graft: Secondary | ICD-10-CM | POA: Diagnosis not present

## 2024-03-25 DIAGNOSIS — I6529 Occlusion and stenosis of unspecified carotid artery: Secondary | ICD-10-CM | POA: Diagnosis not present

## 2024-03-25 DIAGNOSIS — I701 Atherosclerosis of renal artery: Secondary | ICD-10-CM | POA: Diagnosis not present

## 2024-04-13 ENCOUNTER — Ambulatory Visit: Admitting: Physician Assistant

## 2024-04-13 ENCOUNTER — Ambulatory Visit (HOSPITAL_COMMUNITY)
Admission: RE | Admit: 2024-04-13 | Discharge: 2024-04-13 | Disposition: A | Discharge status: Home / Self Care | Source: Ambulatory Visit | Attending: Physician Assistant | Admitting: Physician Assistant

## 2024-04-13 VITALS — BP 118/73 | HR 55 | Temp 97.7°F | Wt 183.6 lb

## 2024-04-13 DIAGNOSIS — I6523 Occlusion and stenosis of bilateral carotid arteries: Secondary | ICD-10-CM

## 2024-04-13 NOTE — Progress Notes (Unsigned)
 Office Note     CC:  follow up Requesting Provider:  Janey Santos, MD  HPI: [name] is a 73 y.o. ([birth year]) female who presents for surveillance of carotid artery stenosis.  She underwent right TCAR on 07/24/2023 by Dr. Serene due to asymptomatic high-grade stenosis of the right ICA.  She also has history of transfemoral left carotid stenting on 02/12/2012 by Dr. Serene for asymptomatic high-grade stenosis.  She is taking her Eliquis  and aspirin  on a daily basis.  She denies tobacco use.  She follows regularly with her PCP for management of chronic medical conditions including hypertension, hyperlipidemia.  She is also followed for PAD by cardiology.   Past Medical History:  Diagnosis Date   Acute kidney failure    ASHD (arteriosclerotic heart disease)    native vessel   Atrial fibrillation (HCC)    Breast cancer, left (HCC)    CAD (coronary artery disease) 2004   multivessel, status post CABG x3   Carotid artery disease    stent in 2013   CKD (chronic kidney disease), stage III (HCC)    Diabetes (HCC)    GERD (gastroesophageal reflux disease)    History of tobacco abuse    Hx of adenomatous polyp of colon 07/26/2016   Hyperlipidemia    Hypertension    Hyperthyroidism    Myocardial infarction [facility]) 2002   Peripheral vascular disease    S/P popliteal artery and renal artery stents   Renovascular hypertension    S/P CABG x 3 2004   Thyroid  goiter    Tobacco abuse    Vaginal cancer (HCC) 1983    Past Surgical History:  Procedure Laterality Date   BREAST LUMPECTOMY WITH RADIOACTIVE SEED AND SENTINEL LYMPH NODE BIOPSY Left 08/08/2017   Procedure: LEFT BREAST LUMPECTOMY WITH RADIOACTIVE SEED AND SENTINEL LYMPH NODE BIOPSY;  Surgeon: Curvin Deward MOULD, MD;  Location: [facility];  Service: General;  Laterality: Left;   CARDIAC CATHETERIZATION  09/23/2002   3 vessel CAD >> CABG (Dr. FABIENE Pinion)   CARDIAC CATHETERIZATION  12/23/2003   patent LIMA, LAD,  SVG to OM1, SVG to RCA, distal disease beyond graft insertion (Dr. FABIENE Pinion)   CARDIOVERSION  09/11/2011   Procedure: CARDIOVERSION;  Surgeon: Jerel Balding, MD;  Location: [facility] OR;  Service: Cardiovascular;  Laterality: N/A;   CAROTID ANGIOGRAM Bilateral 02/12/2012   Procedure: CAROTID GERALYN;  Surgeon: Gaile LELON Serene, MD;  Location: [facility] CATH LAB;  Service: Cardiovascular;  Laterality: Bilateral;   CAROTID DOPPLER  03/02/2013   right bulb & prox ICA w/50-69% stenosis, L ICA stent open/patent   CAROTID STENT  02/12/2012   Carotid Stent Insertion, Carotid Angiogram - 9x40 Cordis Precise stent  (Dr. MICAEL Serene)   CAROTID STENT INSERTION N/A 02/12/2012   Procedure: CAROTID STENT INSERTION;  Surgeon: Gaile LELON Serene, MD;  Location: [facility] CATH LAB;  Service: Cardiovascular;  Laterality: N/A;   CORONARY ARTERY BYPASS GRAFT  09/24/2002   LIMA to LAD, SVG to Cfx, SVG to RCA (Dr. Fleeta Ochoa)   LOWER EXTREMITY ARTERIAL DOPPLER  09/30/2012   bilateral ABIS (mod arterial insuff at rest); R SFA 70-99% stenosis, L SFA 50-69% stenosis   NM MYOCAR PERF WALL MOTION  12/31/2010   persnatine - moderate perfusion defect in apical anterior/apical/basal inferolateral/mid inferolateral/apical lateral walls - EF 65% - low risk   RENAL ARTERY ANGIOPLASTY  02/11/2003   transluminal angioplasty & stent wtih 4x45mm Genesis balloon expandable stent premounted on Cordis aviatory balloon (Dr. JONELLE.  [place])   RENAL DOPPLER  09/30/2012   R prox renal artery @ stent equal/less that 60% diameter reduction; L renal artery 1-59% stenosis   TONSILLECTOMY     TRANSCAROTID ARTERY REVASCULARIZATION  Right 07/24/2023   Procedure: RIGHT TRANSCAROTID ARTERY REVASCULARIZATION;  Surgeon: Serene Gaile ORN, MD;  Location: [facility] OR;  Service: Vascular;  Laterality: Right;   TRANSTHORACIC ECHOCARDIOGRAM  07/02/2012   EF 55-65%, mild conc LVH, grade 1 diastolic dysfunction; calcicifed MV annulus, mild MR; LA mild-mod dilated; RA mod dilated;  mod TR; PA peak pressure , RVSP increased (pulm htn)    Social History   Socioeconomic History   Marital status: Married    Spouse name: Not on file   Number of children: 2   Years of education: 12   Highest education level: Not on file  Occupational History   Occupation: retired  Tobacco Use   Smoking status: Former    Current packs/day: 0.00    Average packs/day: 1 pack/day for 30.0 years (30.0 ttl pk-yrs)    Types: Cigarettes    Start date: 07/03/1971    Quit date: 07/02/2001    Years since quitting: 22.7   Smokeless tobacco: Never  Vaping Use   Vaping status: Never Used  Substance and Sexual Activity   Alcohol  use: Not Currently    Alcohol /week: 2.0 - 3.0 standard drinks of alcohol     Types: 2 - 3 Standard drinks or equivalent per week    Comment: socially   Drug use: No   Sexual activity: Not on file  Other Topics Concern   Not on file  Social History Narrative   Pt lives with her husband and son.    Social Drivers of Corporate investment banker Strain: Not on file  Food Insecurity: No Food Insecurity (07/24/2023)   Hunger Vital Sign    Worried About Running Out of Food in the Last Year: Never true    Ran Out of Food in the Last Year: Never true  Transportation Needs: No Transportation Needs (07/24/2023)   PRAPARE - Administrator, Civil Service (Medical): No    Lack of Transportation (Non-Medical): No  Physical Activity: Not on file  Stress: Not on file  Social Connections: Unknown (07/25/2023)   Social Connection and Isolation Panel    Frequency of Communication with Friends and Family: More than three times a week    Frequency of Social Gatherings with Friends and Family: Patient declined    Attends Religious Services: Patient declined    Database administrator or Organizations: Patient declined    Attends Banker Meetings: Patient declined    Marital Status: Patient declined  Intimate Partner Violence: Unknown (07/25/2023)    Humiliation, Afraid, Rape, and Kick questionnaire    Fear of Current or Ex-Partner: No    Emotionally Abused: No    Physically Abused: Not on file    Sexually Abused: No    Family History  Problem Relation Age of Onset   Hypertension Mother    Other Mother        DJD   Deep vein thrombosis Sister    Diabetes Sister    Heart disease Sister    Hyperlipidemia Sister    Hypertension Sister    Diabetes Brother    Other Brother        DJD   Hypertension Son    Hyperlipidemia Son    Colon cancer Neg Hx    Stomach cancer Neg Hx  Esophageal cancer Neg Hx    Rectal cancer Neg Hx    Liver cancer Neg Hx     Current Outpatient Medications  Medication Sig Dispense Refill   amLODipine  (NORVASC ) 10 MG tablet TAKE 1 TABLET EVERY DAY 90 tablet 2   apixaban  (ELIQUIS ) 5 MG TABS tablet Take 1 tablet by mouth twice daily 180 tablet 1   aspirin  EC 81 MG tablet Take 1 tablet (81 mg total) by mouth daily. Swallow whole. 30 tablet 12   clopidogrel  (PLAVIX ) 75 MG tablet Take 1 tablet (75 mg total) by mouth daily. 30 tablet 6   ketorolac  (ACULAR ) 0.5 % ophthalmic solution INSTILL 1 DROP IN LEFT EYE TWICE DAILY 5 mL 6   lansoprazole  (PREVACID ) 15 MG capsule Take 1 capsule (15 mg total) by mouth daily. 90 capsule 3   metFORMIN  (GLUCOPHAGE ) 1000 MG tablet Take 1 tablet (1,000 mg total) by mouth 2 (two) times daily with a meal. 180 tablet 3   methimazole  (TAPAZOLE ) 10 MG tablet Take 5 mg by mouth daily.     metoprolol  tartrate (LOPRESSOR ) 25 MG tablet Take 1 tablet by mouth twice daily 180 tablet 1   nitroGLYCERIN  (NITROSTAT ) 0.4 MG SL tablet Place 1 tablet (0.4 mg total) under the tongue every 5 (five) minutes as needed for chest pain. 25 tablet 6   oxyCODONE -acetaminophen  (PERCOCET/ROXICET) 5-325 MG tablet Take 1 tablet by mouth every 6 (six) hours as needed for moderate pain (pain score 4-6). 112 tablet 0   rosuvastatin  (CRESTOR ) 10 MG tablet TAKE 1 TABLET EVERY DAY 90 tablet 2    triamterene -hydrochlorothiazide  (MAXZIDE -25) 37.5-25 MG tablet TAKE 1 TABLET EVERY DAY 90 tablet 2   valsartan  (DIOVAN ) 160 MG tablet Take 1 tablet (160 mg total) by mouth 2 (two) times daily. 180 tablet 2   No current facility-administered medications for this visit.    No Known Allergies   REVIEW OF SYSTEMS:  Negative unless noted in HPI [X]  denotes positive finding, [ ]  denotes negative finding Cardiac  Comments:  Chest pain or chest pressure:    Shortness of breath upon exertion:    Short of breath when lying flat:    Irregular heart rhythm:        Vascular    Pain in calf, thigh, or hip brought on by ambulation:    Pain in feet at night that wakes you up from your sleep:     Blood clot in your veins:    Leg swelling:         Pulmonary    Oxygen at home:    Productive cough:     Wheezing:         Neurologic    Sudden weakness in arms or legs:     Sudden numbness in arms or legs:     Sudden onset of difficulty speaking or slurred speech:    Temporary loss of vision in one eye:     Problems with dizziness:         Gastrointestinal    Blood in stool:     Vomited blood:         Genitourinary    Burning when urinating:     Blood in urine:        Psychiatric    Major depression:         Hematologic    Bleeding problems:    Problems with blood clotting too easily:        Skin    Rashes or ulcers:  Constitutional    Fever or chills:      PHYSICAL EXAMINATION:  Vitals:   04/13/24 1448 04/13/24 1449  BP: 124/79 118/73  Pulse: 71 (!) 55  Temp: 97.7 F (36.5 C)   TempSrc: Temporal   Weight: 183 lb 9.6 oz (83.3 kg)     General:  WDWN in NAD; vital signs documented above Gait: Not observed HENT: WNL, normocephalic Pulmonary: normal non-labored breathing Cardiac: regular HR Abdomen: soft, NT, no masses Skin: without rashes Vascular Exam/Pulses: symmetrical radial pulses Extremities: without ischemic changes, without Gangrene , without  cellulitis; without open wounds;  Musculoskeletal: no muscle wasting or atrophy  Neurologic: A&O X 3; CN grossly intact Psychiatric:  The pt has Normal affect.   Non-Invasive Vascular Imaging:   Right ICA stent widely patent Left ICA stent widely patent    ASSESSMENT/PLAN:: 73 y.o. female here for follow up for surveillance of carotid artery stenosis with history of right TCAR in January of this year as well as transfemoral stenting of the left carotid in 2013  Subjectively, Ms. Marschner has been doing very well since surgery.  She has not had any strokelike symptoms.  Duplex demonstrates a widely patent right carotid stent.  Duplex also demonstrates a widely patent left carotid stent.  She will continue her aspirin  in addition to Eliquis .  We will repeat carotid duplex in 1 year.  She knows to call with any concerns.   Donnice Sender, PA-C [facility] [phone]  Clinic MD:   Serene

## 2024-05-14 ENCOUNTER — Telehealth: Payer: Self-pay | Admitting: Cardiovascular Disease

## 2024-05-14 NOTE — Telephone Encounter (Signed)
 Patient stated she will be dropping off paperwork for transportation for Dr. Tyrone signature tomorrow (11/14).

## 2024-05-15 ENCOUNTER — Encounter: Payer: Self-pay | Admitting: Cardiovascular Disease

## 2024-05-15 ENCOUNTER — Telehealth: Payer: Self-pay | Admitting: Cardiovascular Disease

## 2024-05-15 NOTE — Telephone Encounter (Signed)
 S/w the patient and made her aware that the paperwork has been completed by Dr Francyne. Informed her that she can pick it up at the Coumadin  clinic desk. She verbalized understanding.

## 2024-05-15 NOTE — Telephone Encounter (Signed)
 Patient dropped off transportation paperwork for [facility] - Patient asked for part B to be filled out.  I am placing this in Dr. Tyrone box for review.  Please call patient when paperwork is ready to be picked up.

## 2024-05-21 DIAGNOSIS — H40013 Open angle with borderline findings, low risk, bilateral: Secondary | ICD-10-CM | POA: Diagnosis not present

## 2024-06-15 ENCOUNTER — Other Ambulatory Visit: Payer: Self-pay | Admitting: Cardiovascular Disease

## 2024-06-16 ENCOUNTER — Encounter (HOSPITAL_COMMUNITY): Payer: Self-pay

## 2024-07-09 ENCOUNTER — Ambulatory Visit (HOSPITAL_COMMUNITY)

## 2024-07-14 ENCOUNTER — Ambulatory Visit (HOSPITAL_COMMUNITY)
Admission: RE | Admit: 2024-07-14 | Discharge: 2024-07-14 | Disposition: A | Discharge status: Home / Self Care | Payer: Self-pay | Source: Ambulatory Visit | Attending: Cardiovascular Disease | Admitting: Cardiovascular Disease

## 2024-07-14 DIAGNOSIS — I739 Peripheral vascular disease, unspecified: Secondary | ICD-10-CM | POA: Diagnosis not present

## 2024-07-14 LAB — VAS US ABI WITH/WO TBI
Left ABI: 0.87
Right ABI: 0.68

## 2024-07-15 ENCOUNTER — Ambulatory Visit: Payer: Self-pay | Admitting: Cardiovascular Disease

## 2024-07-22 NOTE — Telephone Encounter (Signed)
" °  Mihai Croitoru, MD 07/15/2024  2:15 PM EST     Looks like blood flow to the right lower extremity has deteriorated, but I am not sure that she is symptomatic. She just had TCAR w Dr. Serene last year and I  will ask him if he thinks she needs to see him for this. She is also due for her yearly visit with me - please schedule    Left the information above over voicemail (dpr). She is due for her yearly f/u. Would like to see if she can come for an appointment on 08/19/24 at 10 (would be an add on at the end of clinic)  Left call back number. "

## 2024-08-07 ENCOUNTER — Other Ambulatory Visit: Payer: Self-pay | Admitting: Cardiovascular Disease

## 2024-08-07 DIAGNOSIS — I480 Paroxysmal atrial fibrillation: Secondary | ICD-10-CM
